# Patient Record
Sex: Female | Born: 1945
Health system: Southern US, Community
[De-identification: ages and names within clinical notes are randomized; demographics above are authoritative.]

## PROBLEM LIST (undated history)

## (undated) DIAGNOSIS — R51 Headache: Secondary | ICD-10-CM

## (undated) DIAGNOSIS — C801 Malignant (primary) neoplasm, unspecified: Secondary | ICD-10-CM

## (undated) DIAGNOSIS — K219 Gastro-esophageal reflux disease without esophagitis: Secondary | ICD-10-CM

## (undated) DIAGNOSIS — I10 Essential (primary) hypertension: Secondary | ICD-10-CM

## (undated) DIAGNOSIS — M1990 Unspecified osteoarthritis, unspecified site: Secondary | ICD-10-CM

## (undated) DIAGNOSIS — K625 Hemorrhage of anus and rectum: Secondary | ICD-10-CM

## (undated) DIAGNOSIS — F419 Anxiety disorder, unspecified: Secondary | ICD-10-CM

## (undated) DIAGNOSIS — Z923 Personal history of irradiation: Secondary | ICD-10-CM

## (undated) HISTORY — DX: Hemorrhage of anus and rectum: K62.5

## (undated) HISTORY — PX: EYE SURGERY: SHX253

## (undated) HISTORY — PX: TUBAL LIGATION: SHX77

---

## 2006-02-24 ENCOUNTER — Encounter: Admission: RE | Admit: 2006-02-24 | Discharge: 2006-02-24 | Payer: Self-pay | Admitting: Internal Medicine

## 2006-12-18 ENCOUNTER — Encounter: Admission: RE | Admit: 2006-12-18 | Discharge: 2006-12-18 | Payer: Self-pay | Admitting: Obstetrics and Gynecology

## 2007-07-23 ENCOUNTER — Encounter: Admission: RE | Admit: 2007-07-23 | Discharge: 2007-07-23 | Payer: Self-pay | Admitting: Orthopaedic Surgery

## 2008-01-13 ENCOUNTER — Encounter: Admission: RE | Admit: 2008-01-13 | Discharge: 2008-01-13 | Payer: Self-pay | Admitting: Internal Medicine

## 2008-07-11 ENCOUNTER — Encounter: Admission: RE | Admit: 2008-07-11 | Discharge: 2008-07-11 | Payer: Self-pay | Admitting: Internal Medicine

## 2009-02-02 ENCOUNTER — Encounter: Admission: RE | Admit: 2009-02-02 | Discharge: 2009-02-02 | Payer: Self-pay | Admitting: Internal Medicine

## 2010-02-27 ENCOUNTER — Encounter: Admission: RE | Admit: 2010-02-27 | Discharge: 2010-02-27 | Payer: Self-pay | Admitting: Internal Medicine

## 2010-09-03 ENCOUNTER — Encounter: Admission: RE | Admit: 2010-09-03 | Discharge: 2010-09-03 | Payer: Self-pay | Admitting: Internal Medicine

## 2011-03-04 ENCOUNTER — Other Ambulatory Visit: Payer: Self-pay | Admitting: Obstetrics and Gynecology

## 2011-03-04 DIAGNOSIS — Z1231 Encounter for screening mammogram for malignant neoplasm of breast: Secondary | ICD-10-CM

## 2011-03-13 ENCOUNTER — Ambulatory Visit
Admission: RE | Admit: 2011-03-13 | Discharge: 2011-03-13 | Disposition: A | Discharge status: Home / Self Care | Payer: BC Managed Care – PPO | Source: Ambulatory Visit | Attending: Obstetrics and Gynecology | Admitting: Obstetrics and Gynecology

## 2011-03-13 DIAGNOSIS — Z1231 Encounter for screening mammogram for malignant neoplasm of breast: Secondary | ICD-10-CM

## 2012-02-17 ENCOUNTER — Other Ambulatory Visit: Payer: Self-pay | Admitting: Internal Medicine

## 2012-02-17 DIAGNOSIS — Z1231 Encounter for screening mammogram for malignant neoplasm of breast: Secondary | ICD-10-CM

## 2012-03-17 ENCOUNTER — Ambulatory Visit
Admission: RE | Admit: 2012-03-17 | Discharge: 2012-03-17 | Disposition: A | Discharge status: Home / Self Care | Payer: Medicare Other | Source: Ambulatory Visit | Attending: Internal Medicine | Admitting: Internal Medicine

## 2012-03-17 DIAGNOSIS — Z1231 Encounter for screening mammogram for malignant neoplasm of breast: Secondary | ICD-10-CM

## 2013-03-02 ENCOUNTER — Other Ambulatory Visit: Payer: Self-pay | Admitting: Obstetrics and Gynecology

## 2013-03-02 ENCOUNTER — Other Ambulatory Visit (HOSPITAL_COMMUNITY)
Admission: RE | Admit: 2013-03-02 | Discharge: 2013-03-02 | Disposition: A | Discharge status: Home / Self Care | Payer: PRIVATE HEALTH INSURANCE | Source: Ambulatory Visit | Attending: Obstetrics and Gynecology | Admitting: Obstetrics and Gynecology

## 2013-03-02 DIAGNOSIS — Z1151 Encounter for screening for human papillomavirus (HPV): Secondary | ICD-10-CM | POA: Insufficient documentation

## 2013-03-02 DIAGNOSIS — Z01419 Encounter for gynecological examination (general) (routine) without abnormal findings: Secondary | ICD-10-CM | POA: Insufficient documentation

## 2013-05-02 ENCOUNTER — Other Ambulatory Visit: Payer: Self-pay

## 2013-05-02 DIAGNOSIS — Z1231 Encounter for screening mammogram for malignant neoplasm of breast: Secondary | ICD-10-CM

## 2013-05-26 ENCOUNTER — Ambulatory Visit
Admission: RE | Admit: 2013-05-26 | Discharge: 2013-05-26 | Disposition: A | Discharge status: Home / Self Care | Payer: PRIVATE HEALTH INSURANCE | Source: Ambulatory Visit

## 2013-05-26 DIAGNOSIS — Z1231 Encounter for screening mammogram for malignant neoplasm of breast: Secondary | ICD-10-CM

## 2014-04-19 ENCOUNTER — Other Ambulatory Visit: Payer: Self-pay

## 2014-04-19 DIAGNOSIS — Z1231 Encounter for screening mammogram for malignant neoplasm of breast: Secondary | ICD-10-CM

## 2014-05-29 ENCOUNTER — Ambulatory Visit
Admission: RE | Admit: 2014-05-29 | Discharge: 2014-05-29 | Disposition: A | Discharge status: Home / Self Care | Payer: PRIVATE HEALTH INSURANCE | Source: Ambulatory Visit

## 2014-05-29 DIAGNOSIS — Z1231 Encounter for screening mammogram for malignant neoplasm of breast: Secondary | ICD-10-CM

## 2014-07-05 ENCOUNTER — Ambulatory Visit (INDEPENDENT_AMBULATORY_CARE_PROVIDER_SITE_OTHER): Payer: Medicare Other | Admitting: Podiatry

## 2014-07-05 ENCOUNTER — Ambulatory Visit (INDEPENDENT_AMBULATORY_CARE_PROVIDER_SITE_OTHER): Payer: Medicare Other

## 2014-07-05 ENCOUNTER — Encounter: Payer: Self-pay | Admitting: Podiatry

## 2014-07-05 VITALS — BP 165/88 | HR 66 | Resp 16

## 2014-07-05 DIAGNOSIS — R52 Pain, unspecified: Secondary | ICD-10-CM

## 2014-07-05 DIAGNOSIS — M722 Plantar fascial fibromatosis: Secondary | ICD-10-CM

## 2014-07-05 DIAGNOSIS — M79609 Pain in unspecified limb: Secondary | ICD-10-CM

## 2014-07-05 MED ORDER — TRIAMCINOLONE ACETONIDE 10 MG/ML IJ SUSP
10.0000 mg | Freq: Once | INTRAMUSCULAR | Status: AC
  Administered 2014-07-05: 10 mg

## 2014-07-05 NOTE — Progress Notes (Signed)
Subjective:     Patient ID: Susan Randolph, female   DOB: [birth year], 68 y.o.   MRN: [id number]  Foot Pain   68 year old female presents with 2 month history of heel pain right that has intensified recently. Patient states that she's going on a trip and is unable to ambulate the way she wants to   Review of Systems  All other systems reviewed and are negative.      Objective:   Physical Exam  Nursing note and vitals reviewed. Constitutional: She is oriented to person, place, and time.  Cardiovascular: Intact distal pulses.   Musculoskeletal: Normal range of motion.  Neurological: She is oriented to person, place, and time.  Skin: Skin is warm.   neurovascular status found to be intact with muscle strength adequate and range of motion subtalar and midtarsal joint within normal limits. Patient's digits are found to be well perfused and arch height was normal upon weightbearing. Patient is noted to have intense discomfort at the insertion of the plantar fascia into the heel bone right     Assessment:     Plantar fasciitis acute nature right heel    Plan:     H&P and x-ray reviewed with patient. Injected the right plantar fascia 3 mg Kenalog 5 mg Xylocaine and applied fascially brace. Instructed on physical therapy and shoe gear usage and reappoint one week

## 2014-07-05 NOTE — Patient Instructions (Signed)

## 2014-07-05 NOTE — Progress Notes (Signed)
   Subjective:    Patient ID: Susan Randolph, female    DOB: 1946/05/02, 68 y.o.   MRN: [id number]  HPI Comments: "I have heel pain"  Patient c/o aching plantar heel right for about 1 month. She does have AM pain. She is getting ready to go on a trip that requires lots of walking. She has been using callus pad to take pressure off of the heel.      Review of Systems  HENT: Positive for sinus pressure.   Musculoskeletal: Positive for arthralgias, back pain, gait problem and myalgias.  Allergic/Immunologic: Positive for environmental allergies.  All other systems reviewed and are negative.      Objective:   Physical Exam        Assessment & Plan:

## 2014-07-13 ENCOUNTER — Ambulatory Visit (INDEPENDENT_AMBULATORY_CARE_PROVIDER_SITE_OTHER): Payer: Medicare Other | Admitting: Podiatry

## 2014-07-13 ENCOUNTER — Encounter: Payer: Self-pay | Admitting: Podiatry

## 2014-07-13 VITALS — BP 141/75 | HR 73 | Resp 16

## 2014-07-13 DIAGNOSIS — M722 Plantar fascial fibromatosis: Secondary | ICD-10-CM

## 2014-07-13 MED ORDER — TRIAMCINOLONE ACETONIDE 10 MG/ML IJ SUSP
10.0000 mg | Freq: Once | INTRAMUSCULAR | Status: AC
  Administered 2014-07-13: 10 mg

## 2014-07-14 NOTE — Progress Notes (Signed)
Subjective:     Patient ID: Susan Randolph, female   DOB: Jul 06, 1946, 68 y.o.   MRN: [id number]  HPI patient states that my right heel is improving but still has a sore spot   Review of Systems     Objective:   Physical Exam Neurovascular status unchanged with pain to palpation medial fascially band right at the insertional point of the tendon into the calcaneus    Assessment:     Plan her fasciitis right with inflammation    Plan:     H&P performed and did a careful injection of the 1 painful area 3 mg Kenalog 5 mg Xylocaine and advised him reduced activity and reappoint her recheck

## 2014-09-05 ENCOUNTER — Other Ambulatory Visit: Payer: Self-pay | Admitting: Internal Medicine

## 2014-09-05 DIAGNOSIS — R319 Hematuria, unspecified: Secondary | ICD-10-CM

## 2014-09-08 ENCOUNTER — Ambulatory Visit
Admission: RE | Admit: 2014-09-08 | Discharge: 2014-09-08 | Disposition: A | Discharge status: Home / Self Care | Payer: Medicare Other | Source: Ambulatory Visit | Attending: Internal Medicine | Admitting: Internal Medicine

## 2014-09-08 DIAGNOSIS — R319 Hematuria, unspecified: Secondary | ICD-10-CM

## 2014-10-16 ENCOUNTER — Encounter: Payer: Self-pay | Admitting: Podiatry

## 2014-10-16 ENCOUNTER — Ambulatory Visit (INDEPENDENT_AMBULATORY_CARE_PROVIDER_SITE_OTHER): Payer: Medicare Other | Admitting: Podiatry

## 2014-10-16 VITALS — BP 139/77 | HR 71 | Resp 16

## 2014-10-16 DIAGNOSIS — M722 Plantar fascial fibromatosis: Secondary | ICD-10-CM

## 2014-10-16 MED ORDER — TRIAMCINOLONE ACETONIDE 10 MG/ML IJ SUSP
10.0000 mg | Freq: Once | INTRAMUSCULAR | Status: AC
  Administered 2014-10-16: 10 mg

## 2014-10-16 NOTE — Progress Notes (Signed)
Subjective:     Patient ID: Susan Randolph, female   DOB: [birth year], 68 y.o.   MRN: [id number]  HPI patient is leaving for [place] in 10 days and states that her right heel has been extremely tender   Review of Systems     Objective:   Physical Exam Neurovascular status intact muscle strength adequate with exquisite discomfort plantar aspect right heel at the insertion of the tendon into the calcaneus    Assessment:     Significant plantar fasciitis right with patient getting ready to take long trip    Plan:     Discussed immobilization and dispensed air fracture walker with all instructions on usage and reinjected the plantar fascia 3 mg Kenalog 5 mg Xylocaine and gave instructions on physical therapy. Reappoint her recheck in about 4 weeks

## 2014-11-13 ENCOUNTER — Ambulatory Visit: Payer: Medicare Other | Admitting: Podiatry

## 2014-11-21 DIAGNOSIS — M79673 Pain in unspecified foot: Secondary | ICD-10-CM

## 2015-03-12 ENCOUNTER — Other Ambulatory Visit: Payer: Self-pay | Admitting: Obstetrics and Gynecology

## 2015-03-12 ENCOUNTER — Other Ambulatory Visit (HOSPITAL_COMMUNITY)
Admission: RE | Admit: 2015-03-12 | Discharge: 2015-03-12 | Disposition: A | Discharge status: Home / Self Care | Payer: Medicare Other | Source: Ambulatory Visit | Attending: Obstetrics and Gynecology | Admitting: Obstetrics and Gynecology

## 2015-03-12 DIAGNOSIS — Z124 Encounter for screening for malignant neoplasm of cervix: Secondary | ICD-10-CM | POA: Diagnosis present

## 2015-03-14 LAB — CYTOLOGY - PAP

## 2015-04-25 ENCOUNTER — Other Ambulatory Visit: Payer: Self-pay

## 2015-04-25 DIAGNOSIS — Z1231 Encounter for screening mammogram for malignant neoplasm of breast: Secondary | ICD-10-CM

## 2015-05-31 ENCOUNTER — Ambulatory Visit
Admission: RE | Admit: 2015-05-31 | Discharge: 2015-05-31 | Disposition: A | Discharge status: Home / Self Care | Payer: Medicare Other | Source: Ambulatory Visit

## 2015-05-31 DIAGNOSIS — Z1231 Encounter for screening mammogram for malignant neoplasm of breast: Secondary | ICD-10-CM

## 2016-01-21 ENCOUNTER — Encounter (HOSPITAL_COMMUNITY): Payer: Self-pay | Admitting: Emergency Medicine

## 2016-01-21 ENCOUNTER — Emergency Department (HOSPITAL_COMMUNITY): Payer: Medicare Other

## 2016-01-21 ENCOUNTER — Emergency Department (HOSPITAL_COMMUNITY)
Admission: EM | Admit: 2016-01-21 | Discharge: 2016-01-21 | Disposition: A | Discharge status: Home / Self Care | Payer: Medicare Other | Attending: Emergency Medicine | Admitting: Emergency Medicine

## 2016-01-21 DIAGNOSIS — Z79899 Other long term (current) drug therapy: Secondary | ICD-10-CM | POA: Insufficient documentation

## 2016-01-21 DIAGNOSIS — R51 Headache: Secondary | ICD-10-CM | POA: Diagnosis not present

## 2016-01-21 DIAGNOSIS — M542 Cervicalgia: Secondary | ICD-10-CM | POA: Diagnosis present

## 2016-01-21 DIAGNOSIS — M47892 Other spondylosis, cervical region: Secondary | ICD-10-CM | POA: Insufficient documentation

## 2016-01-21 DIAGNOSIS — M47812 Spondylosis without myelopathy or radiculopathy, cervical region: Secondary | ICD-10-CM

## 2016-01-21 LAB — CBC WITH DIFFERENTIAL/PLATELET
BASOS PCT: 0 %
Basophils Absolute: 0 10*3/uL (ref 0.0–0.1)
EOS ABS: 0.1 10*3/uL (ref 0.0–0.7)
Eosinophils Relative: 1 %
HEMATOCRIT: 40.6 % (ref 36.0–46.0)
HEMOGLOBIN: 13.9 g/dL (ref 12.0–15.0)
Lymphocytes Relative: 30 %
Lymphs Abs: 2.2 10*3/uL (ref 0.7–4.0)
MCH: 29.5 pg (ref 26.0–34.0)
MCHC: 34.2 g/dL (ref 30.0–36.0)
MCV: 86.2 fL (ref 78.0–100.0)
Monocytes Absolute: 0.4 10*3/uL (ref 0.1–1.0)
Monocytes Relative: 5 %
NEUTROS ABS: 4.7 10*3/uL (ref 1.7–7.7)
NEUTROS PCT: 64 %
Platelets: 245 10*3/uL (ref 150–400)
RBC: 4.71 MIL/uL (ref 3.87–5.11)
RDW: 12.1 % (ref 11.5–15.5)
WBC: 7.3 10*3/uL (ref 4.0–10.5)

## 2016-01-21 LAB — BASIC METABOLIC PANEL
ANION GAP: 10 (ref 5–15)
BUN: 15 mg/dL (ref 6–20)
CHLORIDE: 103 mmol/L (ref 101–111)
CO2: 26 mmol/L (ref 22–32)
CREATININE: 0.79 mg/dL (ref 0.44–1.00)
Calcium: 10.1 mg/dL (ref 8.9–10.3)
GFR calc non Af Amer: >60 mL/min (ref >60)
Glucose, Bld: 98 mg/dL (ref 65–99)
POTASSIUM: 3.8 mmol/L (ref 3.5–5.1)
SODIUM: 139 mmol/L (ref 135–145)

## 2016-01-21 MED ORDER — HYDROMORPHONE HCL 1 MG/ML IJ SOLN: 0.5000 mg | Freq: Once | INTRAMUSCULAR | Status: DC

## 2016-01-21 MED ORDER — CYCLOBENZAPRINE HCL 10 MG PO TABS: 10.0000 mg | ORAL_TABLET | Freq: Two times a day (BID) | ORAL | Status: DC | PRN

## 2016-01-21 MED ORDER — PREDNISONE 10 MG (21) PO TBPK: 10.0000 mg | ORAL_TABLET | Freq: Every day | ORAL | Status: DC

## 2016-01-21 MED ORDER — OXYCODONE-ACETAMINOPHEN 5-325 MG PO TABS
1.0000 | ORAL_TABLET | Freq: Once | ORAL | Status: AC
Start: 2016-01-21 — End: 2016-01-21
  Administered 2016-01-21: 1 via ORAL
  Filled 2016-01-21: qty 1

## 2016-01-21 MED ORDER — IOPAMIDOL (ISOVUE-370) INJECTION 76%
100.0000 mL | Freq: Once | INTRAVENOUS | Status: AC | PRN
  Administered 2016-01-21: 100 mL via INTRAVENOUS

## 2016-01-21 MED ORDER — CYCLOBENZAPRINE HCL 10 MG PO TABS
10.0000 mg | ORAL_TABLET | Freq: Once | ORAL | Status: AC
  Administered 2016-01-21: 10 mg via ORAL
  Filled 2016-01-21: qty 1

## 2016-01-21 MED ORDER — OXYCODONE-ACETAMINOPHEN 5-325 MG PO TABS: 1.0000 | ORAL_TABLET | Freq: Three times a day (TID) | ORAL | Status: DC | PRN

## 2016-01-21 MED ORDER — HYDROMORPHONE HCL 1 MG/ML IJ SOLN
0.5000 mg | INTRAMUSCULAR | Status: AC | PRN
  Administered 2016-01-21 (×2): 0.5 mg via INTRAVENOUS
  Filled 2016-01-21 (×2): qty 1
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
## 2016-01-21 NOTE — Discharge Instructions (Signed)
Your imaging shows severe arthritis involving your upper neck. Given follow-up contact information listed above. Continue taking medications as needed for pain control. Return without worsening symptoms, including worsening pain, new vision or speech changes, difficulty walking, numbness or weakness of the arm or leg, or any other symptoms concerning to you.  Headache and Arthritis If you have arthritis and headaches, it is possible the two problems are related. Some headaches can be caused by arthritis in your neck (cervicogenic headaches).  Pain medicine is another possible link between arthritis and headache. If you take a lot of over-the-counter medicines for arthritis pain, you may develop the type of headache that can happen when you stop taking your over-the-counter pain reliever or lower your dose too quickly (rebound headache).  WHAT TYPES OF ARTHRITIS CAN CAUSE A HEADACHE? There are two types of arthritis, rheumatoid arthritis and osteoarthritis. Both types of arthritis can cause headaches.   Rheumatoid arthritis (RA) is an autoimmune disease that causes inflammation of your joints. When you have RA, your body's defense system (immune system) attacks the joints of your body and causes inflammation. This can lead to deformity over time.  Osteoarthritis (OA) is wear and tear caused by joint use over time. Osteoarthritis is not an inflammatory disease. Both OA and RA can cause neck pain that is felt in the head. When the pain is felt in a different location than it originates, it is called radiating or referred pain. This pain is usually felt in the back of the head.  HOW ARE HEADACHES AND ARTHRITIS RELATED? RA can affect any joint in the body, including the joints between the bones of the neck (cervical vertebrae). The neck joints most commonly affected by RA are the top two joints, between the first and second cervical vertebra. Inflammation in these joints may be felt as neck pain and head  pain. OA of the neck may be caused by gradual wear and tear or by a neck injury. Neck vertebrae may develop calcium deposits in the areas where muscle attach. Wear and tear of the vertebra may cause pressure on the nerves that leave the spinal cord. These changes can cause referred pain that may be felt as a headache. HOW ARE HEADACHES ASSOCIATED WITH ARTHRITIS DIAGNOSED?  Your health care provider may diagnose headache caused by RA if you have inflammation of vertebrae in your neck. You may have:  Blood tests to measure how much inflammation you have.  Imaging studies of your neck (MRI) to check for inflammation of cervical vertebrae.  Your health care provider may diagnose headache caused by OA if an X-ray shows:  Calcium deposits.  Bone spurs.  Narrowing of the space between neck vertebrae.  Your health care provider may diagnose rebound headache if you have a history of using over-the-counter pain relievers frequently. WHEN SHOULD I SEEK CARE FOR MY HEADACHES? Call your health care provider if:  You have more than three headaches per week.  You take an over-the-counter pain reliever almost every day.  Your headaches are getting worse and happening more often.  You have headache with fever.  You have headache with numbness, weakness, or dizziness.  You have headache with nausea or vomiting. WHAT ARE MY TREATMENT OPTIONS?  If you have headache caused by RA, treatment may include:  Over-the-counter or prescription-strength anti-inflammatory medicines.  Disease-modifying antirheumatic drugs (DMARDs). These medicines slow or stop the progression of RA.  If you have headache caused by OA, treatment may include:  Over-the-counter pain medicines.  Heat or massage.  Physical therapy.  If you have rebound headaches:  They will usually go away within several days of stopping the medicine that caused them.  You may be able to gradually reduce the amount of medicines you  take to prevent headache.  Ask your health care provider if you can take another type of medicine instead.   This information is not intended to replace advice given to you by your health care provider. Make sure you discuss any questions you have with your health care provider.   Document Released: 12/27/2003 Document Revised: 10/27/2014 Document Reviewed: 01/09/2014 Elsevier Interactive Patient Education [employer].
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
## 2016-01-21 NOTE — ED Notes (Signed)
Pt being sent by PCP c/o intermittent headache and neck pain x "a couple days."  Pt given 80mg  Depo-Medrol in office.

## 2016-05-08 ENCOUNTER — Other Ambulatory Visit: Payer: Self-pay | Admitting: Internal Medicine

## 2016-05-08 DIAGNOSIS — Z1231 Encounter for screening mammogram for malignant neoplasm of breast: Secondary | ICD-10-CM

## 2016-06-05 ENCOUNTER — Ambulatory Visit
Admission: RE | Admit: 2016-06-05 | Discharge: 2016-06-05 | Disposition: A | Discharge status: Home / Self Care | Payer: Medicare Other | Source: Ambulatory Visit | Attending: Internal Medicine | Admitting: Internal Medicine

## 2016-06-05 DIAGNOSIS — Z1231 Encounter for screening mammogram for malignant neoplasm of breast: Secondary | ICD-10-CM

## 2016-09-19 DIAGNOSIS — K625 Hemorrhage of anus and rectum: Secondary | ICD-10-CM

## 2016-09-19 HISTORY — DX: Hemorrhage of anus and rectum: K62.5

## 2016-11-07 ENCOUNTER — Other Ambulatory Visit: Payer: Self-pay | Admitting: Gastroenterology

## 2016-11-07 DIAGNOSIS — K625 Hemorrhage of anus and rectum: Secondary | ICD-10-CM

## 2016-11-11 ENCOUNTER — Other Ambulatory Visit: Payer: Self-pay | Admitting: Internal Medicine

## 2016-11-11 DIAGNOSIS — K625 Hemorrhage of anus and rectum: Secondary | ICD-10-CM

## 2016-11-13 ENCOUNTER — Ambulatory Visit
Admission: RE | Admit: 2016-11-13 | Discharge: 2016-11-13 | Disposition: A | Discharge status: Home / Self Care | Payer: Medicare Other | Source: Ambulatory Visit | Attending: Gastroenterology | Admitting: Gastroenterology

## 2016-11-13 DIAGNOSIS — K625 Hemorrhage of anus and rectum: Secondary | ICD-10-CM

## 2016-11-13 MED ORDER — IOPAMIDOL (ISOVUE-370) INJECTION 76%
75.0000 mL | Freq: Once | INTRAVENOUS | Status: AC | PRN
  Administered 2016-11-13: 75 mL via INTRAVENOUS

## 2016-12-29 DIAGNOSIS — K219 Gastro-esophageal reflux disease without esophagitis: Secondary | ICD-10-CM | POA: Insufficient documentation

## 2016-12-29 DIAGNOSIS — I10 Essential (primary) hypertension: Secondary | ICD-10-CM | POA: Insufficient documentation

## 2016-12-29 DIAGNOSIS — M19042 Primary osteoarthritis, left hand: Secondary | ICD-10-CM

## 2016-12-29 DIAGNOSIS — Z8639 Personal history of other endocrine, nutritional and metabolic disease: Secondary | ICD-10-CM | POA: Insufficient documentation

## 2016-12-29 DIAGNOSIS — Z85828 Personal history of other malignant neoplasm of skin: Secondary | ICD-10-CM | POA: Insufficient documentation

## 2016-12-29 DIAGNOSIS — M19041 Primary osteoarthritis, right hand: Secondary | ICD-10-CM | POA: Insufficient documentation

## 2016-12-29 DIAGNOSIS — G2581 Restless legs syndrome: Secondary | ICD-10-CM | POA: Insufficient documentation

## 2016-12-29 DIAGNOSIS — Z8659 Personal history of other mental and behavioral disorders: Secondary | ICD-10-CM | POA: Insufficient documentation

## 2016-12-29 DIAGNOSIS — J309 Allergic rhinitis, unspecified: Secondary | ICD-10-CM | POA: Insufficient documentation

## 2016-12-29 DIAGNOSIS — M47816 Spondylosis without myelopathy or radiculopathy, lumbar region: Secondary | ICD-10-CM | POA: Insufficient documentation

## 2016-12-29 DIAGNOSIS — M2550 Pain in unspecified joint: Secondary | ICD-10-CM | POA: Insufficient documentation

## 2016-12-29 DIAGNOSIS — M47812 Spondylosis without myelopathy or radiculopathy, cervical region: Secondary | ICD-10-CM | POA: Insufficient documentation

## 2017-01-01 ENCOUNTER — Ambulatory Visit (INDEPENDENT_AMBULATORY_CARE_PROVIDER_SITE_OTHER): Payer: Medicare Other

## 2017-01-01 ENCOUNTER — Ambulatory Visit (INDEPENDENT_AMBULATORY_CARE_PROVIDER_SITE_OTHER): Payer: Medicare Other | Admitting: Rheumatology

## 2017-01-01 ENCOUNTER — Encounter: Payer: Self-pay | Admitting: Rheumatology

## 2017-01-01 ENCOUNTER — Ambulatory Visit (INDEPENDENT_AMBULATORY_CARE_PROVIDER_SITE_OTHER): Payer: Self-pay

## 2017-01-01 VITALS — BP 156/94 | HR 71 | Resp 12 | Ht 65.0 in | Wt 147.0 lb

## 2017-01-01 DIAGNOSIS — M47812 Spondylosis without myelopathy or radiculopathy, cervical region: Secondary | ICD-10-CM

## 2017-01-01 DIAGNOSIS — Z8639 Personal history of other endocrine, nutritional and metabolic disease: Secondary | ICD-10-CM

## 2017-01-01 DIAGNOSIS — K219 Gastro-esophageal reflux disease without esophagitis: Secondary | ICD-10-CM | POA: Diagnosis not present

## 2017-01-01 DIAGNOSIS — G2581 Restless legs syndrome: Secondary | ICD-10-CM | POA: Diagnosis not present

## 2017-01-01 DIAGNOSIS — J302 Other seasonal allergic rhinitis: Secondary | ICD-10-CM | POA: Diagnosis not present

## 2017-01-01 DIAGNOSIS — G8929 Other chronic pain: Secondary | ICD-10-CM

## 2017-01-01 DIAGNOSIS — Z85828 Personal history of other malignant neoplasm of skin: Secondary | ICD-10-CM

## 2017-01-01 DIAGNOSIS — M25551 Pain in right hip: Secondary | ICD-10-CM

## 2017-01-01 DIAGNOSIS — Z8659 Personal history of other mental and behavioral disorders: Secondary | ICD-10-CM | POA: Diagnosis not present

## 2017-01-01 DIAGNOSIS — M2550 Pain in unspecified joint: Secondary | ICD-10-CM

## 2017-01-01 DIAGNOSIS — M542 Cervicalgia: Secondary | ICD-10-CM | POA: Diagnosis not present

## 2017-01-01 DIAGNOSIS — I10 Essential (primary) hypertension: Secondary | ICD-10-CM

## 2017-01-01 DIAGNOSIS — M19041 Primary osteoarthritis, right hand: Secondary | ICD-10-CM

## 2017-01-01 DIAGNOSIS — M47816 Spondylosis without myelopathy or radiculopathy, lumbar region: Secondary | ICD-10-CM

## 2017-01-01 DIAGNOSIS — M545 Low back pain: Secondary | ICD-10-CM | POA: Diagnosis not present

## 2017-01-01 DIAGNOSIS — M5030 Other cervical disc degeneration, unspecified cervical region: Secondary | ICD-10-CM

## 2017-01-01 DIAGNOSIS — M19042 Primary osteoarthritis, left hand: Secondary | ICD-10-CM
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
## 2017-01-01 NOTE — Patient Instructions (Signed)
Supplements for OA Natural anti-inflammatories  You can purchase these at [employer], [employer] or online.  . Turmeric (capsules)  . Ginger (ginger root or capsules)  . Omega 3 (Fish, flax seeds, chia seeds, walnuts, almonds)  . Tart cherry (dried or extract)   Patient should be under the care of a physician while taking these supplements. This may not be reproduced without the   permission of Dr. Bo Merino.   Trochanteric Bursitis Trochanteric bursitis is a condition that causes hip pain. Trochanteric bursitis happens when fluid-filled sacs (bursae) in the hip get irritated. Normally these sacs absorb shock and help strong bands of tissue (tendons) in your hip glide smoothly over each other and over your hip bones. What are the causes? This condition results from increased friction between the hip bones and the tendons that go over them. This condition can happen if you:  Have weak hips.  Use your hip muscles too much (overuse).  Get hit in the hip. What increases the risk? This condition is more likely to develop in:  Women.  Adults who are middle-aged or older.  People with arthritis or a spinal condition.  People with weak buttocks muscles (gluteal muscles).  People who have one leg that is shorter than the other.  People who participate in certain kinds of athletic activities, such as:  Running sports, especially long-distance running.  Contact sports, like football or martial arts.  Sports in which falls may occur, like skiing. What are the signs or symptoms? The main symptom of this condition is pain and tenderness over the point of your hip. The pain may be:  Sharp and intense.  Dull and achy.  Felt on the outside of your thigh. It may increase when you:  Lie on your side.  Walk or run.  Go up on stairs.  Sit.  Stand up after sitting.  Stand for long periods of time. How is this diagnosed? This condition may be diagnosed based  on:  Your symptoms.  Your medical history.  A physical exam.  Imaging tests, such as:  X-rays to check your bones.  An MRI or ultrasound to check your tendons and muscles. During your physical exam, your health care provider will check the movement and strength of your hip. He or she may press on the point of your hip to check for pain. How is this treated? This condition may be treated by:  Resting.  Reducing your activity.  Avoiding activities that cause pain.  Using crutches, a cane, or a walker to decrease the strain on your hip.  Taking medicine to help with swelling.  Having medicine injected into the bursae to help with swelling.  Using ice, heat, and massage therapy for pain relief.  Physical therapy exercises for strength and flexibility.  Surgery (rare). Follow these instructions at home: Activity   Rest.  Avoid activities that cause pain.  Return to your normal activities as told by your health care provider. Ask your health care provider what activities are safe for you. Managing pain, stiffness, and swelling   Take over-the-counter and prescription medicines only as told by your health care provider.  If directed, apply heat to the injured area as told by your health care provider.  Place a towel between your skin and the heat source.  Leave the heat on for 20-30 minutes.  Remove the heat if your skin turns bright red. This is especially important if you are unable to feel pain, heat, or cold. You  may have a greater risk of getting burned.  If directed, apply ice to the injured area:  Put ice in a plastic bag.  Place a towel between your skin and the bag.  Leave the ice on for 20 minutes, 2-3 times a day. General instructions   If the affected leg is one that you use for driving, ask your health care provider when it is safe to drive.  Use crutches, a cane, or a walker as told by your health care provider.  If one of your legs is shorter  than the other, get fitted for a shoe insert.  Lose weight if you are overweight. How is this prevented?  Wear supportive footwear that is appropriate for your sport.  If you have hip pain, start any new exercise or sport slowly.  Maintain physical fitness, including:  Strength.  Flexibility. Contact a health care provider if:  Your pain does not improve with 2-4 weeks. Get help right away if:  You develop severe pain.  You have a fever.  You develop increased redness over your hip.  You have a change in your bowel function or bladder function.  You cannot control the muscles in your feet. This information is not intended to replace advice given to you by your health care provider. Make sure you discuss any questions you have with your health care provider. Document Released: 11/13/2004 Document Revised: 06/11/2016 Document Reviewed: 09/21/2015 Elsevier Interactive Patient Education  2017 [employer].

## 2017-02-06 ENCOUNTER — Ambulatory Visit: Payer: Self-pay | Admitting: Rheumatology

## 2017-04-06 ENCOUNTER — Other Ambulatory Visit: Payer: Self-pay | Admitting: Obstetrics and Gynecology

## 2017-04-06 ENCOUNTER — Other Ambulatory Visit (HOSPITAL_COMMUNITY)
Admission: RE | Admit: 2017-04-06 | Discharge: 2017-04-06 | Disposition: A | Discharge status: Home / Self Care | Payer: Medicare Other | Source: Ambulatory Visit | Attending: Obstetrics and Gynecology | Admitting: Obstetrics and Gynecology

## 2017-04-06 DIAGNOSIS — Z124 Encounter for screening for malignant neoplasm of cervix: Secondary | ICD-10-CM | POA: Diagnosis present

## 2017-04-09 LAB — CYTOLOGY - PAP
Diagnosis: NEGATIVE
HPV: NOT DETECTED

## 2017-04-27 ENCOUNTER — Other Ambulatory Visit: Payer: Self-pay | Admitting: Internal Medicine

## 2017-04-27 DIAGNOSIS — Z1231 Encounter for screening mammogram for malignant neoplasm of breast: Secondary | ICD-10-CM

## 2017-06-08 ENCOUNTER — Ambulatory Visit
Admission: RE | Admit: 2017-06-08 | Discharge: 2017-06-08 | Disposition: A | Discharge status: Home / Self Care | Payer: Medicare Other | Source: Ambulatory Visit | Attending: Internal Medicine | Admitting: Internal Medicine

## 2017-06-08 DIAGNOSIS — Z1231 Encounter for screening mammogram for malignant neoplasm of breast: Secondary | ICD-10-CM

## 2017-07-22 ENCOUNTER — Other Ambulatory Visit: Payer: Self-pay | Admitting: Obstetrics and Gynecology

## 2017-07-22 DIAGNOSIS — R310 Gross hematuria: Secondary | ICD-10-CM

## 2017-07-27 ENCOUNTER — Ambulatory Visit
Admission: RE | Admit: 2017-07-27 | Discharge: 2017-07-27 | Disposition: A | Discharge status: Home / Self Care | Payer: Medicare Other | Source: Ambulatory Visit | Attending: Obstetrics and Gynecology | Admitting: Obstetrics and Gynecology

## 2017-07-27 DIAGNOSIS — R310 Gross hematuria: Secondary | ICD-10-CM

## 2017-07-27 MED ORDER — IOPAMIDOL (ISOVUE-300) INJECTION 61%
125.0000 mL | Freq: Once | INTRAVENOUS | Status: AC | PRN
  Administered 2017-07-27: 125 mL via INTRAVENOUS

## 2017-11-16 ENCOUNTER — Other Ambulatory Visit: Payer: Self-pay | Admitting: Internal Medicine

## 2017-11-16 DIAGNOSIS — M542 Cervicalgia: Secondary | ICD-10-CM

## 2017-11-18 ENCOUNTER — Ambulatory Visit
Admission: RE | Admit: 2017-11-18 | Discharge: 2017-11-18 | Disposition: A | Discharge status: Home / Self Care | Payer: Medicare Other | Source: Ambulatory Visit | Attending: Internal Medicine | Admitting: Internal Medicine

## 2017-11-18 DIAGNOSIS — M542 Cervicalgia: Secondary | ICD-10-CM

## 2017-11-23 ENCOUNTER — Other Ambulatory Visit: Payer: Self-pay | Admitting: Internal Medicine

## 2017-11-23 DIAGNOSIS — M542 Cervicalgia: Secondary | ICD-10-CM

## 2017-11-26 ENCOUNTER — Other Ambulatory Visit: Payer: Medicare Other

## 2017-12-09 ENCOUNTER — Other Ambulatory Visit: Payer: Self-pay | Admitting: Neurosurgery

## 2018-01-01 NOTE — Pre-Procedure Instructions (Signed)
Jimya M Critcher  01/01/2018      [id number] - [place], [place] - [address] AT [address] [place]. [place] [address] Phone: [phone] Fax: [phone]    Your procedure is scheduled on Monday, March 25  Report to [facility] Admitting at 6:00 A.M.  Call this number if you have problems the morning of surgery:  [phone]   Remember:  Do not eat food or drink liquids after midnight on Sun,March 24   Take these medicines the morning of surgery with A SIP OF WATER : tylenol if needed,alprazolam (xanax) if needed, zrytec if needed, famotidine (pepcid), flonase if needed, gabapentin (neurontin) if needed                 7 days prior to surgery STOP taking any Aspirin(unless otherwise instructed by your surgeon), Aleve, Naproxen, Ibuprofen, Motrin, Advil, Goody's, BC's, all herbal medications, fish oil, and all vitamins   Do not wear jewelry, make-up or nail polish.  Do not wear lotions, powders, or perfumes, or deodorant.  Do not shave 48 hours prior to surgery.  Men may shave face and neck.  Do not bring valuables to the hospital.  [facility] is not responsible for any belongings or valuables.  Contacts, dentures or bridgework may not be worn into surgery.  Leave your suitcase in the car.  After surgery it may be brought to your room.  For patients admitted to the hospital, discharge time will be determined by your treatment team.  Patients discharged the day of surgery will not be allowed to drive home.    Special instructions:  - Preparing For Surgery  Before surgery, you can play an important role. Because skin is not sterile, your skin needs to be as free of germs as possible. You can reduce the number of germs on your skin by washing with CHG (chlorahexidine gluconate) Soap before surgery.  CHG is an antiseptic cleaner which kills germs and bonds with the skin to continue killing germs even  after washing.  Please do not use if you have an allergy to CHG or antibacterial soaps. If your skin becomes reddened/irritated stop using the CHG.  Do not shave (including legs and underarms) for at least 48 hours prior to first CHG shower. It is OK to shave your face.  Please follow these instructions carefully.   1. Shower the NIGHT BEFORE SURGERY and the MORNING OF SURGERY with CHG.   2. If you chose to wash your hair, wash your hair first as usual with your normal shampoo.  3. After you shampoo, rinse your hair and body thoroughly to remove the shampoo.  4. Use CHG as you would any other liquid soap. You can apply CHG directly to the skin and wash gently with a scrungie or a clean washcloth.   5. Apply the CHG Soap to your body ONLY FROM THE NECK DOWN.  Do not use on open wounds or open sores. Avoid contact with your eyes, ears, mouth and genitals (private parts). Wash Face and genitals (private parts)  with your normal soap.  6. Wash thoroughly, paying special attention to the area where your surgery will be performed.  7. Thoroughly rinse your body with warm water from the neck down.  8. DO NOT shower/wash with your normal soap after using and rinsing off the CHG Soap.  9. Pat yourself dry with a CLEAN TOWEL.  10. Wear CLEAN PAJAMAS to bed  the night before surgery, wear comfortable clothes the morning of surgery  11. Place CLEAN SHEETS on your bed the night of your first shower and DO NOT SLEEP WITH PETS.    Day of Surgery: Do not apply any deodorants/lotions. Please wear clean clothes to the hospital/surgery center.      Please read over the following fact sheets that you were given. Coughing and Deep Breathing, MRSA Information and Surgical Site Infection Prevention

## 2018-01-04 ENCOUNTER — Other Ambulatory Visit: Payer: Self-pay

## 2018-01-04 ENCOUNTER — Encounter (HOSPITAL_COMMUNITY)
Admission: RE | Admit: 2018-01-04 | Discharge: 2018-01-04 | Disposition: A | Discharge status: Home / Self Care | Payer: Medicare Other | Source: Ambulatory Visit | Attending: Neurosurgery | Admitting: Neurosurgery

## 2018-01-04 ENCOUNTER — Encounter (HOSPITAL_COMMUNITY): Payer: Self-pay

## 2018-01-04 DIAGNOSIS — Z01818 Encounter for other preprocedural examination: Secondary | ICD-10-CM | POA: Insufficient documentation

## 2018-01-04 DIAGNOSIS — I10 Essential (primary) hypertension: Secondary | ICD-10-CM | POA: Insufficient documentation

## 2018-01-04 HISTORY — DX: Anxiety disorder, unspecified: F41.9

## 2018-01-04 HISTORY — DX: Headache: R51

## 2018-01-04 HISTORY — DX: Malignant (primary) neoplasm, unspecified: C80.1

## 2018-01-04 HISTORY — DX: Gastro-esophageal reflux disease without esophagitis: K21.9

## 2018-01-04 HISTORY — DX: Unspecified osteoarthritis, unspecified site: M19.90

## 2018-01-04 HISTORY — DX: Essential (primary) hypertension: I10

## 2018-01-04 LAB — BASIC METABOLIC PANEL
Anion gap: 10 (ref 5–15)
BUN: 21 mg/dL — AB (ref 6–20)
CALCIUM: 9.5 mg/dL (ref 8.9–10.3)
CO2: 23 mmol/L (ref 22–32)
Chloride: 104 mmol/L (ref 101–111)
Creatinine, Ser: 0.76 mg/dL (ref 0.44–1.00)
GFR calc Af Amer: >60 mL/min (ref >60)
GLUCOSE: 85 mg/dL (ref 65–99)
POTASSIUM: 3.7 mmol/L (ref 3.5–5.1)
SODIUM: 137 mmol/L (ref 135–145)

## 2018-01-04 LAB — CBC WITH DIFFERENTIAL/PLATELET
BASOS ABS: 0 10*3/uL (ref 0.0–0.1)
Basophils Relative: 0 %
EOS ABS: 0.1 10*3/uL (ref 0.0–0.7)
Eosinophils Relative: 2 %
HCT: 36.2 % (ref 36.0–46.0)
Hemoglobin: 11.9 g/dL — ABNORMAL LOW (ref 12.0–15.0)
LYMPHS PCT: 37 %
Lymphs Abs: 2.4 10*3/uL (ref 0.7–4.0)
MCH: 30.1 pg (ref 26.0–34.0)
MCHC: 32.9 g/dL (ref 30.0–36.0)
MCV: 91.6 fL (ref 78.0–100.0)
MONO ABS: 0.3 10*3/uL (ref 0.1–1.0)
Monocytes Relative: 5 %
Neutro Abs: 3.6 10*3/uL (ref 1.7–7.7)
Neutrophils Relative %: 56 %
Platelets: 231 10*3/uL (ref 150–400)
RBC: 3.95 MIL/uL (ref 3.87–5.11)
RDW: 12.5 % (ref 11.5–15.5)
WBC: 6.4 10*3/uL (ref 4.0–10.5)

## 2018-01-04 LAB — ABO/RH: ABO/RH(D): O NEG

## 2018-01-04 LAB — TYPE AND SCREEN
ABO/RH(D): O NEG
Antibody Screen: NEGATIVE

## 2018-01-04 LAB — SURGICAL PCR SCREEN
MRSA, PCR: NEGATIVE
STAPHYLOCOCCUS AUREUS: NEGATIVE

## 2018-01-04 NOTE — Progress Notes (Signed)
PCP - Dr. Josetta Huddle Cardiologist - patient denies  Chest x-ray - n/a EKG - 01/04/2018 Stress Test - patient denies ECHO - patient denies Cardiac Cath - patient denies  Sleep Study - patient denies  Anesthesia review: n/a  Patient denies shortness of breath, fever, cough and chest pain at PAT appointment   Patient verbalized understanding of instructions that were given to them at the PAT appointment. Patient was also instructed that they will need to review over the PAT instructions again at home before surgery.

## 2018-01-06 ENCOUNTER — Ambulatory Visit: Payer: Medicare Other | Admitting: Diagnostic Neuroimaging

## 2018-01-10 ENCOUNTER — Encounter (HOSPITAL_COMMUNITY): Payer: Self-pay | Admitting: Anesthesiology

## 2018-01-10 NOTE — Anesthesia Preprocedure Evaluation (Addendum)
Anesthesia Evaluation  Patient identified by MRN, date of birth, ID band Patient awake    Reviewed: Allergy & Precautions, NPO status , Patient's Chart, lab work & pertinent test results  Airway Mallampati: I       Dental no notable dental hx. (+) Teeth Intact   Pulmonary neg pulmonary ROS,    Pulmonary exam normal breath sounds clear to auscultation       Cardiovascular hypertension, Pt. on medications Normal cardiovascular exam Rhythm:Regular Rate:Normal     Neuro/Psych PSYCHIATRIC DISORDERS Anxiety    GI/Hepatic Neg liver ROS,   Endo/Other  negative endocrine ROS  Renal/GU negative Renal ROS  negative genitourinary   Musculoskeletal   Abdominal Normal abdominal exam  (+)   Peds  Hematology negative hematology ROS (+)   Anesthesia Other Findings   Reproductive/Obstetrics                            Anesthesia Physical Anesthesia Plan  ASA: II  Anesthesia Plan: General   Post-op Pain Management:    Induction: Intravenous  PONV Risk Score and Plan: 4 or greater and Ondansetron, Dexamethasone and Midazolam  Airway Management Planned: Oral ETT  Additional Equipment:   Intra-op Plan:   Post-operative Plan: Extubation in OR  Informed Consent: I have reviewed the patients History and Physical, chart, labs and discussed the procedure including the risks, benefits and alternatives for the proposed anesthesia with the patient or authorized representative who has indicated his/her understanding and acceptance.   Dental advisory given  Plan Discussed with: CRNA and Surgeon  Anesthesia Plan Comments:        Anesthesia Quick Evaluation

## 2018-01-11 ENCOUNTER — Encounter (HOSPITAL_COMMUNITY): Payer: Self-pay | Admitting: General Practice

## 2018-01-11 ENCOUNTER — Inpatient Hospital Stay (HOSPITAL_COMMUNITY)
Admission: RE | Admit: 2018-01-11 | Discharge: 2018-01-14 | DRG: 473 | Disposition: A | Discharge status: Home / Self Care | Payer: Medicare Other | Source: Ambulatory Visit | Attending: Neurosurgery | Admitting: Neurosurgery

## 2018-01-11 ENCOUNTER — Inpatient Hospital Stay (HOSPITAL_COMMUNITY)
Admission: RE | Disposition: A | Discharge status: Home / Self Care | Payer: Self-pay | Source: Ambulatory Visit | Attending: Neurosurgery

## 2018-01-11 ENCOUNTER — Ambulatory Visit (HOSPITAL_COMMUNITY): Payer: Medicare Other | Admitting: Anesthesiology

## 2018-01-11 ENCOUNTER — Ambulatory Visit (HOSPITAL_COMMUNITY): Payer: Medicare Other

## 2018-01-11 ENCOUNTER — Other Ambulatory Visit: Payer: Self-pay

## 2018-01-11 DIAGNOSIS — Z882 Allergy status to sulfonamides status: Secondary | ICD-10-CM

## 2018-01-11 DIAGNOSIS — F419 Anxiety disorder, unspecified: Secondary | ICD-10-CM | POA: Diagnosis present

## 2018-01-11 DIAGNOSIS — M4692 Unspecified inflammatory spondylopathy, cervical region: Secondary | ICD-10-CM | POA: Diagnosis present

## 2018-01-11 DIAGNOSIS — R51 Headache: Secondary | ICD-10-CM | POA: Diagnosis not present

## 2018-01-11 DIAGNOSIS — M5481 Occipital neuralgia: Secondary | ICD-10-CM | POA: Diagnosis present

## 2018-01-11 DIAGNOSIS — Z888 Allergy status to other drugs, medicaments and biological substances status: Secondary | ICD-10-CM

## 2018-01-11 DIAGNOSIS — R112 Nausea with vomiting, unspecified: Secondary | ICD-10-CM | POA: Diagnosis not present

## 2018-01-11 DIAGNOSIS — M47812 Spondylosis without myelopathy or radiculopathy, cervical region: Secondary | ICD-10-CM | POA: Diagnosis present

## 2018-01-11 DIAGNOSIS — M47811 Spondylosis without myelopathy or radiculopathy, occipito-atlanto-axial region: Secondary | ICD-10-CM | POA: Diagnosis present

## 2018-01-11 DIAGNOSIS — I10 Essential (primary) hypertension: Secondary | ICD-10-CM | POA: Diagnosis present

## 2018-01-11 DIAGNOSIS — M479 Spondylosis, unspecified: Secondary | ICD-10-CM | POA: Diagnosis present

## 2018-01-11 DIAGNOSIS — Z79899 Other long term (current) drug therapy: Secondary | ICD-10-CM

## 2018-01-11 DIAGNOSIS — Z419 Encounter for procedure for purposes other than remedying health state, unspecified: Secondary | ICD-10-CM

## 2018-01-11 DIAGNOSIS — K219 Gastro-esophageal reflux disease without esophagitis: Secondary | ICD-10-CM | POA: Diagnosis present

## 2018-01-11 HISTORY — PX: POSTERIOR CERVICAL FUSION/FORAMINOTOMY: SHX5038

## 2018-01-11 SURGERY — POSTERIOR CERVICAL FUSION/FORAMINOTOMY LEVEL 1
Anesthesia: General | Site: Spine Cervical

## 2018-01-11 MED ORDER — HYDROMORPHONE HCL 1 MG/ML IJ SOLN
INTRAMUSCULAR | Status: AC
  Administered 2018-01-11: 0.5 mg via INTRAVENOUS
  Filled 2018-01-11: qty 1

## 2018-01-11 MED ORDER — FENTANYL CITRATE (PF) 250 MCG/5ML IJ SOLN
INTRAMUSCULAR | Status: AC
  Filled 2018-01-11: qty 5

## 2018-01-11 MED ORDER — CHLORHEXIDINE GLUCONATE CLOTH 2 % EX PADS: 6.0000 | MEDICATED_PAD | Freq: Once | CUTANEOUS | Status: DC

## 2018-01-11 MED ORDER — ROCURONIUM BROMIDE 100 MG/10ML IV SOLN
INTRAVENOUS | Status: DC | PRN
  Administered 2018-01-11: 40 mg via INTRAVENOUS
  Administered 2018-01-11: 20 mg via INTRAVENOUS

## 2018-01-11 MED ORDER — ACETAMINOPHEN 325 MG PO TABS
650.0000 mg | ORAL_TABLET | ORAL | Status: DC | PRN
  Administered 2018-01-11 – 2018-01-14 (×3): 650 mg via ORAL
  Filled 2018-01-11 (×3): qty 2

## 2018-01-11 MED ORDER — HEMOSTATIC AGENTS (NO CHARGE) OPTIME
TOPICAL | Status: DC | PRN
  Administered 2018-01-11: 1 via TOPICAL

## 2018-01-11 MED ORDER — PROPOFOL 10 MG/ML IV BOLUS
INTRAVENOUS | Status: AC
  Filled 2018-01-11: qty 20

## 2018-01-11 MED ORDER — MIDAZOLAM HCL 2 MG/2ML IJ SOLN
INTRAMUSCULAR | Status: AC
  Filled 2018-01-11: qty 2

## 2018-01-11 MED ORDER — LORATADINE 10 MG PO TABS
10.0000 mg | ORAL_TABLET | Freq: Every day | ORAL | Status: DC
  Administered 2018-01-13 – 2018-01-14 (×3): 10 mg via ORAL
  Filled 2018-01-11 (×3): qty 1

## 2018-01-11 MED ORDER — VANCOMYCIN HCL 1000 MG IV SOLR
INTRAVENOUS | Status: AC
  Filled 2018-01-11: qty 1000

## 2018-01-11 MED ORDER — ONDANSETRON HCL 4 MG/2ML IJ SOLN
INTRAMUSCULAR | Status: AC
  Filled 2018-01-11: qty 2

## 2018-01-11 MED ORDER — BACITRACIN 50000 UNITS IM SOLR
INTRAMUSCULAR | Status: DC | PRN
  Administered 2018-01-11: 500 mL

## 2018-01-11 MED ORDER — LIDOCAINE HCL (CARDIAC) 20 MG/ML IV SOLN
INTRAVENOUS | Status: AC
  Filled 2018-01-11: qty 5

## 2018-01-11 MED ORDER — 0.9 % SODIUM CHLORIDE (POUR BTL) OPTIME
TOPICAL | Status: DC | PRN
  Administered 2018-01-11: 1000 mL

## 2018-01-11 MED ORDER — HYDROCODONE-ACETAMINOPHEN 10-325 MG PO TABS
2.0000 | ORAL_TABLET | ORAL | Status: DC | PRN
  Administered 2018-01-11 – 2018-01-12 (×4): 2 via ORAL
  Filled 2018-01-11 (×4): qty 2

## 2018-01-11 MED ORDER — SODIUM CHLORIDE 0.9 % IV SOLN: 250.0000 mL | INTRAVENOUS | Status: DC

## 2018-01-11 MED ORDER — MEPERIDINE HCL 50 MG/ML IJ SOLN: 6.2500 mg | INTRAMUSCULAR | Status: DC | PRN

## 2018-01-11 MED ORDER — PHENOL 1.4 % MT LIQD: 1.0000 | OROMUCOSAL | Status: DC | PRN

## 2018-01-11 MED ORDER — PROMETHAZINE HCL 25 MG/ML IJ SOLN: 6.2500 mg | INTRAMUSCULAR | Status: DC | PRN

## 2018-01-11 MED ORDER — THROMBIN (RECOMBINANT) 5000 UNITS EX SOLR
CUTANEOUS | Status: DC | PRN
Start: 2018-01-11 — End: 2018-01-11
  Administered 2018-01-11 (×2): 5000 [IU] via TOPICAL

## 2018-01-11 MED ORDER — LACTATED RINGERS IV SOLN
INTRAVENOUS | Status: DC | PRN
  Administered 2018-01-11: 08:00:00 via INTRAVENOUS

## 2018-01-11 MED ORDER — HYDRALAZINE HCL 20 MG/ML IJ SOLN
5.0000 mg | Freq: Once | INTRAMUSCULAR | Status: AC
  Administered 2018-01-11: 5 mg via INTRAVENOUS

## 2018-01-11 MED ORDER — ALPRAZOLAM 0.25 MG PO TABS
0.2500 mg | ORAL_TABLET | Freq: Three times a day (TID) | ORAL | Status: DC | PRN
  Administered 2018-01-13: 0.25 mg via ORAL
  Administered 2018-01-14: 0.5 mg via ORAL
  Filled 2018-01-11: qty 2
  Filled 2018-01-11: qty 1

## 2018-01-11 MED ORDER — BACITRACIN ZINC 500 UNIT/GM EX OINT
TOPICAL_OINTMENT | CUTANEOUS | Status: AC
  Filled 2018-01-11: qty 28.35

## 2018-01-11 MED ORDER — MENTHOL 3 MG MT LOZG
1.0000 | LOZENGE | OROMUCOSAL | Status: DC | PRN
  Filled 2018-01-11: qty 9

## 2018-01-11 MED ORDER — GUAIFENESIN ER 600 MG PO TB12
1200.0000 mg | ORAL_TABLET | Freq: Two times a day (BID) | ORAL | Status: DC | PRN
  Filled 2018-01-11: qty 2

## 2018-01-11 MED ORDER — THROMBIN 5000 UNITS EX SOLR
CUTANEOUS | Status: AC
  Filled 2018-01-11: qty 15000

## 2018-01-11 MED ORDER — CEFAZOLIN SODIUM-DEXTROSE 2-4 GM/100ML-% IV SOLN
INTRAVENOUS | Status: AC
  Filled 2018-01-11: qty 100

## 2018-01-11 MED ORDER — MIDAZOLAM HCL 5 MG/5ML IJ SOLN
INTRAMUSCULAR | Status: DC | PRN
  Administered 2018-01-11 (×2): 1 mg via INTRAVENOUS

## 2018-01-11 MED ORDER — SUGAMMADEX SODIUM 200 MG/2ML IV SOLN
INTRAVENOUS | Status: DC | PRN
  Administered 2018-01-11: 150 mg via INTRAVENOUS

## 2018-01-11 MED ORDER — KETOROLAC TROMETHAMINE 30 MG/ML IJ SOLN: 30.0000 mg | Freq: Once | INTRAMUSCULAR | Status: DC | PRN

## 2018-01-11 MED ORDER — BUPIVACAINE HCL (PF) 0.25 % IJ SOLN
INTRAMUSCULAR | Status: AC
  Filled 2018-01-11: qty 30

## 2018-01-11 MED ORDER — ACETAMINOPHEN 650 MG RE SUPP: 650.0000 mg | RECTAL | Status: DC | PRN

## 2018-01-11 MED ORDER — CEFAZOLIN SODIUM-DEXTROSE 1-4 GM/50ML-% IV SOLN
1.0000 g | Freq: Three times a day (TID) | INTRAVENOUS | Status: AC
  Administered 2018-01-11 – 2018-01-12 (×2): 1 g via INTRAVENOUS
  Filled 2018-01-11 (×2): qty 50

## 2018-01-11 MED ORDER — HYDROCODONE-ACETAMINOPHEN 5-325 MG PO TABS: 1.0000 | ORAL_TABLET | ORAL | Status: DC | PRN

## 2018-01-11 MED ORDER — CEFAZOLIN SODIUM-DEXTROSE 2-4 GM/100ML-% IV SOLN
2.0000 g | INTRAVENOUS | Status: AC
  Administered 2018-01-11: 2 g via INTRAVENOUS

## 2018-01-11 MED ORDER — LATANOPROST 0.005 % OP SOLN
1.0000 [drp] | Freq: Every day | OPHTHALMIC | Status: DC
  Administered 2018-01-11 – 2018-01-13 (×3): 1 [drp] via OPHTHALMIC
  Filled 2018-01-11: qty 2.5

## 2018-01-11 MED ORDER — ADULT MULTIVITAMIN W/MINERALS CH: 1.0000 | ORAL_TABLET | Freq: Every day | ORAL | Status: DC

## 2018-01-11 MED ORDER — HYDROMORPHONE HCL 1 MG/ML IJ SOLN
1.0000 mg | INTRAMUSCULAR | Status: DC | PRN
  Administered 2018-01-13: 1 mg via INTRAVENOUS
  Filled 2018-01-11: qty 1

## 2018-01-11 MED ORDER — ONDANSETRON HCL 4 MG PO TABS
4.0000 mg | ORAL_TABLET | Freq: Four times a day (QID) | ORAL | Status: DC | PRN
  Administered 2018-01-13: 4 mg via ORAL
  Filled 2018-01-11: qty 1

## 2018-01-11 MED ORDER — VITAMIN D 1000 UNITS PO TABS
2000.0000 [IU] | ORAL_TABLET | Freq: Every day | ORAL | Status: DC
  Filled 2018-01-11 (×2): qty 2

## 2018-01-11 MED ORDER — FAMOTIDINE 20 MG PO TABS
20.0000 mg | ORAL_TABLET | Freq: Every day | ORAL | Status: DC
  Administered 2018-01-11 – 2018-01-14 (×4): 20 mg via ORAL
  Filled 2018-01-11 (×4): qty 1

## 2018-01-11 MED ORDER — DEXAMETHASONE SODIUM PHOSPHATE 10 MG/ML IJ SOLN
INTRAMUSCULAR | Status: AC
  Filled 2018-01-11: qty 1

## 2018-01-11 MED ORDER — PHENYLEPHRINE HCL 10 MG/ML IJ SOLN
INTRAMUSCULAR | Status: DC | PRN
  Administered 2018-01-11: 40 ug via INTRAVENOUS

## 2018-01-11 MED ORDER — LISINOPRIL 10 MG PO TABS
10.0000 mg | ORAL_TABLET | Freq: Every day | ORAL | Status: DC
  Administered 2018-01-11 – 2018-01-13 (×2): 10 mg via ORAL
  Filled 2018-01-11 (×2): qty 1

## 2018-01-11 MED ORDER — FENTANYL CITRATE (PF) 100 MCG/2ML IJ SOLN
INTRAMUSCULAR | Status: DC | PRN
  Administered 2018-01-11 (×2): 150 ug via INTRAVENOUS
  Administered 2018-01-11 (×2): 100 ug via INTRAVENOUS

## 2018-01-11 MED ORDER — PROPOFOL 10 MG/ML IV BOLUS
INTRAVENOUS | Status: DC | PRN
  Administered 2018-01-11: 150 mg via INTRAVENOUS
  Administered 2018-01-11: 50 mg via INTRAVENOUS

## 2018-01-11 MED ORDER — THROMBIN 5000 UNITS EX SOLR
CUTANEOUS | Status: AC
  Filled 2018-01-11: qty 5000

## 2018-01-11 MED ORDER — SODIUM CHLORIDE 0.9% FLUSH
3.0000 mL | INTRAVENOUS | Status: DC | PRN
Start: 2018-01-11 — End: 2018-01-11

## 2018-01-11 MED ORDER — LACTATED RINGERS IV SOLN
INTRAVENOUS | Status: DC
  Administered 2018-01-11 (×2): via INTRAVENOUS

## 2018-01-11 MED ORDER — GABAPENTIN 300 MG PO CAPS: 300.0000 mg | ORAL_CAPSULE | Freq: Every evening | ORAL | Status: DC | PRN

## 2018-01-11 MED ORDER — DEXTROSE 5 % IV SOLN
INTRAVENOUS | Status: DC | PRN
  Administered 2018-01-11: 20 ug/min via INTRAVENOUS

## 2018-01-11 MED ORDER — ROCURONIUM BROMIDE 10 MG/ML (PF) SYRINGE
PREFILLED_SYRINGE | INTRAVENOUS | Status: AC
  Filled 2018-01-11: qty 5

## 2018-01-11 MED ORDER — VANCOMYCIN HCL 1000 MG IV SOLR
INTRAVENOUS | Status: DC | PRN
  Administered 2018-01-11: 1000 mg via TOPICAL

## 2018-01-11 MED ORDER — LISINOPRIL-HYDROCHLOROTHIAZIDE 10-12.5 MG PO TABS: 1.0000 | ORAL_TABLET | Freq: Every day | ORAL | Status: DC

## 2018-01-11 MED ORDER — HYDROCHLOROTHIAZIDE 12.5 MG PO CAPS
12.5000 mg | ORAL_CAPSULE | Freq: Every day | ORAL | Status: DC
  Administered 2018-01-11 – 2018-01-13 (×2): 12.5 mg via ORAL
  Filled 2018-01-11 (×2): qty 1

## 2018-01-11 MED ORDER — FLUTICASONE PROPIONATE 50 MCG/ACT NA SUSP
2.0000 | Freq: Every day | NASAL | Status: DC | PRN
  Filled 2018-01-11: qty 16

## 2018-01-11 MED ORDER — SODIUM CHLORIDE 0.9% FLUSH: 3.0000 mL | Freq: Two times a day (BID) | INTRAVENOUS | Status: DC

## 2018-01-11 MED ORDER — ONDANSETRON HCL 4 MG/2ML IJ SOLN
4.0000 mg | Freq: Four times a day (QID) | INTRAMUSCULAR | Status: DC | PRN
  Administered 2018-01-13: 4 mg via INTRAVENOUS
  Filled 2018-01-11: qty 2

## 2018-01-11 MED ORDER — ALIVE WOMENS 50+ PO TABS: 1.0000 | ORAL_TABLET | Freq: Every day | ORAL | Status: DC

## 2018-01-11 MED ORDER — DEXAMETHASONE SODIUM PHOSPHATE 10 MG/ML IJ SOLN
10.0000 mg | INTRAMUSCULAR | Status: AC
  Administered 2018-01-11: 10 mg via INTRAVENOUS

## 2018-01-11 MED ORDER — HYDRALAZINE HCL 20 MG/ML IJ SOLN
INTRAMUSCULAR | Status: AC
  Filled 2018-01-11: qty 1

## 2018-01-11 MED ORDER — THROMBIN (RECOMBINANT) 5000 UNITS EX SOLR
OROMUCOSAL | Status: DC | PRN
  Administered 2018-01-11 (×2): 5 mL via TOPICAL

## 2018-01-11 MED ORDER — HYDROMORPHONE HCL 1 MG/ML IJ SOLN
0.2500 mg | INTRAMUSCULAR | Status: DC | PRN
  Administered 2018-01-11 (×4): 0.5 mg via INTRAVENOUS

## 2018-01-11 MED ORDER — LIDOCAINE HCL (CARDIAC) 20 MG/ML IV SOLN
INTRAVENOUS | Status: DC | PRN
  Administered 2018-01-11: 100 mg via INTRAVENOUS

## 2018-01-11 MED ORDER — SUCCINYLCHOLINE CHLORIDE 20 MG/ML IJ SOLN
INTRAMUSCULAR | Status: AC
  Filled 2018-01-11: qty 1

## 2018-01-11 MED ORDER — BACITRACIN ZINC 500 UNIT/GM EX OINT
TOPICAL_OINTMENT | CUTANEOUS | Status: DC | PRN
  Administered 2018-01-11: 1 via TOPICAL

## 2018-01-11 MED ORDER — CYCLOBENZAPRINE HCL 10 MG PO TABS
10.0000 mg | ORAL_TABLET | Freq: Three times a day (TID) | ORAL | Status: DC | PRN
  Administered 2018-01-11 – 2018-01-13 (×4): 10 mg via ORAL
  Filled 2018-01-11 (×4): qty 1

## 2018-01-11 MED ORDER — SUGAMMADEX SODIUM 200 MG/2ML IV SOLN
INTRAVENOUS | Status: AC
  Filled 2018-01-11: qty 2

## 2018-01-11 SURGICAL SUPPLY — 76 items
ADH SKN CLS APL DERMABOND .7 (GAUZE/BANDAGES/DRESSINGS) ×2
APL SKNCLS STERI-STRIP NONHPOA (GAUZE/BANDAGES/DRESSINGS) ×2
Atlas Single Cable with Integral Crimp ×1 IMPLANT
BAG DECANTER FOR FLEXI CONT (MISCELLANEOUS) ×3 IMPLANT
BENZOIN TINCTURE PRP APPL 2/3 (GAUZE/BANDAGES/DRESSINGS) ×6 IMPLANT
BIT DRILL 2.4X (BIT) IMPLANT
BIT DRL 2.4X (BIT) ×1
BLADE CLIPPER SURG (BLADE) ×2 IMPLANT
BUR MATCHSTICK NEURO 3.0 LAGG (BURR) ×3 IMPLANT
CABLE SNG STERILE W/CRIMP (MISCELLANEOUS) ×2 IMPLANT
CANISTER SUCT 3000ML PPV (MISCELLANEOUS) ×3 IMPLANT
CARTRIDGE OIL MAESTRO DRILL (MISCELLANEOUS) ×1 IMPLANT
CLOSURE WOUND 1/2 X4 (GAUZE/BANDAGES/DRESSINGS) ×2
CONT SPEC 4OZ CLIKSEAL STRL BL (MISCELLANEOUS) ×2 IMPLANT
DERMABOND ADVANCED (GAUZE/BANDAGES/DRESSINGS) ×4
DERMABOND ADVANCED .7 DNX12 (GAUZE/BANDAGES/DRESSINGS) ×1 IMPLANT
DIFFUSER DRILL AIR PNEUMATIC (MISCELLANEOUS) ×3 IMPLANT
DRAPE C-ARM 42X72 X-RAY (DRAPES) ×6 IMPLANT
DRAPE INCISE IOBAN 66X45 STRL (DRAPES) ×2 IMPLANT
DRAPE LAPAROTOMY 100X72 PEDS (DRAPES) ×3 IMPLANT
DRAPE POUCH INSTRU U-SHP 10X18 (DRAPES) ×2 IMPLANT
DRILL BIT (BIT) ×3
DRSG OPSITE POSTOP 3X4 (GAUZE/BANDAGES/DRESSINGS) ×2 IMPLANT
DRSG OPSITE POSTOP 4X6 (GAUZE/BANDAGES/DRESSINGS) ×2 IMPLANT
DURAPREP 26ML APPLICATOR (WOUND CARE) ×5 IMPLANT
ELECT REM PT RETURN 9FT ADLT (ELECTROSURGICAL) ×3
ELECTRODE REM PT RTRN 9FT ADLT (ELECTROSURGICAL) ×1 IMPLANT
EVACUATOR 1/8 PVC DRAIN (DRAIN) IMPLANT
GAUZE SPONGE 4X4 12PLY STRL (GAUZE/BANDAGES/DRESSINGS) ×3 IMPLANT
GAUZE SPONGE 4X4 16PLY XRAY LF (GAUZE/BANDAGES/DRESSINGS) IMPLANT
GLOVE BIO SURGEON STRL SZ 6.5 (GLOVE) ×5 IMPLANT
GLOVE BIO SURGEONS STRL SZ 6.5 (GLOVE) ×5
GLOVE BIOGEL PI IND STRL 6.5 (GLOVE) IMPLANT
GLOVE BIOGEL PI IND STRL 7.5 (GLOVE) IMPLANT
GLOVE BIOGEL PI INDICATOR 6.5 (GLOVE) ×8
GLOVE BIOGEL PI INDICATOR 7.5 (GLOVE) ×4
GLOVE ECLIPSE 7.0 STRL STRAW (GLOVE) ×2 IMPLANT
GLOVE ECLIPSE 9.0 STRL (GLOVE) ×9 IMPLANT
GLOVE EXAM NITRILE LRG STRL (GLOVE) IMPLANT
GLOVE EXAM NITRILE XL STR (GLOVE) IMPLANT
GLOVE EXAM NITRILE XS STR PU (GLOVE) IMPLANT
GLOVE SURG SS PI 6.5 STRL IVOR (GLOVE) IMPLANT
GOWN STRL REUS W/ TWL LRG LVL3 (GOWN DISPOSABLE) IMPLANT
GOWN STRL REUS W/ TWL XL LVL3 (GOWN DISPOSABLE) ×1 IMPLANT
GOWN STRL REUS W/TWL 2XL LVL3 (GOWN DISPOSABLE) IMPLANT
GOWN STRL REUS W/TWL LRG LVL3 (GOWN DISPOSABLE) ×9
GOWN STRL REUS W/TWL XL LVL3 (GOWN DISPOSABLE) ×6
HEMOSTAT POWDER KIT SURGIFOAM (HEMOSTASIS) ×4 IMPLANT
KIT BASIN OR (CUSTOM PROCEDURE TRAY) ×3 IMPLANT
KIT ROOM TURNOVER OR (KITS) ×3 IMPLANT
NDL SPNL 22GX3.5 QUINCKE BK (NEEDLE) ×1 IMPLANT
NEEDLE HYPO 22GX1.5 SAFETY (NEEDLE) ×3 IMPLANT
NEEDLE SPNL 22GX3.5 QUINCKE BK (NEEDLE) ×3 IMPLANT
NS IRRIG 1000ML POUR BTL (IV SOLUTION) ×3 IMPLANT
OIL CARTRIDGE MAESTRO DRILL (MISCELLANEOUS) ×3
PACK LAMINECTOMY NEURO (CUSTOM PROCEDURE TRAY) ×3 IMPLANT
PAD ARMBOARD 7.5X6 YLW CONV (MISCELLANEOUS) ×9 IMPLANT
PIN MAYFIELD SKULL DISP (PIN) ×3 IMPLANT
ROD VERTEX SPINAL (Rod) ×6 IMPLANT
ROD VERTEX SPN 30X3.5XPCUT NS (Rod) IMPLANT
SCREW 3.5X16MM (Screw) ×6 IMPLANT
SCREW BN 16X3.5XMA NS SPNE (Screw) IMPLANT
SCREW SET M6 (Screw) ×8 IMPLANT
SCREW VERTEX PT 3.5X30 (Screw) ×4 IMPLANT
SPONGE LAP 4X18 X RAY DECT (DISPOSABLE) IMPLANT
SPONGE SURGIFOAM ABS GEL SZ50 (HEMOSTASIS) ×3 IMPLANT
STAPLER SKIN PROX WIDE 3.9 (STAPLE) ×2 IMPLANT
STRIP CLOSURE SKIN 1/2X4 (GAUZE/BANDAGES/DRESSINGS) ×3 IMPLANT
SUT VIC AB 0 CT1 18XCR BRD8 (SUTURE) ×1 IMPLANT
SUT VIC AB 0 CT1 8-18 (SUTURE) ×3
SUT VIC AB 2-0 CT1 18 (SUTURE) ×3 IMPLANT
SUT VIC AB 3-0 SH 8-18 (SUTURE) ×5 IMPLANT
TAPE CLOTH 4X10 WHT NS (GAUZE/BANDAGES/DRESSINGS) ×1 IMPLANT
TOWEL GREEN STERILE (TOWEL DISPOSABLE) ×3 IMPLANT
TOWEL GREEN STERILE FF (TOWEL DISPOSABLE) ×3 IMPLANT
WATER STERILE IRR 1000ML POUR (IV SOLUTION) ×3 IMPLANT

## 2018-01-11 NOTE — H&P (Signed)
Susan Randolph is an 72 y.o. female.   Chief Complaint: Neck pain HPI: 73 year old female with chronic and progressive neck pain with associated suboccipital neuralgia failing conservative management.  Workup demonstrates evidence of marked facet arthropathy at C1-C2.  Patient is failed injections therapy and medical management.  She presents now for C1-C2 posterior cervical fusion with instrumentation and iliac crest autografting.  Past Medical History:  Diagnosis Date  . Anxiety   . Arthritis   . Cancer ([place])    basal cell carcinoma  . GERD (gastroesophageal reflux disease)   . Headache   . Hypertension   . Rectal bleed 09/2016    Past Surgical History:  Procedure Laterality Date  . EYE SURGERY     Corrected drooping eye lids bilateral  . TUBAL LIGATION      Family History  Problem Relation Age of Onset  . Cancer Mother        breast cancer/endometrial cancer  . Hypertension Mother   . Breast cancer Mother   . Hypertension Father   . Cancer Father        Glioblastoma  . Arthritis Father   . Rheum arthritis Maternal Uncle    Social History:  reports that she has never smoked. She has never used smokeless tobacco. She reports that she drinks alcohol. She reports that she does not use drugs.  Allergies:  Allergies  Allergen Reactions  . Sulfa Antibiotics Other (See Comments)    Doesn't remember, happened in childhood  . Amitriptyline Other (See Comments)    tremors    Medications Prior to Admission  Medication Sig Dispense Refill  . acetaminophen (TYLENOL) 500 MG tablet Take 500 mg by mouth 2 (two) times daily as needed for mild pain.     Marland Kitchen ALPRAZolam (XANAX) 0.5 MG tablet Take 0.25-0.5 mg by mouth 3 (three) times daily as needed for anxiety.    . bimatoprost (LUMIGAN) 0.01 % SOLN Place 1 drop into both eyes at bedtime.     . cetirizine (ZYRTEC) 10 MG tablet Take 10 mg by mouth daily as needed for allergies.    . famotidine (PEPCID) 20 MG tablet Take 20 mg by  mouth daily.  1  . fluticasone (FLONASE) 50 MCG/ACT nasal spray Place 2 sprays into both nostrils daily as needed for allergies or rhinitis.    Marland Kitchen gabapentin (NEURONTIN) 300 MG capsule Take 1 capsule by mouth at bedtime as needed for pain.  3  . Guaifenesin (MUCINEX MAXIMUM STRENGTH) 1200 MG TB12 Take 2 tablets by mouth 2 (two) times daily as needed (cold symptoms).    Marland Kitchen lisinopril-hydrochlorothiazide (PRINZIDE,ZESTORETIC) 10-12.5 MG tablet Take 1 tablet by mouth daily.  9  . Multiple Vitamins-Minerals (ALIVE WOMENS 50+) TABS Take 1 tablet by mouth daily.    . Cholecalciferol (VITAMIN D3) 2000 units capsule Take 2,000 Units by mouth daily.       No results found for this or any previous visit (from the past 48 hour(s)). No results found.  Pertinent items noted in HPI and remainder of comprehensive ROS otherwise negative.  Blood pressure (!) 155/83, pulse 68, temperature 98.4 F (36.9 C), temperature source Oral, resp. rate 20, height 5\' 5"  (1.651 m), weight 70.3 kg (155 lb), SpO2 96 %.  Patient is awake and alert.  She is oriented and appropriate.  Cranial nerve function is intact.  Speech is fluent.  Judgment insight are intact.  Motor examination extremities normal.  Sensory examination nonfocal.  Deep tender mixes normal active.  No  visible long track signs.  Gait and posture normal.  Examination head ears eyes and throat is unremarkable her chest and abdomen are benign.  Extremities are free from injury deformity.  Upper cervical spine with some mild tenderness and decreased range of motion. Assessment/Plan C1-C2 arthropathy with intractable neck pain and occipital neuralgia.  Plan C1-C2 posterior cervical fusion with interspinous wiring and iliac crest autografting with lateral mass screw instrumentation.  Risks and benefits of been explained.  Patient wishes to proceed.  Mallie Mussel A Kadence Mikkelson 01/11/2018, 7:51 AM

## 2018-01-11 NOTE — Progress Notes (Signed)
Orthopedic Tech Progress Note Patient Details:  Susan Randolph 04/01/1946 [id number]  Ortho Devices Type of Ortho Device: Soft collar Ortho Device/Splint Location: neck Ortho Device/Splint Interventions: Application   Post Interventions Patient Tolerated: Well Instructions Provided: Care of device   [name] 01/11/2018, 1:18 PM

## 2018-01-11 NOTE — Brief Op Note (Signed)
01/11/2018  10:37 AM  PATIENT:  Susan Randolph  72 y.o. female  PRE-OPERATIVE DIAGNOSIS:  stenosis  POST-OPERATIVE DIAGNOSIS:  stenosis  PROCEDURE:  Procedure(s): Posterior Cervical Fusion with lateral mass fixation - Cervical one-Cervical two with Iliac Crest bone graft (N/A)  SURGEON:  Surgeon(s) and Role:    * Earnie Larsson, MD - Primary    * Consuella Lose, MD - Assisting  PHYSICIAN ASSISTANT:   ASSISTANTS:    ANESTHESIA:   general  EBL:  500 mL   BLOOD ADMINISTERED:none  DRAINS: none   LOCAL MEDICATIONS USED:  NONE  SPECIMEN:  No Specimen  DISPOSITION OF SPECIMEN:  N/A  COUNTS:  YES  TOURNIQUET:  * No tourniquets in log *  DICTATION: .Dragon Dictation  PLAN OF CARE: Admit to inpatient   PATIENT DISPOSITION:  PACU - hemodynamically stable.   Delay start of Pharmacological VTE agent (>24hrs) due to surgical blood loss or risk of bleeding: yes

## 2018-01-11 NOTE — Op Note (Signed)
Date of procedure: 01/11/2018  Date of dictation: Same  Service: Neurosurgery  Preoperative diagnosis: Left C1-C2 arthropathy with intractable neck pain and occipital neuralgia  Postoperative diagnosis: Same  Procedure Name: C1-C2 posterior cervical fusion with iliac crest autograft utilizing interspinous wiring and lateral mass instrumentation  Right iliac crest bone harvest  Surgeon:Rhodes Calvert A.Imagene Boss, M.D.  Asst. Surgeon: Kathyrn Sheriff  Anesthesia: General  Indication: 72 year old female with intractable neck pain and associated occipital neuralgia.  Workup demonstrates evidence of severe arthropathy of her C1-C2 articulation on the left side with associated spondylosis.  Patient presents now for C1-C2 fusion in hopes of improving her symptoms.  Operative note: After induction of anesthesia, patient position prone onto bolsters with her head fixed in the [place] pin headrest in her chin slightly tucked.  Patient's posterior cervical region and right iliac region were prepped and draped sterilely.  Incision was made overlying the iliac crest on the right side.  This carried down sharply through the fascia.  Iliac crest just lateral to the super posterior superior iliac spine was dissected free.  This was then cut using osteotomes and a tricortical piece of bone was removed.  Bleeding edges of bone were waxed.  Wound was then closed in layers of Vicryl sutures.  Attention then placed to the cervical region.  Incision made overlying her suboccipital region down to C3.  Dissection performed bilaterally.  Retractor placed.  Lamina of C1 dissected laterally and the articular mass of C2 was completely dissected free.  Interspinous membrane was then elevated exposing the underlying dura.  A single Atlas cable was then passed in a looped fashion underneath the lamina of C1.  Lateral mass entry site of C1 was then dissected free.  The C2 nerve roots were skeletonized and reflected inferiorly.  Pilot holes were  drilled using high-speed drill.  A hole was then drilled using a 30 mm drill bit under fluoroscopic guidance extending to the anterior cortical margin of C1.  This was probed and found to be solidly within the bone.  30 mm partially-threaded screws were placed bilaterally at C1.  Lateral mass/pars interarticularis screws of C2 were placed bilaterally.  Pilot holes were drilled in the mid position of the articular mass of C2.  This is then drilled to a depth of 16 mm under fluoroscopic guidance.  Once again this was probed and found to be solidly within bone.  16 mm vertex screws were placed bilaterally with good purchase.  The looped Atlas cable was then used to surround the trachea cortical iliac autograft after to been fashioned to fit between the inferior aspect of the lamina of C1 and the spinous process of C2.  The inferior aspect of C1 and the spinous process of C2 and the superior lamina of C2 were decorticated.  The wire was then placed around the inferior aspect of the spinous process of C2 and then gradually tightened in a technique first described by Dr. Javier Docker.  The cable was completely secured and then crimped in place.  Excess cable was removed.  Short segment titanium rods and placed over the screw heads of C1 and C2.  Locking caps placed of the screw through the locking caps and engaged in given a final tightening.  Final images reveal good position of hardware in the lateral plane.'s AP plane imaging was limited secondary to artifact from the head holder.  Wound was irrigated one final time.  Vancomycin powder was placed in deep wound space.  Wounds and closed in layers with  Vicryl sutures.  Steri-Strips and sterile dressing were applied.  No apparent complications.  Patient tolerated the procedure well and she returns to the recovery room postop.

## 2018-01-11 NOTE — Transfer of Care (Signed)
Immediate Anesthesia Transfer of Care Note  Patient: Susan Randolph  Procedure(s) Performed: Posterior Cervical Fusion with lateral mass fixation - Cervical one-Cervical two with Iliac Crest bone graft (N/A Spine Cervical)  Patient Location: PACU  Anesthesia Type:General  Level of Consciousness: awake, sedated and patient cooperative  Airway & Oxygen Therapy: Patient Spontanous Breathing and Patient connected to nasal cannula oxygen  Post-op Assessment: Report given to RN, Post -op Vital signs reviewed and stable and Patient moving all extremities  Post vital signs: Reviewed and stable  Last Vitals:  Vitals Value Taken Time  BP    Temp    Pulse    Resp    SpO2      Last Pain:  Vitals:   01/11/18 0700  TempSrc:   PainSc: 4       Patients Stated Pain Goal: 3 (32/35/57 3220)  Complications: No apparent anesthesia complications

## 2018-01-11 NOTE — Anesthesia Postprocedure Evaluation (Signed)
Anesthesia Post Note  Patient: Susan Randolph  Procedure(s) Performed: Posterior Cervical Fusion with lateral mass fixation - Cervical one-Cervical two with Iliac Crest bone graft (N/A Spine Cervical)     Patient location during evaluation: PACU Anesthesia Type: General Level of consciousness: awake Pain management: pain level controlled Vital Signs Assessment: post-procedure vital signs reviewed and stable Respiratory status: spontaneous breathing Cardiovascular status: stable Postop Assessment: no apparent nausea or vomiting Anesthetic complications: no    Last Vitals:  Vitals:   01/11/18 1201 01/11/18 1210  BP: (!) 184/85   Pulse: 91   Resp: 14   Temp:  36.5 C  SpO2: 100%     Last Pain:  Vitals:   01/11/18 1210  TempSrc:   PainSc: 4    Pain Goal: Patients Stated Pain Goal: 1 (01/11/18 1059)               [place]

## 2018-01-11 NOTE — Anesthesia Procedure Notes (Signed)
Procedure Name: Intubation Date/Time: 01/11/2018 8:08 AM Performed by: Izora Gala, CRNA Pre-anesthesia Checklist: Patient identified, Emergency Drugs available, Suction available and Patient being monitored Patient Re-evaluated:Patient Re-evaluated prior to induction Oxygen Delivery Method: Circle system utilized Preoxygenation: Pre-oxygenation with 100% oxygen Induction Type: IV induction Ventilation: Mask ventilation without difficulty Laryngoscope Size: Miller and 3 Grade View: Grade II Tube type: Oral Tube size: 7.0 mm Number of attempts: 1 Airway Equipment and Method: Stylet Placement Confirmation: ETT inserted through vocal cords under direct vision,  positive ETCO2 and breath sounds checked- equal and bilateral Secured at: 22 cm Tube secured with: Tape Dental Injury: Teeth and Oropharynx as per pre-operative assessment

## 2018-01-12 ENCOUNTER — Encounter (HOSPITAL_COMMUNITY): Payer: Self-pay | Admitting: Neurosurgery

## 2018-01-12 MED ORDER — IBUPROFEN 200 MG PO TABS
400.0000 mg | ORAL_TABLET | ORAL | Status: DC | PRN
  Administered 2018-01-12 – 2018-01-14 (×3): 400 mg via ORAL
  Filled 2018-01-12 (×3): qty 2

## 2018-01-12 MED ORDER — OXYCODONE-ACETAMINOPHEN 5-325 MG PO TABS
1.0000 | ORAL_TABLET | ORAL | Status: DC | PRN
  Administered 2018-01-12 – 2018-01-13 (×5): 2 via ORAL
  Filled 2018-01-12 (×5): qty 2

## 2018-01-12 MED FILL — Thrombin For Soln 5000 Unit: CUTANEOUS | Qty: 5000 | Status: AC

## 2018-01-12 MED FILL — Thrombin For Soln 5000 Unit: CUTANEOUS | Qty: 2 | Status: AC

## 2018-01-12 NOTE — Progress Notes (Signed)
Postop day 1.  Patient with appropriate incisional pain and some associated headaches.  No new neurologic symptoms.  Able to ambulate with therapy today.  Vital signs stable.  Patient did have some moderate orthostatic hypotension with ambulation earlier today.  Motor and sensory function extremities normal.  Wound clean and dry.  Chest and abdomen benign.  Progressing well following upper cervical fusion.  Continue efforts at mobilization today.  Probable discharge tomorrow.

## 2018-01-12 NOTE — Evaluation (Signed)
Physical Therapy Evaluation Patient Details Name: Susan Randolph MRN: [id number] DOB: 08/26/1946 Today's Date: 01/12/2018   History of Present Illness  Pt is a 72 y/o female who presents s/p C1-C2 posterior cervical fusion on 01/11/18. PMH significant for HTN, CA, anxiety.  Clinical Impression  Pt admitted with above diagnosis. Pt currently with functional limitations due to the deficits listed below (see PT Problem List). At the time of PT eval pt was limited by orthostatic hypotension. Pt reports immediate dizziness and lightheadedness upon sitting EOB. After ~5 minutes, BP 118/63. In standing, BP dropped to 84/75 and pt with difficulty keeping eyes open. Pt was returned to supine and SCD's applied to improve BP. Pt and daughter were encouraged to start elevating HOB slowly throughout morning to accommodate to a more upright position. Anticipate pt will progress well with mobility once BP is controlled. Acutely, pt will benefit from skilled PT to increase their independence and safety with mobility to allow discharge to the venue listed below.       Follow Up Recommendations No PT follow up;Supervision for mobility/OOB    Equipment Recommendations  Rolling walker with 5" wheels    Recommendations for Other Services       Precautions / Restrictions Precautions Precautions: Fall;Cervical Precaution Booklet Issued: Yes (comment) Precaution Comments: Reviewed precaution sheet with pt and daughter.  Required Braces or Orthoses: Cervical Brace Cervical Brace: Soft collar Restrictions Weight Bearing Restrictions: No      Mobility  Bed Mobility Overal bed mobility: Needs Assistance Bed Mobility: Rolling;Sidelying to Sit;Sit to Sidelying Rolling: Supervision Sidelying to sit: Supervision     Sit to sidelying: Supervision General bed mobility comments: VC's for proper log roll technique. Pt reports immediate dizziness upon sitting EOB and required assist to maintain sitting balance.    Transfers Overall transfer level: Needs assistance Equipment used: Rolling walker (2 wheeled) Transfers: Sit to/from Stand Sit to Stand: Min assist         General transfer comment: Assist to power-up to full standing position. VC's for hand placement on seated surface for safety. Increased time required.   Ambulation/Gait             General Gait Details: Unable due to orthostatic hypotension  Stairs            Wheelchair Mobility    Modified Rankin (Stroke Patients Only)       Balance Overall balance assessment: Needs assistance Sitting-balance support: Feet supported;No upper extremity supported Sitting balance-Leahy Scale: Fair     Standing balance support: Bilateral upper extremity supported Standing balance-Leahy Scale: Poor Standing balance comment: Hands-on assist required to maintain standing balance                             Pertinent Vitals/Pain Pain Assessment: Faces Faces Pain Scale: Hurts even more Pain Location: Incision site Pain Descriptors / Indicators: Operative site guarding;Aching Pain Intervention(s): Limited activity within patient's tolerance;Monitored during session;Repositioned    Home Living Family/patient expects to be discharged to:: Private residence Living Arrangements: Spouse/significant other Available Help at Discharge: Family;Available 24 hours/day Type of Home: House Home Access: Stairs to enter   [employer] of Steps: 3-5 Home Layout: One level Home Equipment: None      Prior Function Level of Independence: Independent               Hand Dominance        Extremity/Trunk Assessment   Upper Extremity Assessment  Upper Extremity Assessment: Defer to OT evaluation    Lower Extremity Assessment Lower Extremity Assessment: Overall WFL for tasks assessed    Cervical / Trunk Assessment Cervical / Trunk Assessment: Normal  Communication   Communication: No difficulties   Cognition Arousal/Alertness: Awake/alert Behavior During Therapy: WFL for tasks assessed/performed Overall Cognitive Status: Within Functional Limits for tasks assessed                                        General Comments      Exercises     Assessment/Plan    PT Assessment Patient needs continued PT services  PT Problem List Decreased strength;Decreased range of motion;Decreased activity tolerance;Decreased balance;Decreased mobility;Decreased knowledge of use of DME;Decreased safety awareness;Decreased knowledge of precautions;Pain       PT Treatment Interventions DME instruction;Gait training;Stair training;Functional mobility training;Therapeutic activities;Therapeutic exercise;Neuromuscular re-education;Patient/family education    PT Goals (Current goals can be found in the Care Plan section)  Acute Rehab PT Goals Patient Stated Goal: Feel better PT Goal Formulation: With patient/family Time For Goal Achievement: 01/19/18 Potential to Achieve Goals: Good    Frequency Min 5X/week   Barriers to discharge        Co-evaluation               AM-PAC PT "6 Clicks" Daily Activity  Outcome Measure Difficulty turning over in bed (including adjusting bedclothes, sheets and blankets)?: A Little Difficulty moving from lying on back to sitting on the side of the bed? : A Little Difficulty sitting down on and standing up from a chair with arms (e.g., wheelchair, bedside commode, etc,.)?: A Little Help needed moving to and from a bed to chair (including a wheelchair)?: A Little Help needed walking in hospital room?: A Little Help needed climbing 3-5 steps with a railing? : A Lot 6 Click Score: 17    End of Session Equipment Utilized During Treatment: Gait belt;Cervical collar Activity Tolerance: Treatment limited secondary to medical complications (Comment)(orthostatic hypotension) Patient left: in bed;with call bell/phone within reach;with SCD's  reapplied Nurse Communication: Mobility status PT Visit Diagnosis: Unsteadiness on feet (R26.81);Pain;Other abnormalities of gait and mobility (R26.89) Pain - part of body: (Neck)    Time: 6979-4801 PT Time Calculation (min) (ACUTE ONLY): 20 min   Charges:   PT Evaluation $PT Eval Moderate Complexity: 1 Mod     PT G Codes:        Rolinda Roan, PT, DPT Acute Rehabilitation Services Pager: [phone]   Thelma Comp 01/12/2018, 9:57 AM

## 2018-01-12 NOTE — Progress Notes (Signed)
OT Cancellation Note  Patient Details Name: Susan Randolph MRN: [id number] DOB: 05-07-1946   Cancelled Treatment:    Reason Eval/Treat Not Completed: Patient not medically ready Pt finishing with PT and not ready for OT session at this time. OT to continue to follow and check back later today  Vonita Moss   OTR/L Pager: [phone] Office: [phone] .  01/12/2018, 8:50 AM

## 2018-01-13 MED ORDER — KETOROLAC TROMETHAMINE 30 MG/ML IJ SOLN
30.0000 mg | Freq: Four times a day (QID) | INTRAMUSCULAR | Status: DC | PRN
  Administered 2018-01-13 – 2018-01-14 (×4): 30 mg via INTRAVENOUS
  Filled 2018-01-13 (×4): qty 1

## 2018-01-13 NOTE — Progress Notes (Signed)
Physical Therapy Treatment Patient Details Name: Susan Randolph MRN: [id number] DOB: 05/14/1946 Today's Date: 01/13/2018    History of Present Illness Pt is a 72 y/o female who presents s/p C1-C2 posterior cervical fusion on 01/11/18. PMH significant for HTN, CA, anxiety.    PT Comments    Pt very flat and withdrawn during session today. At times, therapist asking the same question multiple times before pt would verbally answer. Did seem to perk up a little bit when daughters stepped out of the room, even though they seem very supportive. Pt limited by pain this session, and states most pain is in her R hip. We were not able to make it to the stairs to initiate stair training. If pt is discharging today, will make an effort to return to attempt stairs before she leaves. Will continue to follow and progress as able per POC.   Follow Up Recommendations  No PT follow up;Supervision for mobility/OOB     Equipment Recommendations  Rolling walker with 5" wheels    Recommendations for Other Services       Precautions / Restrictions Precautions Precautions: Fall;Cervical Precaution Booklet Issued: Yes (comment) Precaution Comments: Reviewed precaution sheet with pt and daughter.  Required Braces or Orthoses: Cervical Brace Cervical Brace: Soft collar Restrictions Weight Bearing Restrictions: No    Mobility  Bed Mobility               General bed mobility comments: Pt was received sitting up in recliner  Transfers Overall transfer level: Needs assistance Equipment used: Rolling walker (2 wheeled) Transfers: Sit to/from Stand Sit to Stand: Min guard         General transfer comment: Close guard for safety. VC's for hand placement on seated surface for safety.   Ambulation/Gait Ambulation/Gait assistance: Min guard Ambulation Distance (Feet): 100 Feet Assistive device: Rolling walker (2 wheeled) Gait Pattern/deviations: Step-through pattern;Decreased stride length;Narrow  base of support Gait velocity: Decreased Gait velocity interpretation: Below normal speed for age/gender General Gait Details: Slow and very guarded. Pt limited by pain and states she is hurting mainly in her R hip and chest (reflux). Overall balance is fairly good. Was able to stop and take hands off the walker to point to areas of pain.    Stairs            Wheelchair Mobility    Modified Rankin (Stroke Patients Only)       Balance Overall balance assessment: Needs assistance Sitting-balance support: Feet supported;No upper extremity supported Sitting balance-Leahy Scale: Fair     Standing balance support: Single extremity supported;During functional activity Standing balance-Leahy Scale: Fair Standing balance comment: statically                            Cognition Arousal/Alertness: Awake/alert Behavior During Therapy: Flat affect Overall Cognitive Status: Within Functional Limits for tasks assessed                                        Exercises      General Comments        Pertinent Vitals/Pain Pain Assessment: 0-10 Pain Score: 8  Pain Location: "Reflux" and R hip Pain Descriptors / Indicators: Discomfort Pain Intervention(s): Limited activity within patient's tolerance;Monitored during session;Repositioned    Home Living  Prior Function            PT Goals (current goals can now be found in the care plan section) Acute Rehab PT Goals PT Goal Formulation: With patient Time For Goal Achievement: 01/19/18 Potential to Achieve Goals: Good Progress towards PT goals: Progressing toward goals    Frequency    Min 5X/week      PT Plan Current plan remains appropriate    Co-evaluation              AM-PAC PT "6 Clicks" Daily Activity  Outcome Measure  Difficulty turning over in bed (including adjusting bedclothes, sheets and blankets)?: A Little Difficulty moving from lying on  back to sitting on the side of the bed? : A Little Difficulty sitting down on and standing up from a chair with arms (e.g., wheelchair, bedside commode, etc,.)?: A Little Help needed moving to and from a bed to chair (including a wheelchair)?: A Little Help needed walking in hospital room?: A Little Help needed climbing 3-5 steps with a railing? : A Lot 6 Click Score: 17    End of Session Equipment Utilized During Treatment: Gait belt;Cervical collar Activity Tolerance: Patient limited by fatigue Patient left: in chair;with call bell/phone within reach Nurse Communication: Mobility status PT Visit Diagnosis: Unsteadiness on feet (R26.81);Pain Pain - part of body: (neck)     Time: 6060-0459 PT Time Calculation (min) (ACUTE ONLY): 14 min  Charges:  $Gait Training: 8-22 mins                    G Codes:       Rolinda Roan, PT, DPT Acute Rehabilitation Services Pager: [phone]    Thelma Comp 01/13/2018, 12:27 PM

## 2018-01-13 NOTE — Progress Notes (Signed)
Physical Therapy Treatment Patient Details Name: Susan Randolph MRN: [id number] DOB: 07-Dec-1945 Today's Date: 01/13/2018    History of Present Illness Pt is a 72 y/o female who presents s/p C1-C2 posterior cervical fusion on 01/11/18. PMH significant for HTN, CA, anxiety.    PT Comments    Pt progressing towards physical therapy goals. Was able to tolerate increased ambulation this session with RW for support. Pt reports decreased pain (slightly) in hip and significant improvement in headache as compared to morning session. Pt continues to appear flat and withdrawn at times. Husband present during session and reports only 1 stair to enter home, so stairs deferred in favor of a longer walk. Will continue to follow.     Follow Up Recommendations  No PT follow up;Supervision for mobility/OOB     Equipment Recommendations  Rolling walker with 5" wheels    Recommendations for Other Services       Precautions / Restrictions Precautions Precautions: Fall;Cervical Precaution Booklet Issued: Yes (comment) Precaution Comments: Verbally reviewed precautions with pt during functional mobility.  Required Braces or Orthoses: Cervical Brace Cervical Brace: Soft collar Restrictions Weight Bearing Restrictions: No    Mobility  Bed Mobility Overal bed mobility: Needs Assistance Bed Mobility: Rolling;Sit to Sidelying Rolling: Modified independent (Device/Increase time)       Sit to sidelying: Supervision General bed mobility comments: VC's for log roll technique. Pt was able to complete without assistance. HOB slightly elevated.   Transfers Overall transfer level: Needs assistance Equipment used: Rolling walker (2 wheeled) Transfers: Sit to/from Stand Sit to Stand: Min guard         General transfer comment: Close guard for safety. VC's for hand placement on seated surface for safety.   Ambulation/Gait Ambulation/Gait assistance: Supervision Ambulation Distance (Feet): 175  Feet Assistive device: Rolling walker (2 wheeled) Gait Pattern/deviations: Step-through pattern;Decreased stride length;Narrow base of support Gait velocity: Decreased Gait velocity interpretation: Below normal speed for age/gender General Gait Details: Slow and very guarded. Pt limited by fatigue.    Stairs            Wheelchair Mobility    Modified Rankin (Stroke Patients Only)       Balance Overall balance assessment: Needs assistance Sitting-balance support: Feet supported;No upper extremity supported Sitting balance-Leahy Scale: Fair     Standing balance support: During functional activity;No upper extremity supported Standing balance-Leahy Scale: Fair Standing balance comment: static standing EOB                            Cognition Arousal/Alertness: Awake/alert Behavior During Therapy: Flat affect Overall Cognitive Status: Within Functional Limits for tasks assessed                                        Exercises      General Comments        Pertinent Vitals/Pain Pain Assessment: 0-10 Pain Score: 7  Pain Location: R hip Pain Descriptors / Indicators: Discomfort Pain Intervention(s): Limited activity within patient's tolerance;Monitored during session;Repositioned    Home Living                      Prior Function            PT Goals (current goals can now be found in the care plan section) Acute Rehab PT Goals Patient Stated Goal: Feel  better - home tomorrow PT Goal Formulation: With patient/family Time For Goal Achievement: 01/19/18 Potential to Achieve Goals: Good Progress towards PT goals: Progressing toward goals    Frequency    Min 5X/week      PT Plan Current plan remains appropriate    Co-evaluation              AM-PAC PT "6 Clicks" Daily Activity  Outcome Measure  Difficulty turning over in bed (including adjusting bedclothes, sheets and blankets)?: A Little Difficulty moving  from lying on back to sitting on the side of the bed? : A Little Difficulty sitting down on and standing up from a chair with arms (e.g., wheelchair, bedside commode, etc,.)?: A Little Help needed moving to and from a bed to chair (including a wheelchair)?: A Little Help needed walking in hospital room?: A Little Help needed climbing 3-5 steps with a railing? : A Lot 6 Click Score: 17    End of Session Equipment Utilized During Treatment: Gait belt;Cervical collar Activity Tolerance: Patient limited by fatigue Patient left: in chair;with call bell/phone within reach Nurse Communication: Mobility status PT Visit Diagnosis: Unsteadiness on feet (R26.81);Pain Pain - part of body: (neck; R hip)     Time: 7262-0355 PT Time Calculation (min) (ACUTE ONLY): 26 min  Charges:  $Gait Training: 23-37 mins                    G Codes:       Susan Randolph, PT, DPT Acute Rehabilitation Services Pager: [place] 01/13/2018, 1:44 PM

## 2018-01-13 NOTE — Progress Notes (Signed)
Patient continues to have difficulty with incisional pain and headaches.  No neurologic symptoms.  Has had some intermittent nausea vomiting.  She is afebrile.  Vital signs are stable.  She is awake and alert.  She is oriented and appropriate.  Motor and sensory function extremities normal.  Wound healing well.  Pain continues to limit mobility and chances for discharge home.  Continue efforts at pain control.  We will start Toradol to see if this helps.

## 2018-01-13 NOTE — Evaluation (Addendum)
Occupational Therapy Evaluation Patient Details Name: Susan Randolph MRN: [id number] DOB: 1946/07/08 Today's Date: 01/13/2018    History of Present Illness Pt is a 72 y/o female who presents s/p C1-C2 posterior cervical fusion on 01/11/18. PMH significant for HTN, CA, anxiety.   Clinical Impression   Patient is s/p C1-2 posterior fusion surgery resulting in functional limitations due to the deficits listed below (see OT problem list). Pt limited by pain and reports headache pain (light and sound sensitive). Pt was able to complete toilet sink and Eob dressing.  Patient will benefit from skilled OT acutely to increase independence and safety with ADLS to allow discharge home.  RN and daughter report patient sat in shower on unit for shower this AM after vomiting on herself.      Follow Up Recommendations  No OT follow up    Equipment Recommendations  None recommended by OT    Recommendations for Other Services       Precautions / Restrictions Precautions Precautions: Fall;Cervical Precaution Booklet Issued: Yes (comment) Precaution Comments: handout in room and reviewed for adls Required Braces or Orthoses: Cervical Brace Cervical Brace: Soft collar Restrictions Weight Bearing Restrictions: No      Mobility Bed Mobility Overal bed mobility: Needs Assistance Bed Mobility: Rolling;Sit to Sidelying Rolling: Modified independent (Device/Increase time) Sidelying to sit: Supervision     Sit to sidelying: Supervision General bed mobility comments: cues for sequence min (A) with bed rail used  Transfers Overall transfer level: Needs assistance Equipment used: Rolling walker (2 wheeled) Transfers: Sit to/from Stand Sit to Stand: Min guard         General transfer comment: Close guard for safety. VC's for hand placement on seated surface for safety.     Balance Overall balance assessment: Needs assistance Sitting-balance support: Feet supported;No upper extremity  supported Sitting balance-Leahy Scale: Fair     Standing balance support: During functional activity;No upper extremity supported Standing balance-Leahy Scale: Fair Standing balance comment: static standing EOB                           ADL either performed or assessed with clinical judgement   ADL Overall ADL's : Needs assistance/impaired Eating/Feeding: Set up;Sitting Eating/Feeding Details (indicate cue type and reason): pt with fair PO intake and declined further eating. daughter encouraging mother to continue to eat Grooming: Wash/dry hands;Min guard;Standing Grooming Details (indicate cue type and reason): sink level in bathroom           Upper Body Dressing Details (indicate cue type and reason): don doff cervical brace mod i and educated on cleaning it Lower Body Dressing: Supervision/safety;Sit to/from stand Lower Body Dressing Details (indicate cue type and reason): able to cross bil le at eob and educated on dressing this way Toilet Transfer: Min guard;Ambulation;RW;BSC   Toileting- Water quality scientist and Hygiene: Supervision/safety;Sit to/from stand       Functional mobility during ADLs: Min guard;Rolling walker General ADL Comments: pt very painful with sitting up and c/o headache over R eye   Pt advised to avoid washing directly on cut and use of clean fresh linen each shower  Vision         Perception     Praxis      Pertinent Vitals/Pain Pain Assessment: 0-10 Pain Score: 7  Pain Location: headache Pain Descriptors / Indicators: Discomfort;Headache;Constant Pain Intervention(s): Limited activity within patient's tolerance;Monitored during session;Premedicated before session;Repositioned     Hand Dominance Right  Extremity/Trunk Assessment Upper Extremity Assessment Upper Extremity Assessment: Overall WFL for tasks assessed   Lower Extremity Assessment Lower Extremity Assessment: Defer to PT evaluation   Cervical / Trunk  Assessment Cervical / Trunk Assessment: Normal   Communication Communication Communication: No difficulties   Cognition Arousal/Alertness: Awake/alert Behavior During Therapy: Flat affect Overall Cognitive Status: Within Functional Limits for tasks assessed                                     General Comments       Exercises     Shoulder Instructions      Home Living Family/patient expects to be discharged to:: Private residence Living Arrangements: Spouse/significant other Available Help at Discharge: Family;Available 24 hours/day Type of Home: House Home Access: Stairs to enter [employer] of Steps: 3-5   Home Layout: One level     Bathroom Shower/Tub: Teacher, early years/pre: Standard     Home Equipment: None   Additional Comments: will have daughters (A) upon d/c home      Prior Functioning/Environment Level of Independence: Independent                 OT Problem List: Decreased strength;Decreased activity tolerance;Impaired balance (sitting and/or standing);Decreased safety awareness;Decreased knowledge of use of DME or AE;Decreased knowledge of precautions;Pain      OT Treatment/Interventions: Self-care/ADL training;Therapeutic exercise;Energy conservation;DME and/or AE instruction;Therapeutic activities;Patient/family education;Balance training    OT Goals(Current goals can be found in the care plan section) Acute Rehab OT Goals Patient Stated Goal: to make this headache go away OT Goal Formulation: With patient Time For Goal Achievement: 01/27/18 Potential to Achieve Goals: Good  OT Frequency: Min 2X/week   Barriers to D/C:            Co-evaluation              AM-PAC PT "6 Clicks" Daily Activity     Outcome Measure Help from another person eating meals?: A Little Help from another person taking care of personal grooming?: A Little Help from another person toileting, which includes using toliet,  bedpan, or urinal?: A Little Help from another person bathing (including washing, rinsing, drying)?: A Little Help from another person to put on and taking off regular upper body clothing?: A Little Help from another person to put on and taking off regular lower body clothing?: A Little 6 Click Score: 18   End of Session Equipment Utilized During Treatment: Gait belt;Rolling walker Nurse Communication: Mobility status;Precautions  Activity Tolerance: Patient tolerated treatment well Patient left: in bed;with call bell/phone within reach;with family/visitor present  OT Visit Diagnosis: Unsteadiness on feet (R26.81)                Time: 0177-9390 OT Time Calculation (min): 25 min Charges:  OT General Charges $OT Visit: 1 Visit OT Evaluation $OT Eval Moderate Complexity: 1 Mod G-Codes:      Jeri Modena   OTR/L Pager: [phone] Office: [phone] .   Parke Poisson B 01/13/2018, 3:17 PM

## 2018-01-14 MED ORDER — OXYCODONE-ACETAMINOPHEN 5-325 MG PO TABS: 1.0000 | ORAL_TABLET | ORAL | 0 refills | Status: DC | PRN

## 2018-01-14 MED ORDER — CYCLOBENZAPRINE HCL 10 MG PO TABS: 10.0000 mg | ORAL_TABLET | Freq: Three times a day (TID) | ORAL | 0 refills | Status: DC | PRN

## 2018-01-14 NOTE — Discharge Summary (Signed)
Physician Discharge Summary  Patient ID: Susan Randolph MRN: [id number] DOB/AGE: [birth year] 72 y.o.  Admit date: 01/11/2018 Discharge date: 01/14/2018  Admission Diagnoses:  Discharge Diagnoses:  Active Problems:   Osteoarthritis of occipito-atlanto-axial spinal region   Discharged Condition: good  Hospital Course: Patient admitted to the hospital where she underwent uncomplicated N8-M7 posterior fusion with instrumentation.  Postoperative patient has done fairly well.  She has had quite a bit of difficulty with incisional pain but this is become progressively better controlled.  She is ambulatory.  She is ready for discharge home.  Consults:   Significant Diagnostic Studies:   Treatments:   Discharge Exam: Blood pressure 134/71, pulse 92, temperature 99 F (37.2 C), resp. rate 16, height 5\' 5"  (1.651 m), weight 70.3 kg (155 lb), SpO2 97 %. Awake and alert.  Oriented and appropriate.  Cranial nerve function intact.  Motor and sensory function extremities normal.  Wounds clean and dry.  Chest and abdomen benign.  Disposition: Discharge disposition: 01-Home or Self Care        Allergies as of 01/14/2018      Reactions   Sulfa Antibiotics Other (See Comments)   Doesn't remember, happened in childhood   Amitriptyline Other (See Comments)   tremors      Medication List    TAKE these medications   acetaminophen 500 MG tablet Commonly known as:  TYLENOL Take 500 mg by mouth 2 (two) times daily as needed for mild pain.   ALIVE WOMENS 50+ Tabs Take 1 tablet by mouth daily.   ALPRAZolam 0.5 MG tablet Commonly known as:  XANAX Take 0.25-0.5 mg by mouth 3 (three) times daily as needed for anxiety.   bimatoprost 0.01 % Soln Commonly known as:  LUMIGAN Place 1 drop into both eyes at bedtime.   cetirizine 10 MG tablet Commonly known as:  ZYRTEC Take 10 mg by mouth daily as needed for allergies.   cyclobenzaprine 10 MG tablet Commonly known as:  FLEXERIL Take 1  tablet (10 mg total) by mouth 3 (three) times daily as needed for muscle spasms.   famotidine 20 MG tablet Commonly known as:  PEPCID Take 20 mg by mouth daily.   fluticasone 50 MCG/ACT nasal spray Commonly known as:  FLONASE Place 2 sprays into both nostrils daily as needed for allergies or rhinitis.   gabapentin 300 MG capsule Commonly known as:  NEURONTIN Take 1 capsule by mouth at bedtime as needed for pain.   lisinopril-hydrochlorothiazide 10-12.5 MG tablet Commonly known as:  PRINZIDE,ZESTORETIC Take 1 tablet by mouth daily.   MUCINEX MAXIMUM STRENGTH 1200 MG Tb12 Generic drug:  Guaifenesin Take 2 tablets by mouth 2 (two) times daily as needed (cold symptoms).   oxyCODONE-acetaminophen 5-325 MG tablet Commonly known as:  PERCOCET/ROXICET Take 1-2 tablets by mouth every 4 (four) hours as needed for severe pain.   Vitamin D3 2000 units capsule Take 2,000 Units by mouth daily.            Durable Medical Equipment  (From admission, onward)        Start     Ordered   01/13/18 1146  For home use only DME Walker  Once    Question:  Patient needs a walker to treat with the following condition  Answer:  Unsteady gait   01/13/18 1145       Signed: Cooper Render Hermenia Fritcher 01/14/2018, 8:21 AM

## 2018-01-14 NOTE — Progress Notes (Signed)
Physical Therapy Treatment Patient Details Name: ANALEAH BRAME MRN: [id number] DOB: 10-23-1945 Today's Date: 01/14/2018    History of Present Illness Pt is a 72 y/o female who presents s/p C1-C2 posterior cervical fusion on 01/11/18. PMH significant for HTN, CA, anxiety.    PT Comments    Pt progressing towards physical therapy goals. Continues to be flat and disengaged in therapy sessions. Tolerance for functional activity is low due to pain and fatigue. Pt anticipates d/c home today. She was educated on precautions, car transfer, activity progression, and general safety with stair negotiation. Will continue to follow and progress as able per POC.   Follow Up Recommendations  No PT follow up;Supervision for mobility/OOB     Equipment Recommendations  Rolling walker with 5" wheels    Recommendations for Other Services       Precautions / Restrictions Precautions Precautions: Fall;Cervical Precaution Booklet Issued: Yes (comment) Precaution Comments: handout in room and reviewed during functional mobility Required Braces or Orthoses: Cervical Brace Cervical Brace: Soft collar Restrictions Weight Bearing Restrictions: No    Mobility  Bed Mobility Overal bed mobility: Needs Assistance Bed Mobility: Rolling;Sit to Sidelying Rolling: Modified independent (Device/Increase time)       Sit to sidelying: Supervision General bed mobility comments: VC's for proper log roll technique. Pt attempting to rush through mobility.   Transfers Overall transfer level: Needs assistance Equipment used: Rolling walker (2 wheeled) Transfers: Sit to/from Stand Sit to Stand: Supervision         General transfer comment: Close supervision for safety. Pt demonstrated proper hand placement on seated surface for safety.   Ambulation/Gait Ambulation/Gait assistance: Supervision Ambulation Distance (Feet): 175 Feet Assistive device: Rolling walker (2 wheeled) Gait Pattern/deviations:  Step-through pattern;Decreased stride length;Trunk flexed Gait velocity: Decreased Gait velocity interpretation: Below normal speed for age/gender General Gait Details: Slow and guarded. Pt reports she cannot progress distance due to pain.    Stairs Stairs: Yes   Stair Management: Two rails;Step to pattern;Forwards Number of Stairs: 3 General stair comments: VC's for sequencing and general safety.   Wheelchair Mobility    Modified Rankin (Stroke Patients Only)       Balance Overall balance assessment: Needs assistance Sitting-balance support: Feet supported;No upper extremity supported Sitting balance-Leahy Scale: Fair     Standing balance support: During functional activity;No upper extremity supported Standing balance-Leahy Scale: Fair                              Cognition Arousal/Alertness: Awake/alert Behavior During Therapy: Flat affect Overall Cognitive Status: Within Functional Limits for tasks assessed                                        Exercises      General Comments        Pertinent Vitals/Pain Pain Assessment: Faces Faces Pain Scale: Hurts even more Pain Location: Hips/buttocks Pain Descriptors / Indicators: Operative site guarding;Grimacing;Guarding Pain Intervention(s): Limited activity within patient's tolerance;Monitored during session;Repositioned    Home Living                      Prior Function            PT Goals (current goals can now be found in the care plan section) Acute Rehab PT Goals Patient Stated Goal: Home today PT Goal Formulation:  With patient/family Time For Goal Achievement: 01/19/18 Potential to Achieve Goals: Good Progress towards PT goals: Progressing toward goals    Frequency    Min 5X/week      PT Plan Current plan remains appropriate    Co-evaluation              AM-PAC PT "6 Clicks" Daily Activity  Outcome Measure  Difficulty turning over in bed  (including adjusting bedclothes, sheets and blankets)?: None Difficulty moving from lying on back to sitting on the side of the bed? : A Little Difficulty sitting down on and standing up from a chair with arms (e.g., wheelchair, bedside commode, etc,.)?: A Little Help needed moving to and from a bed to chair (including a wheelchair)?: A Little Help needed walking in hospital room?: A Little Help needed climbing 3-5 steps with a railing? : A Lot 6 Click Score: 18    End of Session Equipment Utilized During Treatment: Gait belt;Cervical collar Activity Tolerance: Patient limited by fatigue Patient left: in bed;with call bell/phone within reach Nurse Communication: Mobility status PT Visit Diagnosis: Unsteadiness on feet (R26.81);Pain Pain - part of body: (hips)     Time: 1030-1040 PT Time Calculation (min) (ACUTE ONLY): 10 min  Charges:  $Gait Training: 8-22 mins                    G Codes:       Rolinda Roan, PT, DPT Acute Rehabilitation Services Pager: [phone]    Thelma Comp 01/14/2018, 10:56 AM

## 2018-01-14 NOTE — Progress Notes (Signed)
Patient alert and oriented, mae's well, voiding adequate amount of urine, swallowing without difficulty,  c/o mild pain at time of discharge. And medication already given. Patient discharged home with family. Script and discharged instructions given to patient. Patient and family stated understanding of instructions given. Patient has an appointment with Dr. Annette Stable

## 2018-01-14 NOTE — Progress Notes (Signed)
Occupational Therapy Treatment Patient Details Name: Susan Randolph MRN: [id number] DOB: 1946-10-13 Today's Date: 01/14/2018    History of present illness Pt is a 72 y/o female who presents s/p C1-C2 posterior cervical fusion on 01/11/18. PMH significant for HTN, CA, anxiety.   OT comments  Completed all education for ADL and functional mobility for ADL. Pt safe to [place] when medically stable. No further OT needs. Needs 3in1 for safe DC home - nsg notified.   Follow Up Recommendations  No OT follow up    Equipment Recommendations  3 in 1 bedside commode    Recommendations for Other Services      Precautions / Restrictions Precautions Precautions: Fall;Cervical Precaution Booklet Issued: Yes (comment) Precaution Comments: handout in room and reviewed during functional mobility Required Braces or Orthoses: Cervical Brace Cervical Brace: Soft collar Restrictions Weight Bearing Restrictions: No       Mobility Bed Mobility   Sit to sidelying: Supervision General bed mobility comments: OOB in chair  Transfers Overall transfer level: Needs assistance Equipment used: Rolling walker (2 wheeled) Transfers: Sit to/from Stand Sit to Stand: Supervision            Balance Overall balance assessment: Needs assistance Sitting-balance support: Feet supported;No upper extremity supported Sitting balance-Leahy Scale: Good     Standing balance support: During functional activity;No upper extremity supported Standing balance-Leahy Scale: Fair                             ADL either performed or assessed with clinical judgement   ADL Overall ADL's : Needs assistance/impaired                                     Functional mobility during ADLs: Supervision/safety;Rolling walker General ADL Comments: Completed education regarding compensatory technqieus for ADL. Pt able to cross legs over knees to complete LB ADL; encouraged pt purchase a reacher for  home use; Pt concerned about getting up from her low toilet - pt would benefit from useof 3in1 over toielt. Pt also educated on use of 3in1 as shower seat. Pt verbalized understanding.      Vision       Perception     Praxis      Cognition Arousal/Alertness: Awake/alert Behavior During Therapy: Flat affect Overall Cognitive Status: Within Functional Limits for tasks assessed                                          Exercises     Shoulder Instructions       General Comments      Pertinent Vitals/ Pain       Pain Assessment: 0-10 Pain Score: 5  Faces Pain Scale: Hurts even more Pain Location: Hips/buttocks Pain Descriptors / Indicators: Operative site guarding;Grimacing;Guarding Pain Intervention(s): Limited activity within patient's tolerance  Home Living                                          Prior Functioning/Environment              Frequency           Progress Toward Goals  OT  Goals(current goals can now be found in the care plan section)  Progress towards OT goals: Goals met/education completed, patient discharged from OT  Acute Rehab OT Goals Patient Stated Goal: Home today OT Goal Formulation: With patient Time For Goal Achievement: 01/27/18 Potential to Achieve Goals: Good ADL Goals Pt Will Perform Tub/Shower Transfer: with min guard assist;ambulating;shower seat;rolling walker  Plan Discharge plan remains appropriate    Co-evaluation                 AM-PAC PT "6 Clicks" Daily Activity     Outcome Measure   Help from another person eating meals?: None Help from another person taking care of personal grooming?: None Help from another person toileting, which includes using toliet, bedpan, or urinal?: A Little Help from another person bathing (including washing, rinsing, drying)?: A Little Help from another person to put on and taking off regular upper body clothing?: A Little Help from another  person to put on and taking off regular lower body clothing?: A Little 6 Click Score: 20    End of Session Equipment Utilized During Treatment: Rolling walker;Cervical collar  OT Visit Diagnosis: Unsteadiness on feet (R26.81);Pain Pain - part of body: (hips/neck)   Activity Tolerance Patient tolerated treatment well   Patient Left in chair;with call bell/phone within reach;with family/visitor present   Nurse Communication Mobility status;Other (comment)(DC needs)        Time: 0907-0920 OT Time Calculation (min): 13 min  Charges: OT General Charges $OT Visit: 1 Visit OT Treatments $Self Care/Home Management : 8-22 mins  [facility], OT/L  [phone] 01/14/2018   Susan Randolph,HILLARY 01/14/2018, 11:18 AM

## 2018-01-14 NOTE — Discharge Instructions (Signed)

## 2018-04-30 ENCOUNTER — Other Ambulatory Visit: Payer: Self-pay | Admitting: Internal Medicine

## 2018-04-30 DIAGNOSIS — Z1231 Encounter for screening mammogram for malignant neoplasm of breast: Secondary | ICD-10-CM

## 2018-06-09 ENCOUNTER — Ambulatory Visit
Admission: RE | Admit: 2018-06-09 | Discharge: 2018-06-09 | Disposition: A | Discharge status: Home / Self Care | Payer: Medicare Other | Source: Ambulatory Visit | Attending: Internal Medicine | Admitting: Internal Medicine

## 2018-06-09 DIAGNOSIS — Z1231 Encounter for screening mammogram for malignant neoplasm of breast: Secondary | ICD-10-CM

## 2018-10-20 HISTORY — PX: BREAST LUMPECTOMY: SHX2

## 2019-03-29 HISTORY — PX: MENISCUS REPAIR: SHX5179

## 2019-06-15 ENCOUNTER — Other Ambulatory Visit: Payer: Self-pay | Admitting: Internal Medicine

## 2019-06-15 DIAGNOSIS — Z1231 Encounter for screening mammogram for malignant neoplasm of breast: Secondary | ICD-10-CM

## 2019-06-21 DIAGNOSIS — C50919 Malignant neoplasm of unspecified site of unspecified female breast: Secondary | ICD-10-CM

## 2019-06-21 HISTORY — DX: Malignant neoplasm of unspecified site of unspecified female breast: C50.919

## 2019-06-23 ENCOUNTER — Other Ambulatory Visit: Payer: Self-pay | Admitting: Obstetrics and Gynecology

## 2019-06-23 DIAGNOSIS — M8580 Other specified disorders of bone density and structure, unspecified site: Secondary | ICD-10-CM

## 2019-08-02 ENCOUNTER — Other Ambulatory Visit: Payer: Self-pay

## 2019-08-02 ENCOUNTER — Ambulatory Visit
Admission: RE | Admit: 2019-08-02 | Discharge: 2019-08-02 | Disposition: A | Discharge status: Home / Self Care | Payer: Medicare Other | Source: Ambulatory Visit | Attending: Internal Medicine | Admitting: Internal Medicine

## 2019-08-02 DIAGNOSIS — Z1231 Encounter for screening mammogram for malignant neoplasm of breast: Secondary | ICD-10-CM

## 2019-08-04 ENCOUNTER — Other Ambulatory Visit: Payer: Self-pay | Admitting: Internal Medicine

## 2019-08-04 DIAGNOSIS — R928 Other abnormal and inconclusive findings on diagnostic imaging of breast: Secondary | ICD-10-CM

## 2019-08-08 ENCOUNTER — Ambulatory Visit
Admission: RE | Admit: 2019-08-08 | Discharge: 2019-08-08 | Disposition: A | Discharge status: Home / Self Care | Payer: Medicare Other | Source: Ambulatory Visit | Attending: Internal Medicine | Admitting: Internal Medicine

## 2019-08-08 ENCOUNTER — Other Ambulatory Visit: Payer: Self-pay | Admitting: Internal Medicine

## 2019-08-08 ENCOUNTER — Other Ambulatory Visit: Payer: Self-pay

## 2019-08-08 DIAGNOSIS — N6320 Unspecified lump in the left breast, unspecified quadrant: Secondary | ICD-10-CM

## 2019-08-08 DIAGNOSIS — R928 Other abnormal and inconclusive findings on diagnostic imaging of breast: Secondary | ICD-10-CM

## 2019-08-09 ENCOUNTER — Ambulatory Visit
Admission: RE | Admit: 2019-08-09 | Discharge: 2019-08-09 | Disposition: A | Discharge status: Home / Self Care | Payer: Medicare Other | Source: Ambulatory Visit | Attending: Internal Medicine | Admitting: Internal Medicine

## 2019-08-09 ENCOUNTER — Other Ambulatory Visit: Payer: Self-pay

## 2019-08-09 ENCOUNTER — Encounter (INDEPENDENT_AMBULATORY_CARE_PROVIDER_SITE_OTHER): Payer: Self-pay

## 2019-08-09 DIAGNOSIS — N6320 Unspecified lump in the left breast, unspecified quadrant: Secondary | ICD-10-CM

## 2019-08-10 ENCOUNTER — Encounter: Payer: Self-pay | Admitting: *Deleted

## 2019-08-12 ENCOUNTER — Other Ambulatory Visit: Payer: Self-pay | Admitting: *Deleted

## 2019-08-12 DIAGNOSIS — C50412 Malignant neoplasm of upper-outer quadrant of left female breast: Secondary | ICD-10-CM

## 2019-08-16 NOTE — Progress Notes (Signed)
Susan [facility]  [phone] [phone]     ID: Audry Kauzlarich Cominsky DOB: [birth year]  MR#: [id number]  EZM#:[id number]  Patient Care Team: Josetta Huddle, MD as PCP - General (Internal Medicine) Mauro Kaufmann, RN as Oncology Nurse Navigator Rockwell Germany, RN as Oncology Nurse Navigator Alphonsa Overall, MD as Consulting Physician (General Surgery) Orphia Mctigue, Virgie Dad, MD as Consulting Physician (Oncology) Eppie Gibson, MD as Attending Physician (Radiation Oncology) Gaynelle Arabian, MD as Consulting Physician (Orthopedic Surgery) Earnie Larsson, MD as Consulting Physician (Neurosurgery) Christophe Louis, MD as Consulting Physician (Obstetrics and Gynecology) Clarene Essex, MD as Consulting Physician (Gastroenterology) Chauncey Cruel, MD OTHER MD:  CHIEF COMPLAINT: estrogen receptor positive breast cancer  CURRENT TREATMENT: Awaiting definitive surgery   HISTORY OF CURRENT ILLNESS: Eevee Borbon Tafoya had routine screening mammography on 08/02/2019 showing a possible abnormality in the left breast. She underwent left diagnostic mammography with tomography and left breast ultrasonography at [facility] on 08/08/2019 showing: breast density category B; 6 mm mass in the left breast at 12 o'clock; no enlarged or abnormal left axillary lymph nodes.  Accordingly on 08/09/2019 she proceeded to biopsy of the left breast area in question. The pathology from this procedure ([id number]) showed: invasive mammary carcinoma, grade 2, e-cadherin positive. Prognostic indicators significant for: estrogen receptor, 100% positive and progesterone receptor, 95% positive, both with strong staining intensity. Proliferation marker Ki67 at 2%. HER2 equivocal by immunohistochemistry (2+), but negative by fluorescent in situ hybridization with a signals ratio 1.19 and number per cell 1.85.  The patient's subsequent history is as detailed below.   INTERVAL HISTORY: Marizol was evaluated in  the multidisciplinary breast cancer clinic on 08/17/2019 . Her case was also presented at the multidisciplinary breast cancer conference on the same day. At that time a preliminary plan was proposed: Breast conserving surgery with sentinel lymph node sampling, no Oncotype, antiestrogens, consideration of adjuvant radiation.  She is scheduled for bone density testing on 08/30/2019.   REVIEW OF SYSTEMS: Jeannemarie reports weight change, loss of sleep, knee and back pain, wearing glasses, trace hematuria, history of skin cancer (3 times), and joint pain and arthritis that causes difficulty walking. She had questions about nutrition.  Otherwise there were no specific symptoms leading to the original mammogram, which was routinely scheduled. The patient denies unusual headaches, visual changes, nausea, vomiting, stiff neck, or dizziness. There has been no cough, phlegm production, or pleurisy, no chest pain or pressure, and no change in bowel habits. The patient denies fever, rash, or unexplained fatigue. A detailed review of systems was otherwise entirely negative.   PAST MEDICAL HISTORY: Past Medical History:  Diagnosis Date   Anxiety    Arthritis    Cancer ([place])    basal cell carcinoma   GERD (gastroesophageal reflux disease)    Headache    Hypertension    Rectal bleed 09/2016    PAST SURGICAL HISTORY: Past Surgical History:  Procedure Laterality Date   EYE SURGERY     Corrected drooping eye lids bilateral   MENISCUS REPAIR Right 03/29/2019   POSTERIOR CERVICAL FUSION/FORAMINOTOMY N/A 01/11/2018   Procedure: Posterior Cervical Fusion with lateral mass fixation - Cervical one-Cervical two with Iliac Crest bone graft;  Surgeon: Earnie Larsson, MD;  Location: [facility];  Service: Neurosurgery;  Laterality: N/A;   TUBAL LIGATION      FAMILY HISTORY: Family History  Problem Relation Age of Onset   Cancer Mother        breast cancer/endometrial  cancer   Hypertension Mother    Breast  cancer Mother    Hypertension Father    Cancer Father        Glioblastoma   Arthritis Father    Rheum arthritis Maternal Uncle    Colon cancer Maternal Uncle    Patient's father was 27 years old when he died from [place]. Patient's mother is 27 years old as of 07/2019.  She was diagnosed with breast cancer at age 28 and with endometrial cancer at age 78. The patient denies a family hx of ovarian cancer. She has no siblings. She also notes a maternal uncle with colon cancer in his 72s.   GYNECOLOGIC HISTORY:  No LMP recorded. Patient is postmenopausal. Menarche: 73 years old Age at first live birth: 73 years old Ezel P 3 LMP age 55 Contraceptive yes, unsure how long, no issues HRT yes, used for 5 years  Hysterectomy? no BSO? no   SOCIAL HISTORY: (updated 07/2019)  Shreena is currently retired from working as a [employer].  Husband Nicolette Bang") is a retired Clinical biochemist. She lives at home with [name]. Daughter [name], age 72, is a homemaker in  [place]. Daughter [name], age 66, is a Marine scientist at the [employer] hospital in [facility]. The patient lost her son [name] to a car accident in 2015.  The patient has 3 grandchildren.  She attends [employer].    ADVANCED DIRECTIVES: In the absence of any documentation to the contrary, the patient's spouse is her 2.   HEALTH MAINTENANCE: Social History   Tobacco Use   Smoking status: Never Smoker   Smokeless tobacco: Never Used  Substance Use Topics   Alcohol use: Yes    Comment: rarely   Drug use: No     Colonoscopy: 2014  PAP: 03/2017, negative  Bone density: scheduled 08/2019   Allergies  Allergen Reactions   Sulfa Antibiotics Other (See Comments)    Doesn't remember, happened in childhood   Amitriptyline Other (See Comments)    tremors    Current Outpatient Medications  Medication Sig Dispense Refill   lisinopril-hydrochlorothiazide (PRINZIDE,ZESTORETIC) 10-12.5 MG tablet  Take 1 tablet by mouth daily.  9   Multiple Vitamins-Minerals (ALIVE WOMENS 50+) TABS Take 1 tablet by mouth daily.     traZODone (DESYREL) 50 MG tablet Take 50 mg by mouth at bedtime.     acetaminophen (TYLENOL) 500 MG tablet Take 500 mg by mouth 2 (two) times daily as needed for mild pain.      ALPRAZolam (XANAX) 0.5 MG tablet Take 0.25-0.5 mg by mouth 3 (three) times daily as needed for anxiety.     bimatoprost (LUMIGAN) 0.01 % SOLN Place 1 drop into both eyes at bedtime.      cetirizine (ZYRTEC) 10 MG tablet Take 10 mg by mouth daily as needed for allergies.     Cholecalciferol (VITAMIN D3) 2000 units capsule Take 2,000 Units by mouth daily.      cyclobenzaprine (FLEXERIL) 10 MG tablet Take 1 tablet (10 mg total) by mouth 3 (three) times daily as needed for muscle spasms. (Patient not taking: Reported on 08/17/2019) 30 tablet 0   fluticasone (FLONASE) 50 MCG/ACT nasal spray Place 2 sprays into both nostrils daily as needed for allergies or rhinitis.     gabapentin (NEURONTIN) 300 MG capsule Take 1 capsule by mouth at bedtime as needed for pain.  3   Guaifenesin (MUCINEX MAXIMUM STRENGTH) 1200 MG TB12 Take 2 tablets by mouth  2 (two) times daily as needed (cold symptoms).     oxyCODONE-acetaminophen (PERCOCET/ROXICET) 5-325 MG tablet Take 1-2 tablets by mouth every 4 (four) hours as needed for severe pain. 60 tablet 0   No current facility-administered medications for this visit.     OBJECTIVE: Middle-aged white woman in no acute distress  Vitals:   08/17/19 0910  BP: (!) 149/79  Pulse: 65  Resp: 18  Temp: 97.8 F (36.6 C)  SpO2: 100%     Body mass index is 25.19 kg/m.   Wt Readings from Last 3 Encounters:  08/17/19 142 lb 3.2 oz (64.5 kg)  01/11/18 155 lb (70.3 kg)  01/04/18 155 lb 8 oz (70.5 kg)      ECOG FS:0 - Asymptomatic  Ocular: Sclerae unicteric, pupils round and equal Ear-nose-throat: Wearing a mask Lymphatic: No cervical or supraclavicular  adenopathy Lungs no rales or rhonchi Heart regular rate and rhythm Abd soft, nontender, positive bowel sounds MSK no focal spinal tenderness, no joint edema Neuro: non-focal, well-oriented, appropriate affect Breasts: Right breast is unremarkable.  The left breast is status post recent biopsy.  There is a minimal ecchymosis.  I do not palpate a mass.  There are no skin or nipple changes of concern.  Both axillae are benign.   LAB RESULTS:  CMP     Component Value Date/Time   NA 140 08/17/2019 0846   K 3.8 08/17/2019 0846   CL 103 08/17/2019 0846   CO2 26 08/17/2019 0846   GLUCOSE 99 08/17/2019 0846   BUN 22 08/17/2019 0846   CREATININE 0.83 08/17/2019 0846   CALCIUM 10.5 (H) 08/17/2019 0846   PROT 7.2 08/17/2019 0846   ALBUMIN 4.5 08/17/2019 0846   AST 16 08/17/2019 0846   ALT 17 08/17/2019 0846   ALKPHOS 64 08/17/2019 0846   BILITOT 0.4 08/17/2019 0846   GFRNONAA >60 08/17/2019 0846   GFRAA >60 08/17/2019 0846    No results found for: TOTALPROTELP, ALBUMINELP, A1GS, A2GS, BETS, BETA2SER, GAMS, MSPIKE, SPEI  No results found for: KPAFRELGTCHN, LAMBDASER, [facility]  Lab Results  Component Value Date   WBC 4.6 08/17/2019   NEUTROABS 2.8 08/17/2019   HGB 12.9 08/17/2019   HCT 39.1 08/17/2019   MCV 90.1 08/17/2019   PLT 204 08/17/2019    _0 @  No results found for: LABCA2  No components found for: HYWVPX106  No results for input(s): INR in the last 168 hours.  No results found for: LABCA2  No results found for: YIR485  No results found for: IOE703  No results found for: JKK938  No results found for: CA2729  No components found for: HGQUANT  No results found for: CEA1 / No results found for: CEA1   No results found for: AFPTUMOR  No results found for: CHROMOGRNA  No results found for: PSA1  Appointment on 08/17/2019  Component Date Value Ref Range Status   Sodium 08/17/2019 140  135 - 145 mmol/L Final   Potassium 08/17/2019 3.8   3.5 - 5.1 mmol/L Final   Chloride 08/17/2019 103  98 - 111 mmol/L Final   CO2 08/17/2019 26  22 - 32 mmol/L Final   Glucose, Bld 08/17/2019 99  70 - 99 mg/dL Final   BUN 08/17/2019 22  8 - 23 mg/dL Final   Creatinine 08/17/2019 0.83  0.44 - 1.00 mg/dL Final   Calcium 08/17/2019 10.5* 8.9 - 10.3 mg/dL Final   Total Protein 08/17/2019 7.2  6.5 - 8.1 g/dL Final   Albumin 08/17/2019  4.5  3.5 - 5.0 g/dL Final   AST 08/17/2019 16  15 - 41 U/L Final   ALT 08/17/2019 17  0 - 44 U/L Final   Alkaline Phosphatase 08/17/2019 64  38 - 126 U/L Final   Total Bilirubin 08/17/2019 0.4  0.3 - 1.2 mg/dL Final   GFR, Est Non Af Am 08/17/2019 >60  >60 mL/min Final   GFR, Est AFR Am 08/17/2019 >60  >60 mL/min Final   Anion gap 08/17/2019 11  5 - 15 Final   Performed at [facility], [place] [address] [place], [place] [address]   WBC Count 08/17/2019 4.6  4.0 - 10.5 K/uL Final   RBC 08/17/2019 4.34  3.87 - 5.11 MIL/uL Final   Hemoglobin 08/17/2019 12.9  12.0 - 15.0 g/dL Final   HCT 08/17/2019 39.1  36.0 - 46.0 % Final   MCV 08/17/2019 90.1  80.0 - 100.0 fL Final   MCH 08/17/2019 29.7  26.0 - 34.0 pg Final   MCHC 08/17/2019 33.0  30.0 - 36.0 g/dL Final   RDW 08/17/2019 11.9  11.5 - 15.5 % Final   Platelet Count 08/17/2019 204  150 - 400 K/uL Final   nRBC 08/17/2019 0.0  0.0 - 0.2 % Final   Neutrophils Relative % 08/17/2019 60  % Final   Neutro Abs 08/17/2019 2.8  1.7 - 7.7 K/uL Final   Lymphocytes Relative 08/17/2019 32  % Final   Lymphs Abs 08/17/2019 1.5  0.7 - 4.0 K/uL Final   Monocytes Relative 08/17/2019 6  % Final   Monocytes Absolute 08/17/2019 0.3  0.1 - 1.0 K/uL Final   Eosinophils Relative 08/17/2019 2  % Final   Eosinophils Absolute 08/17/2019 0.1  0.0 - 0.5 K/uL Final   Basophils Relative 08/17/2019 0  % Final   Basophils Absolute 08/17/2019 0.0  0.0 - 0.1 K/uL Final   Immature Granulocytes 08/17/2019 0  % Final   Abs Immature  Granulocytes 08/17/2019 0.01  0.00 - 0.07 K/uL Final   Performed at [facility], [place] [address] [place], [place] [address]    (this displays the last labs from the last 3 days)  No results found for: TOTALPROTELP, ALBUMINELP, A1GS, A2GS, BETS, BETA2SER, GAMS, MSPIKE, SPEI (this displays SPEP labs)  No results found for: KPAFRELGTCHN, LAMBDASER, KAPLAMBRATIO (kappa/lambda light chains)  No results found for: HGBA, HGBA2QUANT, HGBFQUANT, HGBSQUAN (Hemoglobinopathy evaluation)   No results found for: LDH  No results found for: IRON, TIBC, IRONPCTSAT (Iron and TIBC)  No results found for: FERRITIN  Urinalysis No results found for: COLORURINE, APPEARANCEUR, LABSPEC, PHURINE, GLUCOSEU, HGBUR, BILIRUBINUR, KETONESUR, PROTEINUR, UROBILINOGEN, NITRITE, LEUKOCYTESUR   STUDIES: US Breast Ltd Uni Left Inc Axilla  Result Date: 08/08/2019 CLINICAL DATA:  Screening recall for possible left breast asymmetry. Patient's mother was diagnosed with breast carcinoma at approximately the patient's current age. EXAM: DIGITAL DIAGNOSTIC LEFT MAMMOGRAM WITH CAD AND TOMO ULTRASOUND LEFT BREAST COMPARISON:  Previous exam(s). ACR Breast Density Category b: There are scattered areas of fibroglandular density. FINDINGS: On the diagnostic spot-compression images the possible asymmetry persists. It appears as an ill-defined mass on both the CC and MLO spot compression images, in the upper, posterior aspect of the left breast. Mammographic images were processed with CAD. On physical exam, no mass is palpated in the upper left breast. Targeted ultrasound is performed, showing a small hypoechoic mass with partly ill-defined margins as well as some angular and irregular margins in the posterior left breast  at 12 o'clock, 3 cm the nipple, measuring 6 x 4 x 5 mm, consistent in size, shape and location to the mammographic finding. Sonographic evaluation of the left axilla shows no enlarged or  abnormal lymph nodes. IMPRESSION: 1. 6 mm mass in the 12 o'clock position of the left breast, 3 cm the nipple, posterior depth, suspicious for a breast malignancy. RECOMMENDATION: 1. Ultrasound-guided core needle biopsy of the left breast mass. This procedure has been scheduled for 08/10/2019. I have discussed the findings and recommendations with the patient. If applicable, a reminder letter will be sent to the patient regarding the next appointment. BI-RADS CATEGORY  4: Suspicious. Electronically Signed   By: Lajean Manes M.D.   On: 08/08/2019 15:02   Mm Diag Breast Tomo Uni Left  Result Date: 08/08/2019 CLINICAL DATA:  Screening recall for possible left breast asymmetry. Patient's mother was diagnosed with breast carcinoma at approximately the patient's current age. EXAM: DIGITAL DIAGNOSTIC LEFT MAMMOGRAM WITH CAD AND TOMO ULTRASOUND LEFT BREAST COMPARISON:  Previous exam(s). ACR Breast Density Category b: There are scattered areas of fibroglandular density. FINDINGS: On the diagnostic spot-compression images the possible asymmetry persists. It appears as an ill-defined mass on both the CC and MLO spot compression images, in the upper, posterior aspect of the left breast. Mammographic images were processed with CAD. On physical exam, no mass is palpated in the upper left breast. Targeted ultrasound is performed, showing a small hypoechoic mass with partly ill-defined margins as well as some angular and irregular margins in the posterior left breast at 12 o'clock, 3 cm the nipple, measuring 6 x 4 x 5 mm, consistent in size, shape and location to the mammographic finding. Sonographic evaluation of the left axilla shows no enlarged or abnormal lymph nodes. IMPRESSION: 1. 6 mm mass in the 12 o'clock position of the left breast, 3 cm the nipple, posterior depth, suspicious for a breast malignancy. RECOMMENDATION: 1. Ultrasound-guided core needle biopsy of the left breast mass. This procedure has been scheduled  for 08/10/2019. I have discussed the findings and recommendations with the patient. If applicable, a reminder letter will be sent to the patient regarding the next appointment. BI-RADS CATEGORY  4: Suspicious. Electronically Signed   By: Lajean Manes M.D.   On: 08/08/2019 15:02   Mm 3d Screen Breast Bilateral  Result Date: 08/03/2019 CLINICAL DATA:  Screening. EXAM: DIGITAL SCREENING BILATERAL MAMMOGRAM WITH TOMO AND CAD COMPARISON:  Previous exam(s). ACR Breast Density Category b: There are scattered areas of fibroglandular density. FINDINGS: In the left breast, a possible asymmetry warrants further evaluation. In the right breast, no findings suspicious for malignancy. Images were processed with CAD. IMPRESSION: Further evaluation is suggested for possible asymmetry in the left breast. RECOMMENDATION: Diagnostic mammogram and possibly ultrasound of the left breast. (Code:[id number]) The patient will be contacted regarding the findings, and additional imaging will be scheduled. BI-RADS CATEGORY  0: Incomplete. Need additional imaging evaluation and/or prior mammograms for comparison. Electronically Signed   By: Kristopher Oppenheim M.D.   On: 08/03/2019 12:49   Mm Clip Placement Left  Addendum Date: 08/11/2019   ADDENDUM REPORT: 08/10/2019 14:46 ADDENDUM: Pathology revealed GRADE II INVASIVE MAMMARY CARCINOMA of the LEFT breast, upper, 12 o'clock position. This was found to be concordant by Dr. Hassan Rowan. Pathology results were discussed with the patient by telephone. The patient reported doing well after the biopsy with tenderness at the site. Post biopsy instructions and care were reviewed and questions were answered. The patient was encouraged  to call [facility] for any additional concerns. The patient was referred to [facility] at [facility] on August 17, 2019. Pathology results reported by Stacie Acres, RN on  08/10/2019. Electronically Signed   By: Margarette Canada M.D.   On: 08/10/2019 14:46   Result Date: 08/11/2019 CLINICAL DATA:  73 year old female for tissue sampling of 0.6 cm UPPER LEFT breast mass. EXAM: ULTRASOUND GUIDED LEFT BREAST CORE NEEDLE BIOPSY 3D LEFT MAMMOGRAM POST ULTRASOUND BIOPSY COMPARISON:  Previous exam(s). FINDINGS: I met with the patient and we discussed the procedure of ultrasound-guided biopsy, including benefits and alternatives. We discussed the high likelihood of a successful procedure. We discussed the risks of the procedure, including infection, bleeding, tissue injury, clip migration, and inadequate sampling. Informed written consent was given. The usual time-out protocol was performed immediately prior to the procedure. Using sterile technique and 1% Lidocaine as local anesthetic, under direct ultrasound visualization, a 12 gauge spring-loaded device was used to perform biopsy of the 0.6 cm mass at the 12 o'clock position of the LEFT breast 3 cm from the nipple using a MEDIAL approach. At the conclusion of the procedure a RIBBON tissue marker clip was deployed into the biopsy cavity. 3D Full field views of the LEFT breast demonstrate satisfactory position of the RIBBON clip within the UPPER LEFT breast at the site of the biopsied mass. The mass biopsied sonographically corresponds to the mass identified mammographically. IMPRESSION: Ultrasound guided biopsy of 0.6 cm UPPER LEFT breast mass. No apparent complications. Satisfactory position of RIBBON clip following ultrasound-guided LEFT breast biopsy. Electronically Signed: By: Margarette Canada M.D. On: 08/09/2019 12:01   [place] Lt Breast Bx W Loc Dev 1st Lesion Img Bx Spec US Guide  Addendum Date: 08/11/2019   ADDENDUM REPORT: 08/10/2019 14:46 ADDENDUM: Pathology revealed GRADE II INVASIVE MAMMARY CARCINOMA of the LEFT breast, upper, 12 o'clock position. This was found to be concordant by Dr. Hassan Rowan. Pathology results were discussed with the  patient by telephone. The patient reported doing well after the biopsy with tenderness at the site. Post biopsy instructions and care were reviewed and questions were answered. The patient was encouraged to call [facility] for any additional concerns. The patient was referred to [facility] on August 17, 2019. Pathology results reported by Stacie Acres, RN on 08/10/2019. Electronically Signed   By: Margarette Canada M.D.   On: 08/10/2019 14:46   Result Date: 08/11/2019 CLINICAL DATA:  73 year old female for tissue sampling of 0.6 cm UPPER LEFT breast mass. EXAM: ULTRASOUND GUIDED LEFT BREAST CORE NEEDLE BIOPSY 3D LEFT MAMMOGRAM POST ULTRASOUND BIOPSY COMPARISON:  Previous exam(s). FINDINGS: I met with the patient and we discussed the procedure of ultrasound-guided biopsy, including benefits and alternatives. We discussed the high likelihood of a successful procedure. We discussed the risks of the procedure, including infection, bleeding, tissue injury, clip migration, and inadequate sampling. Informed written consent was given. The usual time-out protocol was performed immediately prior to the procedure. Using sterile technique and 1% Lidocaine as local anesthetic, under direct ultrasound visualization, a 12 gauge spring-loaded device was used to perform biopsy of the 0.6 cm mass at the 12 o'clock position of the LEFT breast 3 cm from the nipple using a MEDIAL approach. At the conclusion of the procedure a RIBBON tissue marker clip was deployed into the biopsy cavity. 3D Full field views  of the LEFT breast demonstrate satisfactory position of the RIBBON clip within the UPPER LEFT breast at the site of the biopsied mass. The mass biopsied sonographically corresponds to the mass identified mammographically. IMPRESSION: Ultrasound guided biopsy of 0.6 cm UPPER LEFT breast mass. No apparent complications. Satisfactory  position of RIBBON clip following ultrasound-guided LEFT breast biopsy. Electronically Signed: By: Margarette Canada M.D. On: 08/09/2019 12:01    ELIGIBLE FOR AVAILABLE RESEARCH PROTOCOL: no  ASSESSMENT: 73 y.o.  woman status post left breast upper outer quadrant biopsy 08/09/2019 for a clinical T1b N0, stage IA invasive ductal carcinoma, grade 2, estrogen and progesterone receptor positive, HER-2 not amplified, with an MIB-1 of 2%.  PLAN: I spent approximately 60 minutes face to face with Brena with more than 50% of that time spent in counseling and coordination of care. Specifically we reviewed the biology of the patient's diagnosis and the specifics of her situation.  We first reviewed the fact that cancer is not one disease but more than 100 different diseases and that it is important to keep them separate-- otherwise when friends and relatives discuss their own cancer experiences with Rosalea confusion can result. Similarly we explained that if breast cancer spreads to the bone or liver, the patient would not have bone cancer or liver cancer, but breast cancer in the bone and breast cancer in the liver: one cancer in three places-- not 3 different cancers which otherwise would have to be treated in 3 different ways.  We discussed the difference between local and systemic therapy. In terms of loco-regional treatment, lumpectomy plus radiation is equivalent to mastectomy as far as survival is concerned. For this reason, and because the cosmetic results are generally superior, we recommend breast conserving surgery.   We then discussed the rationale for systemic therapy. There is some risk that this cancer may have already spread to other parts of her body. Patients frequently ask at this point about bone scans, CAT scans and PET scans to find out if they have occult breast cancer somewhere else. The problem is that in early stage disease we are much more likely to find false positives then true  cancers and this would expose the patient to unnecessary procedures as well as unnecessary radiation. Scans cannot answer the question the patient really would like to know, which is whether she has microscopic disease elsewhere in her body. For those reasons we do not recommend them.  Of course we would proceed to aggressive evaluation of any symptoms that might suggest metastatic disease, but that is not the case here.  Next we went over the options for systemic therapy which are anti-estrogens, anti-HER-2 immunotherapy, and chemotherapy. Melicia does not meet criteria for anti-HER-2 immunotherapy. She is a good candidate for anti-estrogens.  The question of chemotherapy is more complicated. Chemotherapy is most effective in rapidly growing, aggressive tumors. It is much less effective in low-grade, slow growing cancers, like Tsion 's. For that reason and given the patient's age and tumor size we are going to forego Oncotype testing and depend on antiestrogens alone for systemic treatment.  The plan then is to start with surgery, then adjuvant radiation, then antiestrogens. Ilianna has a good understanding of the overall plan. She agrees with it. She knows the goal of treatment in her case is cure. She will call with any problems that may develop before her next visit here.   Chauncey Cruel, MD   08/21/2019 3:30 PM Medical Oncology and Hematology [facility]  [place], [place] [address] Tel. [phone]    Fax. [phone]   This document serves as a record of services personally performed by Lurline Del, MD. It was created on his behalf by Wilburn Mylar, a trained medical scribe. The creation of this record is based on the scribe's personal observations and the provider's statements to them.   I, Lurline Del MD, have reviewed the above documentation for accuracy and completeness, and I agree with the above.

## 2019-08-17 ENCOUNTER — Encounter: Payer: Self-pay | Admitting: Physical Therapy

## 2019-08-17 ENCOUNTER — Inpatient Hospital Stay: Payer: Medicare Other

## 2019-08-17 ENCOUNTER — Ambulatory Visit: Payer: Medicare Other | Attending: Surgery | Admitting: Physical Therapy

## 2019-08-17 ENCOUNTER — Encounter: Payer: Self-pay | Admitting: Oncology

## 2019-08-17 ENCOUNTER — Ambulatory Visit
Admission: RE | Admit: 2019-08-17 | Discharge: 2019-08-17 | Disposition: A | Discharge status: Home / Self Care | Payer: Medicare Other | Source: Ambulatory Visit | Attending: Radiation Oncology | Admitting: Radiation Oncology

## 2019-08-17 ENCOUNTER — Inpatient Hospital Stay: Payer: Medicare Other | Attending: Oncology | Admitting: Oncology

## 2019-08-17 ENCOUNTER — Other Ambulatory Visit (HOSPITAL_COMMUNITY): Payer: Self-pay | Admitting: Surgery

## 2019-08-17 ENCOUNTER — Other Ambulatory Visit: Payer: Self-pay

## 2019-08-17 VITALS — BP 149/79 | HR 65 | Temp 97.8°F | Resp 18 | Ht 63.0 in | Wt 142.2 lb

## 2019-08-17 DIAGNOSIS — M1990 Unspecified osteoarthritis, unspecified site: Secondary | ICD-10-CM | POA: Insufficient documentation

## 2019-08-17 DIAGNOSIS — Z170 Estrogen receptor positive status [ER+]: Secondary | ICD-10-CM

## 2019-08-17 DIAGNOSIS — K219 Gastro-esophageal reflux disease without esophagitis: Secondary | ICD-10-CM | POA: Diagnosis not present

## 2019-08-17 DIAGNOSIS — I10 Essential (primary) hypertension: Secondary | ICD-10-CM | POA: Insufficient documentation

## 2019-08-17 DIAGNOSIS — C50912 Malignant neoplasm of unspecified site of left female breast: Secondary | ICD-10-CM

## 2019-08-17 DIAGNOSIS — Z79899 Other long term (current) drug therapy: Secondary | ICD-10-CM | POA: Diagnosis not present

## 2019-08-17 DIAGNOSIS — F419 Anxiety disorder, unspecified: Secondary | ICD-10-CM | POA: Diagnosis not present

## 2019-08-17 DIAGNOSIS — C50412 Malignant neoplasm of upper-outer quadrant of left female breast: Secondary | ICD-10-CM | POA: Insufficient documentation

## 2019-08-17 DIAGNOSIS — R293 Abnormal posture: Secondary | ICD-10-CM | POA: Diagnosis present

## 2019-08-17 LAB — CMP (CANCER CENTER ONLY)
ALT: 17 U/L (ref 0–44)
AST: 16 U/L (ref 15–41)
Albumin: 4.5 g/dL (ref 3.5–5.0)
Alkaline Phosphatase: 64 U/L (ref 38–126)
Anion gap: 11 (ref 5–15)
BUN: 22 mg/dL (ref 8–23)
CO2: 26 mmol/L (ref 22–32)
Calcium: 10.5 mg/dL — ABNORMAL HIGH (ref 8.9–10.3)
Chloride: 103 mmol/L (ref 98–111)
Creatinine: 0.83 mg/dL (ref 0.44–1.00)
GFR, Est AFR Am: >60 mL/min (ref >60)
GFR, Estimated: >60 mL/min (ref >60)
Glucose, Bld: 99 mg/dL (ref 70–99)
Potassium: 3.8 mmol/L (ref 3.5–5.1)
Sodium: 140 mmol/L (ref 135–145)
Total Bilirubin: 0.4 mg/dL (ref 0.3–1.2)
Total Protein: 7.2 g/dL (ref 6.5–8.1)

## 2019-08-17 LAB — CBC WITH DIFFERENTIAL (CANCER CENTER ONLY)
Abs Immature Granulocytes: 0.01 10*3/uL (ref 0.00–0.07)
Basophils Absolute: 0 10*3/uL (ref 0.0–0.1)
Basophils Relative: 0 %
Eosinophils Absolute: 0.1 10*3/uL (ref 0.0–0.5)
Eosinophils Relative: 2 %
HCT: 39.1 % (ref 36.0–46.0)
Hemoglobin: 12.9 g/dL (ref 12.0–15.0)
Immature Granulocytes: 0 %
Lymphocytes Relative: 32 %
Lymphs Abs: 1.5 10*3/uL (ref 0.7–4.0)
MCH: 29.7 pg (ref 26.0–34.0)
MCHC: 33 g/dL (ref 30.0–36.0)
MCV: 90.1 fL (ref 80.0–100.0)
Monocytes Absolute: 0.3 10*3/uL (ref 0.1–1.0)
Monocytes Relative: 6 %
Neutro Abs: 2.8 10*3/uL (ref 1.7–7.7)
Neutrophils Relative %: 60 %
Platelet Count: 204 10*3/uL (ref 150–400)
RBC: 4.34 MIL/uL (ref 3.87–5.11)
RDW: 11.9 % (ref 11.5–15.5)
WBC Count: 4.6 10*3/uL (ref 4.0–10.5)
nRBC: 0 % (ref 0.0–0.2)

## 2019-08-17 NOTE — Patient Instructions (Signed)

## 2019-08-17 NOTE — Therapy (Signed)
[place], [place], [address] Phone: [phone]   Fax:  [phone]  Physical Therapy Evaluation  Patient Details  Name: Susan Randolph MRN: [id number] Date of Birth: 1946-06-05 Referring Provider (PT): Dr. Alphonsa Overall   Encounter Date: 08/17/2019  PT End of Session - 08/17/19 1145    Visit Number  1    Number of Visits  2    Date for PT Re-Evaluation  10/12/19    PT Start Time  0918    PT Stop Time  0943    PT Time Calculation (min)  25 min    Activity Tolerance  Patient tolerated treatment well    Behavior During Therapy  [facility] for tasks assessed/performed       Past Medical History:  Diagnosis Date  . Anxiety   . Arthritis   . Cancer ([place])    basal cell carcinoma  . GERD (gastroesophageal reflux disease)   . Headache   . Hypertension   . Rectal bleed 09/2016    Past Surgical History:  Procedure Laterality Date  . EYE SURGERY     Corrected drooping eye lids bilateral  . MENISCUS REPAIR Right 03/29/2019  . POSTERIOR CERVICAL FUSION/FORAMINOTOMY N/A 01/11/2018   Procedure: Posterior Cervical Fusion with lateral mass fixation - Cervical one-Cervical two with Iliac Crest bone graft;  Surgeon: Earnie Larsson, MD;  Location: [facility];  Service: Neurosurgery;  Laterality: N/A;  . TUBAL LIGATION      There were no vitals filed for this visit.   Subjective Assessment - 08/17/19 1135    Subjective  Patient reports she is here today to be seen by hre medical team for her newly diagnosed left breast cancer.    Pertinent History  Patient was diagnosed on 08/02/2019 with left grade II invasive ductal carcinoma breast cancer. It measures 6 mm and is located in the upper outer quadrant. It is ER/PR positive and HER negative with a Ki67 of 2%. She had a previous right knee meniscal repair on 03/29/2019 and has been undergoing PT for that with little success. She had a cervical fusion at C1-C2 in March 2019.     Patient Stated Goals  Reduce lymphedema risk and learn post op shoulder ROM HEP    Currently in Pain?  Yes    Pain Score  4     Pain Location  Knee    Pain Orientation  Right    Pain Descriptors / Indicators  Aching    Pain Type  Surgical pain    Pain Onset  More than a month ago    Pain Frequency  Intermittent    Aggravating Factors   Walking    Pain Relieving Factors  rest    Multiple Pain Sites  No         [facility] PT Assessment - 08/17/19 0001      Assessment   Medical Diagnosis  Left breast cancer    Referring Provider (PT)  Dr. Alphonsa Overall    Onset Date/Surgical Date  08/02/19    Hand Dominance  Right    Prior Therapy  none      Precautions   Precautions  Other (comment)    Precaution Comments  active cancer      Restrictions   Weight Bearing Restrictions  No      Balance Screen   Has the patient fallen in the past 6 months  No    Has the patient had a decrease  in activity level because of a fear of falling?   No    Is the patient reluctant to leave their home because of a fear of falling?   No      Home Film/video editor residence    Living Arrangements  Spouse/significant other    Available Help at Discharge  Family      Prior Function   Level of [place]  Retired    Biomedical scientist  Retired Education officer, museum    Leisure  She does not exercise      Cognition   Overall Cognitive Status  Within Functional Limits for tasks assessed      Posture/Postural Control   Posture/Postural Control  Postural limitations    Postural Limitations  Rounded Shoulders;Forward head      ROM / Strength   AROM / PROM / Strength  AROM;Strength      AROM   Overall AROM Comments  All cervical AROM limited 75% except flexion is limited 25% - due to previous cervical fusion    AROM Assessment Site  Shoulder    Right/Left Shoulder  Right;Left    Right Shoulder Extension  54 Degrees    Right Shoulder Flexion  153 Degrees     Right Shoulder ABduction  153 Degrees    Right Shoulder Internal Rotation  80 Degrees    Right Shoulder External Rotation  77 Degrees    Left Shoulder Extension  50 Degrees    Left Shoulder Flexion  147 Degrees    Left Shoulder ABduction  161 Degrees    Left Shoulder Internal Rotation  43 Degrees   Painful   Left Shoulder External Rotation  60 Degrees      Strength   Overall Strength  Within functional limits for tasks performed        LYMPHEDEMA/ONCOLOGY QUESTIONNAIRE - 08/17/19 1142      Type   Cancer Type  Left breast cancer      Lymphedema Assessments   Lymphedema Assessments  Upper extremities      Right Upper Extremity Lymphedema   10 cm Proximal to Olecranon Process  26.5 cm    Olecranon Process  24.2 cm    10 cm Proximal to Ulnar Styloid Process  21.8 cm    Just Proximal to Ulnar Styloid Process  15.3 cm    Across Hand at [employer]  17.7 cm    At [place] of 2nd Digit  6.3 cm      Left Upper Extremity Lymphedema   10 cm Proximal to Olecranon Process  27.5 cm    Olecranon Process  23.9 cm    10 cm Proximal to Ulnar Styloid Process  21.1 cm    Just Proximal to Ulnar Styloid Process  15 cm    Across Hand at [employer]  17.6 cm    At [place] of 2nd Digit  6 cm             Objective measurements completed on examination: See above findings.      Patient was instructed today in a home exercise program today for post op shoulder range of motion. These included active assist shoulder flexion in sitting, scapular retraction, wall walking with shoulder abduction, and hands behind head external rotation.  She was encouraged to do these twice a day, holding 3 seconds and repeating 5 times when permitted by her physician.  PT Education - 08/17/19 1143    Education Details  Lymphedema risk reduction and post op shoulder ROM HEP    Person(s) Educated  Patient    Methods  Explanation;Demonstration;Handout    Comprehension  Verbalized  understanding;Returned demonstration          PT Long Term Goals - 08/17/19 1151      PT LONG TERM GOAL #1   Title  Patient will demonstrate she has regained shoulder ROM and function post operatively compared to baselines.    Time  8    Period  Weeks    Status  New    Target Date  10/12/19      Breast Clinic Goals - 08/17/19 1151      Patient will be able to verbalize understanding of pertinent lymphedema risk reduction practices relevant to her diagnosis specifically related to skin care.   Time  1    Period  Days    Status  Achieved      Patient will be able to return demonstrate and/or verbalize understanding of the post-op home exercise program related to regaining shoulder range of motion.   Time  1    Period  Days    Status  Achieved      Patient will be able to verbalize understanding of the importance of attending the postoperative After Breast Cancer Class for further lymphedema risk reduction education and therapeutic exercise.   Time  1    Period  Days    Status  Achieved            Plan - 08/17/19 1145    Clinical Impression Statement  Patient was diagnosed on 08/02/2019 with left grade II invasive ductal carcinoma breast cancer. It measures 6 mm and is located in the upper outer quadrant. It is ER/PR positive and HER negative with a Ki67 of 2%. She had a previous right knee meniscal repair on 03/29/2019 and has been undergoing PT for that with little success. She had a cervical fusion at C1-C2 in March 2019. Her multidisciplinary medical team met prior to her assessments to determine a recommended treatment plan. She is planning to have a left lumpectomy and sentinel node biopsy followed by possible radiation and anti-estrogen therapy. She will benefit from post op PT reassessment to determine needs.    Stability/Clinical Decision Making  Stable/Uncomplicated    Clinical Decision Making  Low    Rehab Potential  Excellent    PT Frequency  --   Eval and 1 f/u  visit   PT Next Visit Plan  Will reassess 3-4 weeks post op to determine needs    PT Home Exercise Plan  Post op shoulder ROM HEP    Consulted and Agree with Plan of Care  Patient       Patient will benefit from skilled therapeutic intervention in order to improve the following deficits and impairments:  Postural dysfunction, Decreased range of motion, Pain, Impaired UE functional use, Decreased knowledge of precautions  Visit Diagnosis: Malignant neoplasm of upper-outer quadrant of left breast in female, estrogen receptor positive ([place]) - Plan: PT plan of care cert/re-cert  Abnormal posture - Plan: PT plan of care cert/re-cert   Patient will follow up at outpatient cancer rehab 3-4 weeks following surgery.  If the patient requires physical therapy at that time, a specific plan will be dictated and sent to the referring physician for approval. The patient was educated today on appropriate basic range of motion exercises to begin post  operatively and the importance of attending the After Breast Cancer class following surgery.  Patient was educated today on lymphedema risk reduction practices as it pertains to recommendations that will benefit the patient immediately following surgery.  She verbalized good understanding.      Problem List Patient Active Problem List   Diagnosis Date Noted  . Malignant neoplasm of upper-outer quadrant of left breast in female, estrogen receptor positive ([place]) 08/12/2019  . Osteoarthritis of occipito-atlanto-axial spinal region 01/11/2018  . Polyarthralgia 12/29/2016  . DJD (degenerative joint disease), cervical 12/29/2016  . Spondylosis of lumbar region without myelopathy or radiculopathy 12/29/2016  . Primary osteoarthritis of both hands 12/29/2016  . Essential hypertension 12/29/2016  . Restless leg syndrome 12/29/2016  . Allergic rhinitis due to allergen 12/29/2016  . History of vitamin D deficiency 12/29/2016  . History of basal cell carcinoma  12/29/2016  . Gastroesophageal reflux disease without esophagitis 12/29/2016  . History of anxiety 12/29/2016   Annia Friendly, PT 08/17/19 11:54 AM  [place], [place], [address] Phone: [phone]   Fax:  [phone]  Name: NIMCO BIVENS MRN: [id number] Date of Birth: 14-Jul-1946

## 2019-08-17 NOTE — Progress Notes (Signed)
Radiation Oncology         [phone] ________________________________  Initial outpatient Consultation  Name: Susan Randolph MRN: [id number]  Date: 08/17/2019  DOB: 17-Oct-1946  DS:KAJGO, Herbie [place], MD  Alphonsa Overall, MD   REFERRING PHYSICIAN: Alphonsa Overall, MD  DIAGNOSIS:    ICD-10-CM   1. Malignant neoplasm of upper-outer quadrant of left breast in female, estrogen receptor positive ([place])  C50.412    Z17.0    Cancer Staging Malignant neoplasm of upper-outer quadrant of left breast in female, estrogen receptor positive ([place]) Staging form: Breast, AJCC 8th Edition - Clinical stage from 08/17/2019: Stage IA (cT1b, cN0, cM0, G2, ER+, PR+, HER2-) - Unsigned   CHIEF COMPLAINT: Here to discuss management of left breast cancer  HISTORY OF PRESENT ILLNESS::Susan Randolph is a 73 y.o. female who presented with breast abnormality on the following imaging: routine screening mammogram on the date of 08/02/2019.  Symptoms, if any, at that time, were: none.   Ultrasound of breast on 08/08/2019 revealed 6 mm mass in the left breast at 12 o'clock.   Biopsy on date of 08/09/2019 showed invasive mammary carcinoma, e-cadherin positive.  ER status: positive; PR status positive, Her2 status negative by FISH; Grade 2.  She is in her USOH, here with her husband today.  PREVIOUS RADIATION THERAPY: No  PAST MEDICAL HISTORY:  has a past medical history of Anxiety, Arthritis, Cancer ([place]), GERD (gastroesophageal reflux disease), Headache, Hypertension, and Rectal bleed (09/2016).    PAST SURGICAL HISTORY: Past Surgical History:  Procedure Laterality Date   EYE SURGERY     Corrected drooping eye lids bilateral   MENISCUS REPAIR Right 03/29/2019   POSTERIOR CERVICAL FUSION/FORAMINOTOMY N/A 01/11/2018   Procedure: Posterior Cervical Fusion with lateral mass fixation - Cervical one-Cervical two with Iliac Crest bone graft;  Surgeon: Earnie Larsson, MD;  Location: [facility];  Service: Neurosurgery;  Laterality:  N/A;   TUBAL LIGATION      FAMILY HISTORY: family history includes Arthritis in her father; Breast cancer in her mother; Cancer in her father and mother; Colon cancer in her maternal uncle; Hypertension in her father and mother; Rheum arthritis in her maternal uncle.  SOCIAL HISTORY:  reports that she has never smoked. She has never used smokeless tobacco. She reports current alcohol use. She reports that she does not use drugs.  ALLERGIES: Sulfa antibiotics and Amitriptyline  MEDICATIONS:  Current Outpatient Medications  Medication Sig Dispense Refill   acetaminophen (TYLENOL) 500 MG tablet Take 500 mg by mouth 2 (two) times daily as needed for mild pain.      ALPRAZolam (XANAX) 0.5 MG tablet Take 0.25-0.5 mg by mouth 3 (three) times daily as needed for anxiety.     bimatoprost (LUMIGAN) 0.01 % SOLN Place 1 drop into both eyes at bedtime.      cetirizine (ZYRTEC) 10 MG tablet Take 10 mg by mouth daily as needed for allergies.     Cholecalciferol (VITAMIN D3) 2000 units capsule Take 2,000 Units by mouth daily.      cyclobenzaprine (FLEXERIL) 10 MG tablet Take 1 tablet (10 mg total) by mouth 3 (three) times daily as needed for muscle spasms. (Patient not taking: Reported on 08/17/2019) 30 tablet 0   fluticasone (FLONASE) 50 MCG/ACT nasal spray Place 2 sprays into both nostrils daily as needed for allergies or rhinitis.     gabapentin (NEURONTIN) 300 MG capsule Take 1 capsule by mouth at bedtime as needed for pain.  3   Guaifenesin (Gray)  1200 MG TB12 Take 2 tablets by mouth 2 (two) times daily as needed (cold symptoms).     lisinopril-hydrochlorothiazide (PRINZIDE,ZESTORETIC) 10-12.5 MG tablet Take 1 tablet by mouth daily.  9   Multiple Vitamins-Minerals (ALIVE WOMENS 50+) TABS Take 1 tablet by mouth daily.     oxyCODONE-acetaminophen (PERCOCET/ROXICET) 5-325 MG tablet Take 1-2 tablets by mouth every 4 (four) hours as needed for severe pain. 60 tablet 0    traZODone (DESYREL) 50 MG tablet Take 50 mg by mouth at bedtime.     No current facility-administered medications for this encounter.     REVIEW OF SYSTEMS: As above   PHYSICAL EXAM: BP 149/79, pulse 65, Resp 18, Temp 97.86F, 142lb  General: Alert and oriented, in no acute distress HEENT: Head is normocephalic Extremities: No cyanosis or edema. Lymphatics: Axillare clear Skin: No concerning lesions. Musculoskeletal: symmetric strength and muscle tone throughout. Neurologic: Cranial nerves II through XII are grossly intact. No obvious focalities. Speech is fluent. Coordination is intact. Psychiatric: Judgment and insight are intact. Affect is appropriate. Breasts: thickening at biopsy site, UOQ, left breast . No other palpable masses appreciated in the breasts or axillae bilaterally .   ECOG = 0  0 - Asymptomatic (Fully active, able to carry on all predisease activities without restriction)  1 - Symptomatic but completely ambulatory (Restricted in physically strenuous activity but ambulatory and able to carry out work of a light or sedentary nature. For example, light housework, office work)  2 - Symptomatic, <50% in bed during the day (Ambulatory and capable of all self care but unable to carry out any work activities. Up and about more than 50% of waking hours)  3 - Symptomatic, >50% in bed, but not bedbound (Capable of only limited self-care, confined to bed or chair 50% or more of waking hours)  4 - Bedbound (Completely disabled. Cannot carry on any self-care. Totally confined to bed or chair)  5 - Death   Eustace Pen MM, Creech RH, Tormey DC, et al. [id number]). "Toxicity and response criteria of the [facility]". [place] Oncol. 5 (6): 649-55   LABORATORY DATA:  Lab Results  Component Value Date   WBC 4.6 08/17/2019   HGB 12.9 08/17/2019   HCT 39.1 08/17/2019   MCV 90.1 08/17/2019   PLT 204 08/17/2019   CMP     Component Value Date/Time   NA 140  08/17/2019 0846   K 3.8 08/17/2019 0846   CL 103 08/17/2019 0846   CO2 26 08/17/2019 0846   GLUCOSE 99 08/17/2019 0846   BUN 22 08/17/2019 0846   CREATININE 0.83 08/17/2019 0846   CALCIUM 10.5 (H) 08/17/2019 0846   PROT 7.2 08/17/2019 0846   ALBUMIN 4.5 08/17/2019 0846   AST 16 08/17/2019 0846   ALT 17 08/17/2019 0846   ALKPHOS 64 08/17/2019 0846   BILITOT 0.4 08/17/2019 0846   GFRNONAA >60 08/17/2019 0846   GFRAA >60 08/17/2019 0846         RADIOGRAPHY: US Breast Ltd Uni Left Inc Axilla  Result Date: 08/08/2019 CLINICAL DATA:  Screening recall for possible left breast asymmetry. Patient's mother was diagnosed with breast carcinoma at approximately the patient's current age. EXAM: DIGITAL DIAGNOSTIC LEFT MAMMOGRAM WITH CAD AND TOMO ULTRASOUND LEFT BREAST COMPARISON:  Previous exam(s). ACR Breast Density Category b: There are scattered areas of fibroglandular density. FINDINGS: On the diagnostic spot-compression images the possible asymmetry persists. It appears as an ill-defined mass on both the CC and MLO spot  compression images, in the upper, posterior aspect of the left breast. Mammographic images were processed with CAD. On physical exam, no mass is palpated in the upper left breast. Targeted ultrasound is performed, showing a small hypoechoic mass with partly ill-defined margins as well as some angular and irregular margins in the posterior left breast at 12 o'clock, 3 cm the nipple, measuring 6 x 4 x 5 mm, consistent in size, shape and location to the mammographic finding. Sonographic evaluation of the left axilla shows no enlarged or abnormal lymph nodes. IMPRESSION: 1. 6 mm mass in the 12 o'clock position of the left breast, 3 cm the nipple, posterior depth, suspicious for a breast malignancy. RECOMMENDATION: 1. Ultrasound-guided core needle biopsy of the left breast mass. This procedure has been scheduled for 08/10/2019. I have discussed the findings and recommendations with the  patient. If applicable, a reminder letter will be sent to the patient regarding the next appointment. BI-RADS CATEGORY  4: Suspicious. Electronically Signed   By: Lajean Manes M.D.   On: 08/08/2019 15:02   Mm Diag Breast Tomo Uni Left  Result Date: 08/08/2019 CLINICAL DATA:  Screening recall for possible left breast asymmetry. Patient's mother was diagnosed with breast carcinoma at approximately the patient's current age. EXAM: DIGITAL DIAGNOSTIC LEFT MAMMOGRAM WITH CAD AND TOMO ULTRASOUND LEFT BREAST COMPARISON:  Previous exam(s). ACR Breast Density Category b: There are scattered areas of fibroglandular density. FINDINGS: On the diagnostic spot-compression images the possible asymmetry persists. It appears as an ill-defined mass on both the CC and MLO spot compression images, in the upper, posterior aspect of the left breast. Mammographic images were processed with CAD. On physical exam, no mass is palpated in the upper left breast. Targeted ultrasound is performed, showing a small hypoechoic mass with partly ill-defined margins as well as some angular and irregular margins in the posterior left breast at 12 o'clock, 3 cm the nipple, measuring 6 x 4 x 5 mm, consistent in size, shape and location to the mammographic finding. Sonographic evaluation of the left axilla shows no enlarged or abnormal lymph nodes. IMPRESSION: 1. 6 mm mass in the 12 o'clock position of the left breast, 3 cm the nipple, posterior depth, suspicious for a breast malignancy. RECOMMENDATION: 1. Ultrasound-guided core needle biopsy of the left breast mass. This procedure has been scheduled for 08/10/2019. I have discussed the findings and recommendations with the patient. If applicable, a reminder letter will be sent to the patient regarding the next appointment. BI-RADS CATEGORY  4: Suspicious. Electronically Signed   By: Lajean Manes M.D.   On: 08/08/2019 15:02   Mm 3d Screen Breast Bilateral  Result Date: 08/03/2019 CLINICAL  DATA:  Screening. EXAM: DIGITAL SCREENING BILATERAL MAMMOGRAM WITH TOMO AND CAD COMPARISON:  Previous exam(s). ACR Breast Density Category b: There are scattered areas of fibroglandular density. FINDINGS: In the left breast, a possible asymmetry warrants further evaluation. In the right breast, no findings suspicious for malignancy. Images were processed with CAD. IMPRESSION: Further evaluation is suggested for possible asymmetry in the left breast. RECOMMENDATION: Diagnostic mammogram and possibly ultrasound of the left breast. (Code:[id number]) The patient will be contacted regarding the findings, and additional imaging will be scheduled. BI-RADS CATEGORY  0: Incomplete. Need additional imaging evaluation and/or prior mammograms for comparison. Electronically Signed   By: Kristopher Oppenheim M.D.   On: 08/03/2019 12:49   Mm Clip Placement Left  Addendum Date: 08/11/2019   ADDENDUM REPORT: 08/10/2019 14:46 ADDENDUM: Pathology revealed GRADE II INVASIVE MAMMARY CARCINOMA  of the LEFT breast, upper, 12 o'clock position. This was found to be concordant by Dr. Hassan Rowan. Pathology results were discussed with the patient by telephone. The patient reported doing well after the biopsy with tenderness at the site. Post biopsy instructions and care were reviewed and questions were answered. The patient was encouraged to call [facility] for any additional concerns. The patient was referred to [facility] on August 17, 2019. Pathology results reported by Stacie Acres, RN on 08/10/2019. Electronically Signed   By: Margarette Canada M.D.   On: 08/10/2019 14:46   Result Date: 08/11/2019 CLINICAL DATA:  73 year old female for tissue sampling of 0.6 cm UPPER LEFT breast mass. EXAM: ULTRASOUND GUIDED LEFT BREAST CORE NEEDLE BIOPSY 3D LEFT MAMMOGRAM POST ULTRASOUND BIOPSY COMPARISON:  Previous exam(s). FINDINGS: I met with the patient  and we discussed the procedure of ultrasound-guided biopsy, including benefits and alternatives. We discussed the high likelihood of a successful procedure. We discussed the risks of the procedure, including infection, bleeding, tissue injury, clip migration, and inadequate sampling. Informed written consent was given. The usual time-out protocol was performed immediately prior to the procedure. Using sterile technique and 1% Lidocaine as local anesthetic, under direct ultrasound visualization, a 12 gauge spring-loaded device was used to perform biopsy of the 0.6 cm mass at the 12 o'clock position of the LEFT breast 3 cm from the nipple using a MEDIAL approach. At the conclusion of the procedure a RIBBON tissue marker clip was deployed into the biopsy cavity. 3D Full field views of the LEFT breast demonstrate satisfactory position of the RIBBON clip within the UPPER LEFT breast at the site of the biopsied mass. The mass biopsied sonographically corresponds to the mass identified mammographically. IMPRESSION: Ultrasound guided biopsy of 0.6 cm UPPER LEFT breast mass. No apparent complications. Satisfactory position of RIBBON clip following ultrasound-guided LEFT breast biopsy. Electronically Signed: By: Margarette Canada M.D. On: 08/09/2019 12:01   [place] Lt Breast Bx W Loc Dev 1st Lesion Img Bx Spec US Guide  Addendum Date: 08/11/2019   ADDENDUM REPORT: 08/10/2019 14:46 ADDENDUM: Pathology revealed GRADE II INVASIVE MAMMARY CARCINOMA of the LEFT breast, upper, 12 o'clock position. This was found to be concordant by Dr. Hassan Rowan. Pathology results were discussed with the patient by telephone. The patient reported doing well after the biopsy with tenderness at the site. Post biopsy instructions and care were reviewed and questions were answered. The patient was encouraged to call [facility] for any additional concerns. The patient was referred to [facility]  at [facility] on August 17, 2019. Pathology results reported by Stacie Acres, RN on 08/10/2019. Electronically Signed   By: Margarette Canada M.D.   On: 08/10/2019 14:46   Result Date: 08/11/2019 CLINICAL DATA:  72 year old female for tissue sampling of 0.6 cm UPPER LEFT breast mass. EXAM: ULTRASOUND GUIDED LEFT BREAST CORE NEEDLE BIOPSY 3D LEFT MAMMOGRAM POST ULTRASOUND BIOPSY COMPARISON:  Previous exam(s). FINDINGS: I met with the patient and we discussed the procedure of ultrasound-guided biopsy, including benefits and alternatives. We discussed the high likelihood of a successful procedure. We discussed the risks of the procedure, including infection, bleeding, tissue injury, clip migration, and inadequate sampling. Informed written consent was given. The usual time-out protocol was performed immediately prior to the procedure. Using sterile technique and 1% Lidocaine as local anesthetic, under direct ultrasound  visualization, a 12 gauge spring-loaded device was used to perform biopsy of the 0.6 cm mass at the 12 o'clock position of the LEFT breast 3 cm from the nipple using a MEDIAL approach. At the conclusion of the procedure a RIBBON tissue marker clip was deployed into the biopsy cavity. 3D Full field views of the LEFT breast demonstrate satisfactory position of the RIBBON clip within the UPPER LEFT breast at the site of the biopsied mass. The mass biopsied sonographically corresponds to the mass identified mammographically. IMPRESSION: Ultrasound guided biopsy of 0.6 cm UPPER LEFT breast mass. No apparent complications. Satisfactory position of RIBBON clip following ultrasound-guided LEFT breast biopsy. Electronically Signed: By: Margarette Canada M.D. On: 08/09/2019 12:01      IMPRESSION/PLAN: Left Breast Cancer   It was a pleasure meeting the patient today. We discussed the risks, benefits, and side effects of radiotherapy. Although RT is optional for Stage I ER+ breast cancer in  patients 8yo and older, and although it would not improve her life expectancy, she is interested in taking aggressive measures for local control. Therefore, I recommend radiotherapy to the left breast to reduce her risk of locoregional recurrence by 2/3.  We discussed that radiation would take approximately 4 weeks to complete and that I would give the patient a few weeks to heal following surgery before starting treatment planning.  If chemotherapy were to be given, this would precede radiotherapy. We spoke about acute effects including skin irritation and fatigue as well as much less common late effects including internal organ injury or irritation. We spoke about the latest technology that is used to minimize the risk of late effects for patients undergoing radiotherapy to the breast or chest wall. No guarantees of treatment were given. The patient is enthusiastic about proceeding with treatment. I look forward to participating in the patient's care.  I will await her referral back to me for postoperative follow-up and eventual CT simulation/treatment planning.     Eppie Gibson, MD   This document serves as a record of services personally performed by Eppie Gibson, MD. It was created on her behalf by Wilburn Mylar, a trained medical scribe. The creation of this record is based on the scribe's personal observations and the provider's statements to them. This document has been checked and approved by the attending provider.

## 2019-08-18 ENCOUNTER — Encounter: Payer: Self-pay | Admitting: Radiation Oncology

## 2019-08-21 HISTORY — PX: BREAST EXCISIONAL BIOPSY: SUR124

## 2019-08-21 HISTORY — PX: BREAST BIOPSY: SHX20

## 2019-08-23 ENCOUNTER — Other Ambulatory Visit (HOSPITAL_COMMUNITY): Payer: Self-pay | Admitting: Surgery

## 2019-08-23 DIAGNOSIS — Z170 Estrogen receptor positive status [ER+]: Secondary | ICD-10-CM

## 2019-08-23 DIAGNOSIS — C50912 Malignant neoplasm of unspecified site of left female breast: Secondary | ICD-10-CM

## 2019-08-24 ENCOUNTER — Encounter (HOSPITAL_BASED_OUTPATIENT_CLINIC_OR_DEPARTMENT_OTHER): Payer: Self-pay | Admitting: *Deleted

## 2019-08-24 ENCOUNTER — Other Ambulatory Visit: Payer: Self-pay

## 2019-08-25 ENCOUNTER — Encounter (HOSPITAL_BASED_OUTPATIENT_CLINIC_OR_DEPARTMENT_OTHER)
Admission: RE | Admit: 2019-08-25 | Discharge: 2019-08-25 | Disposition: A | Discharge status: Home / Self Care | Payer: Medicare Other | Source: Ambulatory Visit | Attending: Surgery | Admitting: Surgery

## 2019-08-25 ENCOUNTER — Telehealth: Payer: Self-pay | Admitting: *Deleted

## 2019-08-25 ENCOUNTER — Ambulatory Visit
Admission: RE | Admit: 2019-08-25 | Discharge: 2019-08-25 | Disposition: A | Discharge status: Home / Self Care | Payer: Medicare Other | Source: Ambulatory Visit | Attending: Obstetrics and Gynecology | Admitting: Obstetrics and Gynecology

## 2019-08-25 DIAGNOSIS — Z170 Estrogen receptor positive status [ER+]: Secondary | ICD-10-CM

## 2019-08-25 DIAGNOSIS — M8580 Other specified disorders of bone density and structure, unspecified site: Secondary | ICD-10-CM

## 2019-08-25 DIAGNOSIS — Z01810 Encounter for preprocedural cardiovascular examination: Secondary | ICD-10-CM | POA: Insufficient documentation

## 2019-08-25 DIAGNOSIS — C50412 Malignant neoplasm of upper-outer quadrant of left female breast: Secondary | ICD-10-CM

## 2019-08-25 NOTE — Telephone Encounter (Signed)
Spoke with patient to follow up from [facility] to assess navigation needs.  No needs at this time.  Will continue to follow.

## 2019-08-25 NOTE — Progress Notes (Signed)

## 2019-08-27 ENCOUNTER — Other Ambulatory Visit (HOSPITAL_COMMUNITY)
Admission: RE | Admit: 2019-08-27 | Discharge: 2019-08-27 | Disposition: A | Discharge status: Home / Self Care | Payer: Medicare Other | Source: Ambulatory Visit | Attending: Surgery | Admitting: Surgery

## 2019-08-27 DIAGNOSIS — Z20828 Contact with and (suspected) exposure to other viral communicable diseases: Secondary | ICD-10-CM | POA: Diagnosis not present

## 2019-08-27 DIAGNOSIS — Z01812 Encounter for preprocedural laboratory examination: Secondary | ICD-10-CM | POA: Diagnosis present

## 2019-08-28 LAB — NOVEL CORONAVIRUS, NAA (HOSP ORDER, SEND-OUT TO REF LAB; TAT 18-24 HRS): SARS-CoV-2, NAA: NOT DETECTED

## 2019-08-30 ENCOUNTER — Other Ambulatory Visit: Payer: Self-pay

## 2019-08-30 ENCOUNTER — Ambulatory Visit
Admission: RE | Admit: 2019-08-30 | Discharge: 2019-08-30 | Disposition: A | Discharge status: Home / Self Care | Payer: Medicare Other | Source: Ambulatory Visit | Attending: Surgery | Admitting: Surgery

## 2019-08-30 ENCOUNTER — Other Ambulatory Visit: Payer: Medicare Other

## 2019-08-30 DIAGNOSIS — C50912 Malignant neoplasm of unspecified site of left female breast: Secondary | ICD-10-CM

## 2019-08-30 NOTE — H&P (Signed)
[place]  Location: [facility] Patient #: [id number] DOB: August 06, 1946 Undefined / Language: Cleophus Molt / Race: White Female  History of Present Illness   The patient is a 73 year old female who presents with a complaint of breast cancer.  The PCP is Dr. Brayton Layman  The patient was referred by Dr. Micheline Chapman  The patient is at the Breast [facility] - Oncology is Drs. Magrinat and Isidore Moos Her husband came at the end of the interview.  [The Covid-19 virus has disrupted normal medical care in [place] and across the nation. We have sometimes had to alter normal surgical/medical care to limit this epidemic and we have explained these changes to the patient.]  She has no prior history of breast trouble. She may have been on hormone replacement therapy, but if so, she took it only shortly.  Mammograms: [facility] - 08/08/2019 - 6 mm mass at the 12 o'clock position of the left breast Biopsy: 08/09/2019 - left breast biopsy at 12 o'clock - [id number] - ER - 100%, PR - 95%, Ki67 - 2%, Her2Neu - negative Family history of breast or ovarian cancer: Mother with breast and endometrial cancer On hormone therapy: Maybe, for a while, but not in at least 5 years  I discussed the options for breast cancer treatment with the patient. The patient is at the [facility], which includes medical oncology and radiation oncology. I discussed the surgical options of lumpectomy vs. mastectomy. If mastectomy, there is the possibility of reconstruction. I discussed the options of lymph node biopsy. The treatment plan depends on the pathologic staging of the tumor and the patient's personal wishes. The risks of surgery include, but are not limited to, bleeding, infection, the need for further surgery, and nerve injury. The patient has been given literature on the treatment of breast cancer.  Plan: 1) left breast lumpectomy (seed  localization) and left axillary SLNBx, 2) probably not radiation therapy, 3) Antihormone therapy  Past Medical History: 1. HTN x 10 years 2. Colonoscopy - 2017 - [facility] (? physician) 3. Right knee arthroscopy - June 2020 - Alusio her right knee still bothers her - she said that she had more arthritis than she thought 4. 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 Knee Surgery  Right. Oral Surgery  Sentinel Lymph Node Biopsy  Spinal Surgery - Neck   Diagnostic Studies History Tawni Pummel, RN; 08/17/2019 7:19 AM) Colonoscopy  1-5 years ago Mammogram  within last year Pap Smear  1-5 years ago  Medication History Tawni Pummel, RN; 08/17/2019 7:19 AM) Medications Reconciled  Social History Tawni Pummel, RN; 08/17/2019 7:19 AM) Alcohol use  Occasional alcohol use. Caffeine use  Coffee. No drug use  Tobacco use  Never smoker.  Family History Tawni Pummel, RN; 08/17/2019 7:19 AM) Arthritis  Family Members In General, Father, Mother. Breast Cancer  Mother. Cancer  Father, Mother. Cerebrovascular Accident  Mother. Colon Cancer  Family Members In General. Colon Polyps  Mother. Hypertension  Father, Mother. Malignant Neoplasm Of Pancreas  Family Members In General. Seizure disorder  Father. Thyroid problems  Daughter.  Pregnancy / Birth History Tawni Pummel, RN; 08/17/2019 7:19 AM) Age at menarche  69 years. Age of menopause  1-50 Contraceptive History  Oral contraceptives. Gravida  3 Length (months) of breastfeeding   3-6 Maternal age  71-20 Para  3 Regular periods   Other Problems Tawni Pummel, RN; 08/17/2019 7:19 AM) Anxiety Disorder  Arthritis  Back Pain  Depression  Gastroesophageal Reflux Disease  High blood pressure  Melanoma     Review of Systems Sunday Spillers Ledford RN; 08/17/2019 7:19 AM) General Present- Weight Loss. Not Present- Appetite Loss, Chills, Fatigue, Fever, Night Sweats and Weight Gain. Skin Present- Dryness. Not Present- Change in Wart/Mole, Hives, Jaundice, New Lesions, Non-Healing Wounds, Rash and Ulcer. HEENT Present- Seasonal Allergies and Wears glasses/contact lenses. Not Present- Earache, Hearing Loss, Hoarseness, Nose Bleed, Oral Ulcers, Ringing in the Ears, Sinus Pain, Sore Throat, Visual Disturbances and Yellow Eyes. Respiratory Not Present- Bloody sputum, Chronic Cough, Difficulty Breathing, Snoring and Wheezing. Breast Present- Breast Mass. Not Present- Breast Pain, Nipple Discharge and Skin Changes. Cardiovascular Present- Leg Cramps. Not Present- Chest Pain, Difficulty Breathing Lying Down, Palpitations, Rapid Heart Rate, Shortness of Breath and Swelling of Extremities. Gastrointestinal Present- Constipation, Gets full quickly at meals and Indigestion. Not Present- Abdominal Pain, Bloating, Bloody Stool, Change in Bowel Habits, Chronic diarrhea, Difficulty Swallowing, Excessive gas, Hemorrhoids, Nausea, Rectal Pain and Vomiting. Female Genitourinary Present- Frequency, Nocturia and Urgency. Not Present- Painful Urination and Pelvic Pain. Musculoskeletal Present- Back Pain, Joint Pain, Joint Stiffness, Muscle Pain and Muscle Weakness. Not Present- Swelling of Extremities. Neurological Present- Trouble walking and Weakness. Not Present- Decreased Memory, Fainting, Headaches, Numbness, Seizures, Tingling and Tremor. Psychiatric Present- Anxiety and Change in Sleep Pattern. Not Present- Bipolar, Depression, Fearful and Frequent crying. Endocrine Not Present- Cold  Intolerance, Excessive Hunger, Hair Changes, Heat Intolerance, Hot flashes and New Diabetes. Hematology Not Present- Blood Thinners, Easy Bruising, Excessive bleeding, Gland problems, HIV and Persistent Infections.   Physical Exam  General: WN older WF who is alert and generally healthy appearing. She is wearing a mask. HEENT: Normal. Pupils equal.  Neck: Supple. No mass. No thyroid mass. Lymph Nodes: No supraclavicular or cervical nodes. No axillary nodes.  Lungs: Clear to auscultation and symmetric breath sounds. Heart: RRR. No murmur or rub.  Breasts: Right - small to medium sized  Left - small to medium sized, bruise at 12 o'clock, I could not feel a mass  Abdomen: Soft. No mass. No tenderness. No hernia. Normal bowel sounds. No abdominal scars. Rectal: Not done.  Extremities: Good strength and ROM in upper and lower extremities.  Neurologic: Grossly intact to motor and sensory function. Psychiatric: Has normal mood and affect. Behavior is normal.  Assessment & Plan  1.  MALIGNANT NEOPLASM OF LEFT BREAST, STAGE 1, ESTROGEN RECEPTOR POSITIVE (P80.998)  Story: 08/09/2019 - left breast biopsy at 12 o'clock - [id number] - ER - 100%, PR - 95%, Ki67 - 2%, Her2Neu - negative  Oncology - Magrinat and [facility]  Plan:  1) left breast lumpectomy (seed localization) and left axillary SLNBx   2) probably not radiation therapy  3) Antihormone therapy  2. HTN x 10 years 3. Right knee arthroscopy - June 2020 - Alusio her right knee still bothers her - she said that she had more arthritis than she thought 4. Fusion of C1-C2 - March 2019 - London Sheer, MD, [facility] Office phone:  [phone]

## 2019-08-31 ENCOUNTER — Ambulatory Visit (HOSPITAL_COMMUNITY)
Admission: RE | Admit: 2019-08-31 | Discharge: 2019-08-31 | Disposition: A | Discharge status: Home / Self Care | Payer: Medicare Other | Source: Ambulatory Visit | Attending: Surgery | Admitting: Surgery

## 2019-08-31 ENCOUNTER — Ambulatory Visit (HOSPITAL_BASED_OUTPATIENT_CLINIC_OR_DEPARTMENT_OTHER): Payer: Medicare Other | Admitting: Anesthesiology

## 2019-08-31 ENCOUNTER — Encounter (HOSPITAL_BASED_OUTPATIENT_CLINIC_OR_DEPARTMENT_OTHER)
Admission: RE | Disposition: A | Discharge status: Home / Self Care | Payer: Self-pay | Source: Home / Self Care | Attending: Surgery

## 2019-08-31 ENCOUNTER — Ambulatory Visit (HOSPITAL_BASED_OUTPATIENT_CLINIC_OR_DEPARTMENT_OTHER)
Admission: RE | Admit: 2019-08-31 | Discharge: 2019-08-31 | Disposition: A | Discharge status: Home / Self Care | Payer: Medicare Other | Attending: Surgery | Admitting: Surgery

## 2019-08-31 ENCOUNTER — Encounter (HOSPITAL_BASED_OUTPATIENT_CLINIC_OR_DEPARTMENT_OTHER): Payer: Self-pay | Admitting: Anesthesiology

## 2019-08-31 ENCOUNTER — Ambulatory Visit
Admission: RE | Admit: 2019-08-31 | Discharge: 2019-08-31 | Disposition: A | Discharge status: Home / Self Care | Payer: Medicare Other | Source: Ambulatory Visit | Attending: Surgery | Admitting: Surgery

## 2019-08-31 ENCOUNTER — Other Ambulatory Visit: Payer: Self-pay

## 2019-08-31 DIAGNOSIS — I10 Essential (primary) hypertension: Secondary | ICD-10-CM | POA: Insufficient documentation

## 2019-08-31 DIAGNOSIS — Z8249 Family history of ischemic heart disease and other diseases of the circulatory system: Secondary | ICD-10-CM | POA: Diagnosis not present

## 2019-08-31 DIAGNOSIS — C50912 Malignant neoplasm of unspecified site of left female breast: Secondary | ICD-10-CM

## 2019-08-31 DIAGNOSIS — Z803 Family history of malignant neoplasm of breast: Secondary | ICD-10-CM | POA: Diagnosis not present

## 2019-08-31 DIAGNOSIS — M1711 Unilateral primary osteoarthritis, right knee: Secondary | ICD-10-CM | POA: Diagnosis not present

## 2019-08-31 DIAGNOSIS — Z170 Estrogen receptor positive status [ER+]: Secondary | ICD-10-CM | POA: Insufficient documentation

## 2019-08-31 DIAGNOSIS — C50412 Malignant neoplasm of upper-outer quadrant of left female breast: Secondary | ICD-10-CM | POA: Insufficient documentation

## 2019-08-31 HISTORY — PX: BREAST LUMPECTOMY WITH RADIOACTIVE SEED AND SENTINEL LYMPH NODE BIOPSY: SHX6550

## 2019-08-31 SURGERY — BREAST LUMPECTOMY WITH RADIOACTIVE SEED AND SENTINEL LYMPH NODE BIOPSY
Anesthesia: General | Site: Breast | Laterality: Left

## 2019-08-31 MED ORDER — CHLORHEXIDINE GLUCONATE CLOTH 2 % EX PADS: 6.0000 | MEDICATED_PAD | Freq: Once | CUTANEOUS | Status: DC

## 2019-08-31 MED ORDER — TECHNETIUM TC 99M SULFUR COLLOID FILTERED
1.0000 | Freq: Once | INTRAVENOUS | Status: AC | PRN
  Administered 2019-08-31: 1 via INTRADERMAL

## 2019-08-31 MED ORDER — MIDAZOLAM HCL 2 MG/2ML IJ SOLN
INTRAMUSCULAR | Status: AC
  Filled 2019-08-31: qty 2

## 2019-08-31 MED ORDER — KETOROLAC TROMETHAMINE 15 MG/ML IJ SOLN: 15.0000 mg | Freq: Once | INTRAMUSCULAR | Status: DC

## 2019-08-31 MED ORDER — METHYLENE BLUE 0.5 % INJ SOLN
INTRAVENOUS | Status: AC
  Filled 2019-08-31: qty 10

## 2019-08-31 MED ORDER — BUPIVACAINE HCL (PF) 0.25 % IJ SOLN
INTRAMUSCULAR | Status: AC
  Filled 2019-08-31: qty 30

## 2019-08-31 MED ORDER — FENTANYL CITRATE (PF) 100 MCG/2ML IJ SOLN
50.0000 ug | INTRAMUSCULAR | Status: AC | PRN
  Administered 2019-08-31: 25 ug via INTRAVENOUS
  Administered 2019-08-31: 100 ug via INTRAVENOUS
  Administered 2019-08-31: 25 ug via INTRAVENOUS

## 2019-08-31 MED ORDER — PROPOFOL 10 MG/ML IV BOLUS
INTRAVENOUS | Status: DC | PRN
  Administered 2019-08-31: 130 mg via INTRAVENOUS

## 2019-08-31 MED ORDER — FENTANYL CITRATE (PF) 100 MCG/2ML IJ SOLN
INTRAMUSCULAR | Status: AC
  Filled 2019-08-31: qty 2

## 2019-08-31 MED ORDER — CLONIDINE HCL (ANALGESIA) 100 MCG/ML EP SOLN
EPIDURAL | Status: DC | PRN
  Administered 2019-08-31: 100 ug

## 2019-08-31 MED ORDER — MEPERIDINE HCL 25 MG/ML IJ SOLN: 6.2500 mg | INTRAMUSCULAR | Status: DC | PRN

## 2019-08-31 MED ORDER — OXYCODONE HCL 5 MG/5ML PO SOLN: 5.0000 mg | Freq: Once | ORAL | Status: AC | PRN

## 2019-08-31 MED ORDER — BUPIVACAINE HCL (PF) 0.25 % IJ SOLN
INTRAMUSCULAR | Status: DC | PRN
  Administered 2019-08-31: 20 mL

## 2019-08-31 MED ORDER — CEFAZOLIN SODIUM-DEXTROSE 2-4 GM/100ML-% IV SOLN
INTRAVENOUS | Status: AC
  Filled 2019-08-31: qty 100

## 2019-08-31 MED ORDER — DEXAMETHASONE SODIUM PHOSPHATE 4 MG/ML IJ SOLN
INTRAMUSCULAR | Status: DC | PRN
  Administered 2019-08-31: 5 mg via INTRAVENOUS

## 2019-08-31 MED ORDER — BUPIVACAINE-EPINEPHRINE (PF) 0.5% -1:200000 IJ SOLN
INTRAMUSCULAR | Status: DC | PRN
  Administered 2019-08-31 (×6): 5 mL via PERINEURAL

## 2019-08-31 MED ORDER — SODIUM CHLORIDE (PF) 0.9 % IJ SOLN
INTRAMUSCULAR | Status: AC
  Filled 2019-08-31: qty 10

## 2019-08-31 MED ORDER — PROPOFOL 10 MG/ML IV BOLUS
INTRAVENOUS | Status: AC
  Filled 2019-08-31: qty 20

## 2019-08-31 MED ORDER — EPHEDRINE SULFATE 50 MG/ML IJ SOLN
INTRAMUSCULAR | Status: DC | PRN
  Administered 2019-08-31 (×2): 10 mg via INTRAVENOUS

## 2019-08-31 MED ORDER — LIDOCAINE 2% (20 MG/ML) 5 ML SYRINGE
INTRAMUSCULAR | Status: AC
  Filled 2019-08-31: qty 5

## 2019-08-31 MED ORDER — ACETAMINOPHEN 160 MG/5ML PO SOLN: 325.0000 mg | ORAL | Status: DC | PRN

## 2019-08-31 MED ORDER — ACETAMINOPHEN 500 MG PO TABS
ORAL_TABLET | ORAL | Status: AC
  Filled 2019-08-31: qty 2

## 2019-08-31 MED ORDER — ACETAMINOPHEN 500 MG PO TABS
1000.0000 mg | ORAL_TABLET | ORAL | Status: AC
  Administered 2019-08-31: 1000 mg via ORAL

## 2019-08-31 MED ORDER — CEFAZOLIN SODIUM-DEXTROSE 2-4 GM/100ML-% IV SOLN
2.0000 g | INTRAVENOUS | Status: AC
  Administered 2019-08-31: 2 g via INTRAVENOUS

## 2019-08-31 MED ORDER — LACTATED RINGERS IV SOLN
INTRAVENOUS | Status: DC
  Administered 2019-08-31 (×2): via INTRAVENOUS

## 2019-08-31 MED ORDER — BUPIVACAINE HCL (PF) 0.5 % IJ SOLN
INTRAMUSCULAR | Status: AC
  Filled 2019-08-31: qty 30

## 2019-08-31 MED ORDER — OXYCODONE HCL 5 MG PO TABS
ORAL_TABLET | ORAL | Status: AC
  Filled 2019-08-31: qty 1

## 2019-08-31 MED ORDER — FENTANYL CITRATE (PF) 100 MCG/2ML IJ SOLN: 25.0000 ug | INTRAMUSCULAR | Status: DC | PRN

## 2019-08-31 MED ORDER — HYDROCODONE-ACETAMINOPHEN 5-325 MG PO TABS: 1.0000 | ORAL_TABLET | Freq: Four times a day (QID) | ORAL | 0 refills | Status: DC | PRN

## 2019-08-31 MED ORDER — MIDAZOLAM HCL 2 MG/2ML IJ SOLN
1.0000 mg | INTRAMUSCULAR | Status: DC | PRN
  Administered 2019-08-31: 2 mg via INTRAVENOUS

## 2019-08-31 MED ORDER — ACETAMINOPHEN 325 MG PO TABS: 325.0000 mg | ORAL_TABLET | ORAL | Status: DC | PRN

## 2019-08-31 MED ORDER — OXYCODONE HCL 5 MG PO TABS
5.0000 mg | ORAL_TABLET | Freq: Once | ORAL | Status: AC | PRN
  Administered 2019-08-31: 5 mg via ORAL

## 2019-08-31 MED ORDER — LIDOCAINE HCL (CARDIAC) PF 100 MG/5ML IV SOSY
PREFILLED_SYRINGE | INTRAVENOUS | Status: DC | PRN
  Administered 2019-08-31: 100 mg via INTRAVENOUS

## 2019-08-31 SURGICAL SUPPLY — 59 items
ADH SKN CLS APL DERMABOND .7 (GAUZE/BANDAGES/DRESSINGS) ×1
APL PRP STRL LF DISP 70% ISPRP (MISCELLANEOUS) ×1
APL SKNCLS STERI-STRIP NONHPOA (GAUZE/BANDAGES/DRESSINGS)
BENZOIN TINCTURE PRP APPL 2/3 (GAUZE/BANDAGES/DRESSINGS) IMPLANT
BINDER BREAST LRG (GAUZE/BANDAGES/DRESSINGS) ×2 IMPLANT
BINDER BREAST MEDIUM (GAUZE/BANDAGES/DRESSINGS) IMPLANT
BINDER BREAST XLRG (GAUZE/BANDAGES/DRESSINGS) IMPLANT
BINDER BREAST XXLRG (GAUZE/BANDAGES/DRESSINGS) IMPLANT
BLADE SURG 15 STRL LF DISP TIS (BLADE) ×1 IMPLANT
BLADE SURG 15 STRL SS (BLADE) ×3
CANISTER SUC SOCK COL 7IN (MISCELLANEOUS) IMPLANT
CANISTER SUCT 1200ML W/VALVE (MISCELLANEOUS) ×3 IMPLANT
CHLORAPREP W/TINT 26 (MISCELLANEOUS) ×3 IMPLANT
CLIP VESOCCLUDE SM WIDE 6/CT (CLIP) ×3 IMPLANT
CLOSURE WOUND 1/2 X4 (GAUZE/BANDAGES/DRESSINGS)
COVER BACK TABLE REUSABLE LG (DRAPES) ×3 IMPLANT
COVER MAYO STAND REUSABLE (DRAPES) ×3 IMPLANT
COVER PROBE W GEL 5X96 (DRAPES) ×3 IMPLANT
COVER WAND RF STERILE (DRAPES) IMPLANT
DECANTER SPIKE VIAL GLASS SM (MISCELLANEOUS) IMPLANT
DERMABOND ADVANCED (GAUZE/BANDAGES/DRESSINGS) ×2
DERMABOND ADVANCED .7 DNX12 (GAUZE/BANDAGES/DRESSINGS) ×1 IMPLANT
DRAPE HALF SHEET 70X43 (DRAPES) ×3 IMPLANT
DRAPE LAPAROSCOPIC ABDOMINAL (DRAPES) ×3 IMPLANT
DRAPE UTILITY XL STRL (DRAPES) ×3 IMPLANT
DRSG PAD ABDOMINAL 8X10 ST (GAUZE/BANDAGES/DRESSINGS) ×2 IMPLANT
ELECT COATED BLADE 2.86 ST (ELECTRODE) ×3 IMPLANT
ELECT REM PT RETURN 9FT ADLT (ELECTROSURGICAL) ×3
ELECTRODE REM PT RTRN 9FT ADLT (ELECTROSURGICAL) ×1 IMPLANT
GAUZE SPONGE 4X4 12PLY STRL (GAUZE/BANDAGES/DRESSINGS) ×3 IMPLANT
GLOVE BIO SURGEON STRL SZ 6.5 (GLOVE) ×1 IMPLANT
GLOVE BIO SURGEONS STRL SZ 6.5 (GLOVE) ×1
GLOVE EXAM NITRILE MD LF STRL (GLOVE) ×2 IMPLANT
GLOVE SURG SYN 7.5  E (GLOVE) ×4
GLOVE SURG SYN 7.5 E (GLOVE) ×2 IMPLANT
GLOVE SURG SYN 7.5 PF PI (GLOVE) ×2 IMPLANT
GOWN STRL REUS W/ TWL LRG LVL3 (GOWN DISPOSABLE) ×1 IMPLANT
GOWN STRL REUS W/ TWL XL LVL3 (GOWN DISPOSABLE) ×1 IMPLANT
GOWN STRL REUS W/TWL LRG LVL3 (GOWN DISPOSABLE) ×3
GOWN STRL REUS W/TWL XL LVL3 (GOWN DISPOSABLE) ×3
KIT MARKER MARGIN INK (KITS) ×3 IMPLANT
NDL HYPO 25X1 1.5 SAFETY (NEEDLE) ×1 IMPLANT
NDL SAFETY ECLIPSE 18X1.5 (NEEDLE) IMPLANT
NEEDLE HYPO 18GX1.5 SHARP (NEEDLE)
NEEDLE HYPO 25X1 1.5 SAFETY (NEEDLE) ×3 IMPLANT
NS IRRIG 1000ML POUR BTL (IV SOLUTION) ×3 IMPLANT
PACK BASIN DAY SURGERY FS (CUSTOM PROCEDURE TRAY) ×3 IMPLANT
PENCIL SMOKE EVACUATOR (MISCELLANEOUS) ×3 IMPLANT
SLEEVE SCD COMPRESS KNEE MED (MISCELLANEOUS) ×3 IMPLANT
SPONGE LAP 18X18 RF (DISPOSABLE) ×3 IMPLANT
STRIP CLOSURE SKIN 1/2X4 (GAUZE/BANDAGES/DRESSINGS) IMPLANT
SUT MNCRL AB 4-0 PS2 18 (SUTURE) ×5 IMPLANT
SUT VICRYL 3-0 CR8 SH (SUTURE) ×5 IMPLANT
SYR CONTROL 10ML LL (SYRINGE) ×3 IMPLANT
TOWEL GREEN STERILE FF (TOWEL DISPOSABLE) ×3 IMPLANT
TRAY FAXITRON CT DISP (TRAY / TRAY PROCEDURE) ×3 IMPLANT
TUBE CONNECTING 20'X1/4 (TUBING) ×1
TUBE CONNECTING 20X1/4 (TUBING) ×2 IMPLANT
YANKAUER SUCT BULB TIP NO VENT (SUCTIONS) ×3 IMPLANT

## 2019-08-31 NOTE — Anesthesia Postprocedure Evaluation (Signed)
Anesthesia Post Note  Patient: Susan Randolph  Procedure(s) Performed: LEFT BREAST LUMPECTOMY WITH RADIOACTIVE SEED AND LEFT AXILLARY SENTINEL LYMPH NODE BIOPSY (Left Breast)     Patient location during evaluation: PACU Anesthesia Type: General Level of consciousness: sedated and awake Pain management: pain level controlled Vital Signs Assessment: post-procedure vital signs reviewed and stable Respiratory status: spontaneous breathing Cardiovascular status: stable Postop Assessment: no apparent nausea or vomiting Anesthetic complications: no    Last Vitals:  Vitals:   08/31/19 1445 08/31/19 1500  BP: 126/60 137/65  Pulse: 71 74  Resp: 12 13  Temp:    SpO2: 100% 100%    Last Pain:  Vitals:   08/31/19 1500  TempSrc:   PainSc: 0-No pain   Pain Goal: Patients Stated Pain Goal: 4 (08/31/19 1129)                 Huston Foley

## 2019-08-31 NOTE — Interval H&P Note (Signed)
History and Physical Interval Note:  08/31/2019 12:52 PM  Susan Randolph  has presented today for surgery, with the diagnosis of LEFT BREAST CANCER.  The various methods of treatment have been discussed with the patient and family.  Seed in good location.  Her husband is here with her.  After consideration of risks, benefits and other options for treatment, the patient has consented to  Procedure(s): LEFT BREAST LUMPECTOMY WITH RADIOACTIVE SEED AND LEFT AXILLARY SENTINEL LYMPH NODE BIOPSY (Left) as a surgical intervention.  The patient's history has been reviewed, patient examined, no change in status, stable for surgery.  I have reviewed the patient's chart and labs.  Questions were answered to the patient's satisfaction.     Shann Medal

## 2019-08-31 NOTE — Progress Notes (Signed)
Assisted Dr. Hatchett with left, ultrasound guided, pectoralis block. Side rails up, monitors on throughout procedure. See vital signs in flow sheet. Tolerated Procedure well. 

## 2019-08-31 NOTE — Op Note (Addendum)
08/31/2019  2:30 PM  PATIENT:  Susan Randolph DOB: November 19, 1945 MRN: [id number]  PREOP DIAGNOSIS:   LEFT BREAST CANCER  POSTOP DIAGNOSIS:    Left breast cancer, 1 o'clock position (T1, N0)  PROCEDURE:   Procedure(s):  LEFT BREAST LUMPECTOMY WITH RADIOACTIVE SEED AND LEFT AXILLARY SENTINEL LYMPH NODE BIOPSY, deep sentinel lymph node biopsy  SURGEON:   Alphonsa Overall, M.D.  ANESTHESIA:   General  Anesthesiologist: Lyn Hollingshead, MD CRNA: Maryella Shivers, CRNA  General  EBL:  50  ml  DRAINS:  none   LOCAL MEDICATIONS USED:   20 cc 1/4% marcaine and left pectoral block by anesthesia  SPECIMEN:   Left breast lumpectomy (6 color paint), Left axillary sentinel lymph node (counts 190, background 5)  COUNTS CORRECT:  YES  INDICATIONS FOR PROCEDURE:  Susan Randolph is a 73 y.o. (DOB: Jan 29, 1946) whtie female whose primary care physician is Josetta Huddle, MD and comes for left breast lumpectomy and left axillary sentinel lymph node biopsy.   She [facility] with Drs. Magrinat and Isidore Moos for a left breast cancer.  The options for breast cancer treatment have been discussed with the patient. She elected to proceed with lumpectomy and axillary sentinel lymph node.     The indications and potential complications of surgery were explained to the patient. Potential complications include, but are not limited to, bleeding, infection, the need for further surgery, and nerve injury.     She had a I131 seed placed on 08/30/2019 in her left breast at [facility].  The seed is in the 1 o'clock position of the left breast.   In the holding area, her left areola was injected with 1 millicurie of Technitium Sulfur Colloid.  OPERATIVE NOTE:   The patient was taken to operating room # 7 at [facility] where she underwent a general anesthesia  supervised by Anesthesiologist: Lyn Hollingshead, MD CRNA: Maryella Shivers, CRNA. Her left breast and  axilla were prepped with  ChloraPrep and sterilely draped.    A time-out was held and the surgical check list was reviewed.    The cancer was about at the 1 o'clock position of the left breast.   It was 3 cm from the areola.  I used the Neoprobe to identify the I131 seed.  I made a superiorly based circumareolar incision.   I tried to excise an area around the tumor of at least 1 cm.    I excised this block of breast tissue approximately 3 cm by 4 cm  in diameter.  I took the dissection down to the chest wall.  I painted the lumpectomy specimen with the 6 color paint kit and did a specimen mammogram which confirmed the mass, clip, and the seed were all in the right position in the specimen.  The specimen was sent to pathology who called back to confirm that they have the seed and the specimen.   I then started the left deep axillary sentinel lymph node biopsy. I made an incision in the left axilla.  I found a hot area at the junction of the breast and the pectoralis major muscle, deep in the axilla. I cut down and  identified a hot node that had counts of 190 and the background has 5 counts.  I checked her internal mammary nodes and supraclavicular nodes with the neoprobe and found no other hot area. The axillary node was then sent to pathology.  I then irrigated the wound with saline. I infiltrated 20 mL of 1/4% Marcaine between the incisions. I placed 4 clips to mark biopsy cavity, at 12, 3, 6, and 9 o'clock.  I then closed all the wounds in layers using 3-0 Vicryl sutures for the deep layer. At the skin, I closed the incisions with a 4-0 Monocryl suture. The incisions were then painted with Dermabond.  She had gauze place over the wounds and placed in a breast binder.   The patient tolerated the procedure well, was transported to the recovery room in good condition. Sponge and needle count were correct at the end of the case.   Final pathology is pending.   08/31/2019 Left breast  lumpectomy   Alphonsa Overall, MD, [facility] Pager: [phone] Office phone:  [phone]

## 2019-08-31 NOTE — Discharge Instructions (Signed)
[facility] - DISCHARGE INSTRUCTIONS TO PATIENT  Activity:  Driving - May drive in 2 to 4 days, if doing well and off pain meds   Lifting - No lifting more than 15 pounds for one week, then no limit                       Practice your Covid-19 protection:  Wear a mask, social distance, and wash your hands frequently  Wound Care:   Leave the incision dry for 2 days, then you may shower  Diet:  As tolerated  Follow up appointment:  Call Dr. Pollie Friar office [facility]) at [phone] for an appointment in 2 to 3 weeks..  Medications and dosages:  Resume your home medications. *You had 1000 mg of Tylenol at 11:30am  You have a prescription for:  Vicodin  Call Dr. Lucia Gaskins or his office  [phone]) if you have:  Temperature greater than 100.4,  Persistent nausea and vomiting,  Severe uncontrolled pain,  Redness, tenderness, or signs of infection (pain, swelling, redness, odor or green/yellow discharge around the site),  Difficulty breathing, headache or visual disturbances,  Any other questions or concerns you may have after discharge.  In an emergency, call 911 or go to an Emergency Department at a nearby hospital.    Post Anesthesia Home Care Instructions  Activity: Get plenty of rest for the remainder of the day. A responsible individual must stay with you for 24 hours following the procedure.  For the next 24 hours, DO NOT: -Drive a car -Paediatric nurse -Drink alcoholic beverages -Take any medication unless instructed by your physician -Make any legal decisions or sign important papers.  Meals: Start with liquid foods such as gelatin or soup. Progress to regular foods as tolerated. Avoid greasy, spicy, heavy foods. If nausea and/or vomiting occur, drink only clear liquids until the nausea and/or vomiting subsides. Call your physician if vomiting continues.  Special Instructions/Symptoms: Your throat may feel dry or sore from the  anesthesia or the breathing tube placed in your throat during surgery. If this causes discomfort, gargle with warm salt water. The discomfort should disappear within 24 hours.  If you had a scopolamine patch placed behind your ear for the management of post- operative nausea and/or vomiting:  1. The medication in the patch is effective for 72 hours, after which it should be removed.  Wrap patch in a tissue and discard in the trash. Wash hands thoroughly with soap and water. 2. You may remove the patch earlier than 72 hours if you experience unpleasant side effects which may include dry mouth, dizziness or visual disturbances. 3. Avoid touching the patch. Wash your hands with soap and water after contact with the patch.

## 2019-08-31 NOTE — Anesthesia Preprocedure Evaluation (Signed)
Anesthesia Evaluation  Patient identified by MRN, date of birth, ID band Patient awake    Reviewed: Allergy & Precautions, NPO status , Patient's Chart, lab work & pertinent test results  Airway Mallampati: II  TM Distance: >3 FB Neck ROM: Limited    Dental no notable dental hx. (+) Teeth Intact   Pulmonary neg pulmonary ROS,    Pulmonary exam normal breath sounds clear to auscultation       Cardiovascular hypertension, Pt. on medications Normal cardiovascular exam Rhythm:Regular Rate:Normal     Neuro/Psych  Headaches, PSYCHIATRIC DISORDERS Anxiety    GI/Hepatic Neg liver ROS, GERD  Medicated and Controlled,  Endo/Other  negative endocrine ROS  Renal/GU negative Renal ROS  negative genitourinary   Musculoskeletal  (+) Arthritis , Osteoarthritis,    Abdominal Normal abdominal exam  (+)   Peds  Hematology negative hematology ROS (+)   Anesthesia Other Findings   Reproductive/Obstetrics                             Anesthesia Physical Anesthesia Plan  ASA: II  Anesthesia Plan: General   Post-op Pain Management:  Regional for Post-op pain   Induction: Intravenous  PONV Risk Score and Plan: 3 and Ondansetron, Dexamethasone and Midazolam  Airway Management Planned: LMA  Additional Equipment: None  Intra-op Plan:   Post-operative Plan: Extubation in OR  Informed Consent: I have reviewed the patients History and Physical, chart, labs and discussed the procedure including the risks, benefits and alternatives for the proposed anesthesia with the patient or authorized representative who has indicated his/her understanding and acceptance.     Dental advisory given  Plan Discussed with: CRNA  Anesthesia Plan Comments:         Anesthesia Quick Evaluation

## 2019-08-31 NOTE — Anesthesia Procedure Notes (Signed)
Anesthesia Regional Block: Pectoralis block   Pre-Anesthetic Checklist: ,, timeout performed, Correct Patient, Correct Site, Correct Laterality, Correct Procedure, Correct Position, site marked, Risks and benefits discussed,  Surgical consent,  Pre-op evaluation,  At surgeon's request and post-op pain management  Laterality: Left and N/A  Prep: chloraprep       Needles:  Injection technique: Single-shot  Needle Type: Echogenic Stimulator Needle     Needle Length: 10cm  Needle Gauge: 21   Needle insertion depth: 2.5 cm   Additional Needles:   Procedures:,,,, ultrasound used (permanent image in chart),,,,  Narrative:  Start time: 08/31/2019 12:20 PM End time: 08/31/2019 12:30 PM Injection made incrementally with aspirations every 5 mL.  Performed by: Personally  Anesthesiologist: Lyn Hollingshead, MD

## 2019-08-31 NOTE — Progress Notes (Signed)
Assisted Eddie, nuc med tech, with nuc med injections. Side rails up, monitors on throughout procedure. See vital signs in flow sheet. Tolerated Procedure well. 

## 2019-08-31 NOTE — Anesthesia Procedure Notes (Signed)
Procedure Name: LMA Insertion Date/Time: 08/31/2019 1:08 PM Performed by: Maryella Shivers, CRNA Pre-anesthesia Checklist: Patient identified, Emergency Drugs available, Suction available and Patient being monitored Patient Re-evaluated:Patient Re-evaluated prior to induction Oxygen Delivery Method: Circle system utilized Preoxygenation: Pre-oxygenation with 100% oxygen Induction Type: IV induction Ventilation: Mask ventilation without difficulty LMA: LMA inserted LMA Size: 4.0 Number of attempts: 1 Airway Equipment and Method: Bite block Placement Confirmation: positive ETCO2 Tube secured with: Tape Dental Injury: Teeth and Oropharynx as per pre-operative assessment

## 2019-08-31 NOTE — Transfer of Care (Signed)
Immediate Anesthesia Transfer of Care Note  Patient: Susan Randolph  Procedure(s) Performed: LEFT BREAST LUMPECTOMY WITH RADIOACTIVE SEED AND LEFT AXILLARY SENTINEL LYMPH NODE BIOPSY (Left Breast)  Patient Location: PACU  Anesthesia Type:General  Level of Consciousness: sedated  Airway & Oxygen Therapy: Patient Spontanous Breathing and Patient connected to nasal cannula oxygen  Post-op Assessment: Report given to RN and Post -op Vital signs reviewed and stable  Post vital signs: Reviewed and stable  Last Vitals:  Vitals Value Taken Time  BP 125/60 08/31/19 1438  Temp    Pulse 72 08/31/19 1441  Resp 13 08/31/19 1441  SpO2 100 % 08/31/19 1441  Vitals shown include unvalidated device data.  Last Pain:  Vitals:   08/31/19 1439  TempSrc:   PainSc: (P) Asleep      Patients Stated Pain Goal: 4 ([id number])  Complications: No apparent anesthesia complications

## 2019-09-01 ENCOUNTER — Encounter (HOSPITAL_BASED_OUTPATIENT_CLINIC_OR_DEPARTMENT_OTHER): Payer: Self-pay | Admitting: Surgery

## 2019-09-02 ENCOUNTER — Other Ambulatory Visit: Payer: Self-pay | Admitting: Oncology

## 2019-09-02 LAB — SURGICAL PATHOLOGY

## 2019-09-05 ENCOUNTER — Encounter: Payer: Self-pay | Admitting: *Deleted

## 2019-09-19 NOTE — Progress Notes (Signed)
Location of Breast Cancer: Left Breast  Histology per Pathology Report:  08/09/19 Diagnosis Breast, left, needle core biopsy, upper, 12 o'clock position - INVASIVE MAMMARY CARCINOMA.  Receptor Status: ER(100%), PR (95%), Her2-neu (NEG), Ki-(2%)  08/31/19 FINAL MICROSCOPIC DIAGNOSIS: A. BREAST, LEFT, LUMPECTOMY: - Invasive ductal carcinoma, Nottingham grade 2 of 3, 0.4 cm - Ductal carcinoma in-situ, intermediate grade - Margins uninvolved by carcinoma (0.5 cm; anterior margin) - Previous biopsy site changes present - See oncology table and comment below B. LYMPH NODE, LEFT AXILLARY, SENTINEL, BIOPSY: - No carcinoma identified in one lymph node (0/1) C. LYMPH NODE, LEFT AXILLARY, SENTINEL, BIOPSY: - No carcinoma identified in one lymph node (0/1)  Did patient present with symptoms or was this found on screening mammography?: It was discovered on a screening mammogram.   Past/Anticipated interventions by surgeon, if any: 08/31/19 PROCEDURE:   Procedure(s):  LEFT BREAST LUMPECTOMY WITH RADIOACTIVE SEED AND LEFT AXILLARY SENTINEL LYMPH NODE BIOPSY, deep sentinel lymph node biopsySURGEON:   David Newman, M.D.   Past/Anticipated interventions by medical oncology, if any:  [facility]. [facility] 08/17/19 The plan then is to start with surgery, then adjuvant radiation, then antiestrogens. Susan Randolph has a good understanding of the overall plan. She agrees with it. She knows the goal of treatment in her case is cure. She will call with any problems that may develop before her next visit here.  Lymphedema issues, if any:  She reports swelling to lymph node removal site. She tells me that it has improved slowly since surgery.   Pain issues, if any:  She reports chronic right knee and neck pain.   SAFETY ISSUES:  Prior radiation? No  Pacemaker/ICD? No  Possible current pregnancy? No  Is the patient on methotrexate? No  Current Complaints / other details:    BP 132/75 (BP  Location: Right Arm, Patient Position: Sitting)   Pulse 67   Temp 98 F (36.7 C) (Temporal)   Resp 18   Ht 5' 4" (1.626 m)   Wt 142 lb 12.8 oz (64.8 kg)   SpO2 100%   BMI 24.51 kg/m    Wt Readings from Last 3 Encounters:  09/21/19 142 lb 12.8 oz (64.8 kg)  08/31/19 142 lb 3.2 oz (64.5 kg)  08/17/19 142 lb 3.2 oz (64.5 kg)      Malmfelt, Jennifer L, RN 09/19/2019,3:17 PM   

## 2019-09-20 NOTE — Progress Notes (Signed)
Radiation Oncology         [phone] ________________________________  Name: Susan Randolph MRN: [id number]  Date: 09/21/2019  DOB: 1946/09/08  Follow-Up Visit Note  Outpatient  CC: Josetta Huddle, MD  Magrinat, Virgie Dad, MD  Diagnosis:      ICD-10-CM   1. Malignant neoplasm of upper-outer quadrant of left breast in female, estrogen receptor positive ([place])  C50.412    Z17.0      Cancer Staging Malignant neoplasm of upper-outer quadrant of left breast in female, estrogen receptor positive ([place]) Staging form: Breast, AJCC 8th Edition - Clinical stage from 08/17/2019: Stage IA (cT1b, cN0, cM0, G2, ER+, PR+, HER2-) - Unsigned   CHIEF COMPLAINT: Here to discuss management of left breast cancer  Narrative:  The patient returns today for follow-up to discuss radiation treatment options. She was seen in the multidisciplinary breast clinic on 08/17/2019.     She opted to proceed with left lumpectomy with sentinel lymph node biopsy on date of 08/31/2019 with pathology report revealing: tumor size of 0.4 cm; histology of ductal carcinoma; margin status to invasive disease of >5 mm and margin status to in situ disease of >5 mm; nodal status of negative (0/2); Grade 2.  Symptomatically, the patient reports: Doing well with some mild swelling underneath her incisions.  She eventually plans to start antiestrogen treatment        ALLERGIES:  is allergic to sulfa antibiotics and amitriptyline.  Meds: Current Outpatient Medications  Medication Sig Dispense Refill   acetaminophen (TYLENOL) 500 MG tablet Take 500 mg by mouth 2 (two) times daily as needed for mild pain.      ALPRAZolam (XANAX) 0.5 MG tablet Take 0.25-0.5 mg by mouth 3 (three) times daily as needed for anxiety.     bimatoprost (LUMIGAN) 0.01 % SOLN Place 1 drop into both eyes at bedtime.      Cholecalciferol (VITAMIN D3) 2000 units capsule Take 2,000 Units by mouth daily.      fluticasone (FLONASE) 50 MCG/ACT nasal spray  Place 2 sprays into both nostrils daily as needed for allergies or rhinitis.     Guaifenesin (MUCINEX MAXIMUM STRENGTH) 1200 MG TB12 Take 2 tablets by mouth 2 (two) times daily as needed (cold symptoms).     lisinopril-hydrochlorothiazide (PRINZIDE,ZESTORETIC) 10-12.5 MG tablet Take 1 tablet by mouth daily.  9   loratadine (CLARITIN) 10 MG tablet Take 10 mg by mouth daily.     Melatonin 10 MG CAPS Take by mouth.     meloxicam (MOBIC) 7.5 MG tablet Take 7.5 mg by mouth daily.     Multiple Vitamins-Minerals (ALIVE WOMENS 50+) TABS Take 1 tablet by mouth daily.     omeprazole (PRILOSEC) 20 MG capsule Take 20 mg by mouth daily.     traZODone (DESYREL) 50 MG tablet Take 50 mg by mouth at bedtime.     HYDROcodone-acetaminophen (NORCO/VICODIN) 5-325 MG tablet Take 1 tablet by mouth every 6 (six) hours as needed for moderate pain. (Patient not taking: Reported on 09/21/2019) 10 tablet 0   methocarbamol (ROBAXIN) 500 MG tablet TAKE 1 TABLET BY MOUTH THREE TIMES A DAY AS NEEDED FOR MUSCLE SPASMS     No current facility-administered medications for this encounter.     Physical Findings:  height is 5' 4"  (1.626 m) and weight is 142 lb 12.8 oz (64.8 kg). Her temporal temperature is 98 F (36.7 C). Her blood pressure is 132/75 and her pulse is 67. Her respiration is 18 and oxygen saturation is  100%.  General: Alert and oriented, in no acute distress Psychiatric: Judgment and insight are intact. Affect is appropriate. Breast exam reveals lumpectomy and axillary scars are healing well over left breast.  Mild seroma at axilla and moderate seroma of left breast  Lab Findings: Lab Results  Component Value Date   WBC 4.6 08/17/2019   HGB 12.9 08/17/2019   HCT 39.1 08/17/2019   MCV 90.1 08/17/2019   PLT 204 08/17/2019    Radiographic Findings: Nm Sentinel Node Inj-no Rpt (breast)  Result Date: 08/31/2019 Sulfur colloid was injected by the nuclear medicine technologist for melanoma sentinel  node.   Mm Breast Surgical Specimen  Result Date: 08/31/2019 CLINICAL DATA:  Biopsy proven invasive mammary carcinoma in the left breast. Status post surgical excision. EXAM: SPECIMEN RADIOGRAPH OF THE LEFT BREAST COMPARISON:  Previous exam(s). FINDINGS: Status post excision of the left breast. The radioactive seed and biopsy marker clip are present, completely intact, and were marked for pathology. IMPRESSION: Specimen radiograph of the left breast. Electronically Signed   By: Lillia Mountain M.D.   On: 08/31/2019 14:05   Dg Bone Density (dxa)  Result Date: 08/25/2019 EXAM: DUAL X-RAY ABSORPTIOMETRY (DXA) FOR BONE MINERAL DENSITY IMPRESSION: Referring Physician:  Christophe Louis Your patient completed a BMD test using Lunar IDXA DXA system ( analysis version: 16 ) manufactured by [employer]. Technologist: AW PATIENT: Name: Susan, Randolph Patient ID: [id number] Birth Date: [birth year] Height: 63.0 in. Sex: Female Measured: 08/25/2019 Weight: 143.2 lbs. Indications: Advanced Age, Caucasian, Desyrel, Estrogen Deficient, History of Osteopenia, Omeprazole, osteoarthiritis, Postmenopausal Fractures: None Treatments: Calcium (E943.0), Vitamin D (E933.5) ASSESSMENT: The BMD measured at Femur Neck Right is 0.789 g/cm2 with a T-score of -1.8. This patient is considered osteopenic according to [facility]) criteria. The scan quality is good. L-3 was excluded due to degenerative changes. Site Region Measured Date Measured Age YA BMD Significant CHANGE T-score DualFemur Neck Right 08/25/2019    72.4         -1.8    0.789 g/cm2 AP Spine  L1-L4 (L3) 08/25/2019    72.4         -0.8    1.076 g/cm2 DualFemur Total Mean 08/25/2019    72.4         -1.1    0.872 g/cm2 World Health Organization [facility]) criteria for post-menopausal, Caucasian Women: Normal       T-score at or above -1 SD Osteopenia   T-score between -1 and -2.5 SD Osteoporosis T-score at or below -2.5 SD RECOMMENDATION: 1. All patients should optimize  calcium and vitamin D intake. 2. Consider FDA approved medical therapies in postmenopausal women and men aged 86 years and older, based on the following: a. A hip or vertebral (clinical or morphometric) fracture b. T- score < or = -2.5 at the femoral neck or spine after appropriate evaluation to exclude secondary causes c. Low bone mass (T-score between -1.0 and -2.5 at the femoral neck or spine) and a 10 year probability of a hip fracture > or = 3% or a 10 year probability of a major osteoporosis-related fracture > or = 20% based on the [place]-adapted WHO algorithm d. Clinician judgment and/or patient preferences may indicate treatment for people with 10-year fracture probabilities above or below these levels FOLLOW-UP: Patients with diagnosis of osteoporosis or at high risk for fracture should have regular bone mineral density tests. For patients eligible for Medicare, routine testing is allowed once every 2 years. The testing frequency can be  increased to one year for patients who have rapidly progressing disease, those who are receiving or discontinuing medical therapy to restore bone mass, or have additional risk factors. I have reviewed this report and agree with the above findings. [facility] FRAX* 10-year Probability of Fracture Based on femoral neck BMD: DualFemur (Right) Major Osteoporotic Fracture: 11.7% Hip Fracture:                2.4% Population:                  [place] (Caucasian) Risk Factors:                None *FRAX is a Materials engineer of the [employer] for Metabolic Bone Disease, a World Pharmacologist (WHO) [facility]. ASSESSMENT: The probability of a major osteoporotic fracture is 11.7 % within the next ten years. The probability of a hip fracture is 2.4 % within the next ten years. Electronically Signed   By: Lowella Grip III M.D.   On: 08/25/2019 13:06   Mm Lt Radioactive Seed Loc Mammo Guide  Result Date: 08/30/2019 CLINICAL DATA:   Recently diagnosed invasive mammary carcinoma in the left breast. EXAM: MAMMOGRAPHIC GUIDED RADIOACTIVE SEED LOCALIZATION OF THE LEFT BREAST COMPARISON:  Previous exam(s). FINDINGS: Patient presents for radioactive seed localization prior to left lumpectomy. I met with the patient and we discussed the procedure of seed localization including benefits and alternatives. We discussed the high likelihood of a successful procedure. We discussed the risks of the procedure including infection, bleeding, tissue injury and further surgery. We discussed the low dose of radioactivity involved in the procedure. Informed, written consent was given. The usual time-out protocol was performed immediately prior to the procedure. Using mammographic guidance, sterile technique, 1% lidocaine and an I-125 radioactive seed, the recently placed ribbon biopsy marker clip in the upper-outer left breast was localized using a cephalad approach. The follow-up mammogram images confirm the seed in the expected location and were marked for Dr. Lucia Gaskins. Follow-up survey of the patient confirms presence of the radioactive seed. Order number of I-125 seed:  [id number]. Total activity:  0.244 mCi reference Date: 08/15/2019 The patient tolerated the procedure well and was released from the [place]. She was given instructions regarding seed removal. IMPRESSION: Radioactive seed localization left breast. No apparent complications. Electronically Signed   By: Claudie Revering M.D.   On: 08/30/2019 12:41    Impression/Plan: Left Breast Cancer  We again discussed radiotherapy acknowledging that it would not improve her overall life expectancy what but it would provide a  modest improvement in local control of her left breast cancer.  She would like to be proactive about pursuing radiotherapy; we also discussed the pros and cons of receiving a boost to the lumpectomy cavity, which she would like to pursue.    Therefore, I recommend 4 weeks directed  at the left breast.  The risks, benefits and side effects of this treatment were discussed in detail.  She understands that radiotherapy is associated with skin irritation and fatigue in the acute setting. Late effects can include cosmetic changes and rare injury to internal organs.  She is enthusiastic about proceeding with treatment. A consent form has been signed and placed in her chart.  Simulation will take place later this morning.  I spent over 10 minutes minutes face to face with the patient and more than 50% of that time was spent in counseling and/or coordination of care. _____________________________________  Eppie Gibson, MD   This document serves as a record of services personally performed by Eppie Gibson, MD. It was created on her behalf by Wilburn Mylar, a trained medical scribe. The creation of this record is based on the scribe's personal observations and the provider's statements to them. This document has been checked and approved by the attending provider.

## 2019-09-21 ENCOUNTER — Encounter: Payer: Self-pay | Admitting: Radiation Oncology

## 2019-09-21 ENCOUNTER — Ambulatory Visit
Admission: RE | Admit: 2019-09-21 | Discharge: 2019-09-21 | Disposition: A | Discharge status: Home / Self Care | Payer: Medicare Other | Source: Ambulatory Visit | Attending: Radiation Oncology | Admitting: Radiation Oncology

## 2019-09-21 ENCOUNTER — Other Ambulatory Visit: Payer: Self-pay

## 2019-09-21 DIAGNOSIS — Z79899 Other long term (current) drug therapy: Secondary | ICD-10-CM | POA: Diagnosis not present

## 2019-09-21 DIAGNOSIS — C50412 Malignant neoplasm of upper-outer quadrant of left female breast: Secondary | ICD-10-CM

## 2019-09-21 DIAGNOSIS — Z170 Estrogen receptor positive status [ER+]: Secondary | ICD-10-CM | POA: Insufficient documentation

## 2019-09-21 NOTE — Progress Notes (Signed)
Radiation Oncology         [phone] ________________________________  Name: CINTHYA DATTA MRN: [id number]  Date: 09/21/2019  DOB: 10-25-1945  SIMULATION AND TREATMENT PLANNING NOTE /  Special treatment procedure   Outpatient  DIAGNOSIS:     ICD-10-CM   1. Malignant neoplasm of upper-outer quadrant of left breast in female, estrogen receptor positive ([place])  C50.412    Z17.0    NARRATIVE:  The patient was brought to the [facility]. Identity was confirmed. All relevant records and images related to the planned course of therapy were reviewed. The patient freely provided informed written consent to proceed with treatment after reviewing the details related to the planned course of therapy. The consent form was witnessed and verified by the simulation staff.    Then, the patient was set-up in a stable reproducible supine position for radiation therapy with her ipsilateral arm over her head, and her upper body secured in a custom-made Vac-lok device. CT images were obtained.  Surface markings were placed. The CT images were loaded into the planning software.    Special treatment procedure:  Special treatment procedure was performed today due to the extra time and effort required by myself to plan and prepare this patient for deep inspiration breath hold technique.  I have determined cardiac sparing to be of benefit to this patient to prevent long term cardiac damage due to radiation of the heart.  Bellows were placed on the patient's abdomen. To facilitate cardiac sparing, the patient was coached by the radiation therapists on breath hold techniques and breathing practice was performed. Practice waveforms were obtained. The patient was then scanned while maintaining breath hold in the treatment position.  This image was then transferred over to the imaging specialist. The imaging specialist then created a fusion of the free breathing and breath hold scans using the chest wall as  the stable structure. I personally reviewed the fusion in axial, coronal and sagittal image planes.  Excellent cardiac sparing was obtained.  I felt the patient is an appropriate candidate for breath hold and the patient will be treated as such.  The image fusion was then reviewed with the patient to reinforce the necessity of reproducible breath hold.  TREATMENT PLANNING NOTE: Treatment planning then occurred.  The radiation prescription was entered and confirmed.     A total of 3 medically necessary complex treatment devices were fabricated and supervised by me: 2 fields with MLCs for custom blocks to protect heart, and lungs;  and, a Vac-lok. MORE COMPLEX DEVICES MAY BE MADE IN DOSIMETRY FOR FIELD IN FIELD BEAMS FOR DOSE HOMOGENEITY.  I have requested : 3D Simulation which is medically necessary to give adequate dose to at risk tissues while sparing lungs and heart.  I have requested a DVH of the following structures: lungs, heart, Left lumpectomy cavity.    The patient will receive 40.05 Gy in 15 fractions to the left breast with 2 tangential fields.  This will be followed by a boost.  Optical Surface Tracking Plan:  Since intensity modulated radiotherapy (IMRT) and 3D conformal radiation treatment methods are predicated on accurate and precise positioning for treatment, intrafraction motion monitoring is medically necessary to ensure accurate and safe treatment delivery. The ability to quantify intrafraction motion without excessive ionizing radiation dose can only be performed with optical surface tracking. Accordingly, surface imaging offers the opportunity to obtain 3D measurements of patient position throughout IMRT and 3D treatments without excessive radiation exposure. I am  ordering optical surface tracking for this patient's upcoming course of radiotherapy.  ________________________________   Reference:  Ursula Alert, J, et al. Surface imaging-based analysis of intrafraction  motion for breast radiotherapy patients.Journal of [place], n. 6, nov. 2014. ISSN [id number].  Available at: <http://www.jacmp.org/index.php/jacmp/article/view/4957>.    -----------------------------------  Eppie Gibson, MD

## 2019-09-22 ENCOUNTER — Encounter: Payer: Self-pay | Admitting: *Deleted

## 2019-09-23 DIAGNOSIS — C50412 Malignant neoplasm of upper-outer quadrant of left female breast: Secondary | ICD-10-CM | POA: Diagnosis not present

## 2019-09-26 ENCOUNTER — Telehealth: Payer: Self-pay | Admitting: Oncology

## 2019-09-26 NOTE — Telephone Encounter (Signed)
Confirmed February and May appointments with patient.

## 2019-09-28 ENCOUNTER — Ambulatory Visit: Payer: Medicare Other | Admitting: Radiation Oncology

## 2019-09-29 ENCOUNTER — Ambulatory Visit: Payer: Medicare Other

## 2019-09-29 ENCOUNTER — Ambulatory Visit: Payer: Medicare Other | Admitting: Radiation Oncology

## 2019-09-29 ENCOUNTER — Ambulatory Visit: Payer: Medicare Other | Admitting: Physical Therapy

## 2019-09-30 ENCOUNTER — Ambulatory Visit: Payer: Medicare Other

## 2019-10-03 ENCOUNTER — Ambulatory Visit: Payer: Medicare Other

## 2019-10-04 ENCOUNTER — Ambulatory Visit: Payer: Medicare Other

## 2019-10-05 ENCOUNTER — Ambulatory Visit: Payer: Medicare Other

## 2019-10-06 ENCOUNTER — Ambulatory Visit: Payer: Medicare Other

## 2019-10-07 ENCOUNTER — Ambulatory Visit: Payer: Medicare Other

## 2019-10-10 ENCOUNTER — Ambulatory Visit: Payer: Medicare Other

## 2019-10-11 ENCOUNTER — Ambulatory Visit: Payer: Medicare Other

## 2019-10-12 ENCOUNTER — Ambulatory Visit: Payer: Medicare Other

## 2019-10-13 ENCOUNTER — Ambulatory Visit: Payer: Medicare Other

## 2019-10-17 ENCOUNTER — Other Ambulatory Visit: Payer: Self-pay

## 2019-10-17 ENCOUNTER — Encounter: Payer: Self-pay | Admitting: Physical Therapy

## 2019-10-17 ENCOUNTER — Ambulatory Visit: Payer: Medicare Other | Attending: Surgery | Admitting: Physical Therapy

## 2019-10-17 ENCOUNTER — Ambulatory Visit: Payer: Medicare Other | Admitting: Radiation Oncology

## 2019-10-17 ENCOUNTER — Ambulatory Visit: Payer: Medicare Other

## 2019-10-17 DIAGNOSIS — C50412 Malignant neoplasm of upper-outer quadrant of left female breast: Secondary | ICD-10-CM | POA: Diagnosis not present

## 2019-10-17 DIAGNOSIS — R600 Localized edema: Secondary | ICD-10-CM | POA: Diagnosis present

## 2019-10-17 DIAGNOSIS — Z483 Aftercare following surgery for neoplasm: Secondary | ICD-10-CM | POA: Insufficient documentation

## 2019-10-17 DIAGNOSIS — Z170 Estrogen receptor positive status [ER+]: Secondary | ICD-10-CM | POA: Insufficient documentation

## 2019-10-17 DIAGNOSIS — R293 Abnormal posture: Secondary | ICD-10-CM

## 2019-10-17 NOTE — Therapy (Addendum)
[place], [place], [address] Phone: [phone]   Fax:  [phone]  Physical Therapy Treatment  Patient Details  Name: Susan Randolph MRN: [id number] Date of Birth: [birth year] Referring Provider (PT): Dr. Alphonsa Overall   Encounter Date: 10/17/2019  PT End of Session - 10/17/19 1128    Visit Number  2    Number of Visits  2    PT Start Time  1050    PT Stop Time  1145    PT Time Calculation (min)  55 min    Activity Tolerance  Patient tolerated treatment well    Behavior During Therapy  [facility] for tasks assessed/performed       Past Medical History:  Diagnosis Date  . Anxiety   . Arthritis   . Breast cancer ([place]) 06/2019   left IMC  . Cancer ([place])    basal cell carcinoma  . GERD (gastroesophageal reflux disease)   . Headache   . Hypertension   . Rectal bleed 09/2016    Past Surgical History:  Procedure Laterality Date  . BREAST LUMPECTOMY WITH RADIOACTIVE SEED AND SENTINEL LYMPH NODE BIOPSY Left 08/31/2019   Procedure: LEFT BREAST LUMPECTOMY WITH RADIOACTIVE SEED AND LEFT AXILLARY SENTINEL LYMPH NODE BIOPSY;  Surgeon: Alphonsa Overall, MD;  Location: [place];  Service: General;  Laterality: Left;  . EYE SURGERY     Corrected drooping eye lids bilateral  . MENISCUS REPAIR Right 03/29/2019  . POSTERIOR CERVICAL FUSION/FORAMINOTOMY N/A 01/11/2018   Procedure: Posterior Cervical Fusion with lateral mass fixation - Cervical one-Cervical two with Iliac Crest bone graft;  Surgeon: Earnie Larsson, MD;  Location: [facility];  Service: Neurosurgery;  Laterality: N/A;  . TUBAL LIGATION      There were no vitals filed for this visit.  Subjective Assessment - 10/17/19 1049    Subjective  Patient underwent a left lumpectomy and sentinel node biopsy on 08/31/2019. She had 2 negative nodes removed. She was planning to begin radiation a few weeks ago but was diagnosed on 09/27/2019 with Covid-19. She  reports being very sick for 2 weeks and now feels ok but has fatigue. She is scheduled to begin radiation on 10/24/2019.    Pertinent History  Patient was diagnosed on 08/02/2019 with left grade II invasive ductal carcinoma breast cancer. Patient underwent a left lumpectomy and sentinel node biopsy on 08/31/2019.  It is ER/PR positive and HER negative with a Ki67 of 2%. She had a previous right knee meniscal repair on 03/29/2019 and has been undergoing PT for that with little success. She had a cervical fusion at C1-C2 in March 2019.    Patient Stated Goals  See if my arm is ok    Currently in Pain?  No/denies         [facility] PT Assessment - 10/17/19 0001      Assessment   Medical Diagnosis  s/p left lumpectomy and SLNB    Referring Provider (PT)  Dr. Alphonsa Overall    Onset Date/Surgical Date  08/31/19    Hand Dominance  Right    Prior Therapy  Baselines      Precautions   Precautions  Other (comment)    Precaution Comments  recent surgery      Restrictions   Weight Bearing Restrictions  No      Balance Screen   Has the patient fallen in the past 6 months  No    Has the patient had a  decrease in activity level because of a fear of falling?   No    Is the patient reluctant to leave their home because of a fear of falling?   No      Home Film/video editor residence    Living Arrangements  Spouse/significant other    Available Help at Discharge  Family      Prior Function   Level of [place]  Retired    Biomedical scientist  Retired Education officer, museum    Leisure  She does not exercise due to knee pain      Cognition   Overall Cognitive Status  Within Functional Limits for tasks assessed      Observation/Other Assessments   Observations  Incisions on left breast and axilla have both healed very well. There is a hardened area superior to her incision which appears to be an organized seroma and would benefit from compression.       Posture/Postural Control   Posture/Postural Control  Postural limitations    Postural Limitations  Rounded Shoulders;Forward head      ROM / Strength   AROM / PROM / Strength  AROM      AROM   AROM Assessment Site  Shoulder    Right/Left Shoulder  Left    Left Shoulder Extension  60 Degrees    Left Shoulder Flexion  144 Degrees    Left Shoulder ABduction  159 Degrees    Left Shoulder Internal Rotation  59 Degrees    Left Shoulder External Rotation  66 Degrees        LYMPHEDEMA/ONCOLOGY QUESTIONNAIRE - 10/17/19 1058      Type   Cancer Type  Left breast cancer      Surgeries   Lumpectomy Date  08/31/19    Sentinel Lymph Node Biopsy Date  08/31/19    Number Lymph Nodes Removed  2      Treatment   Active Chemotherapy Treatment  No    Past Chemotherapy Treatment  No    Active Radiation Treatment  No    Past Radiation Treatment  No    Current Hormone Treatment  No    Past Hormone Therapy  No      What other symptoms do you have   Are you Having Heaviness or Tightness  No    Are you having Pain  No    Are you having pitting edema  No    Is it Hard or Difficult finding clothes that fit  No    Do you have infections  No    Is there Decreased scar mobility  Yes    Stemmer Sign  No      Lymphedema Assessments   Lymphedema Assessments  Upper extremities      Right Upper Extremity Lymphedema   10 cm Proximal to Olecranon Process  25.8 cm    Olecranon Process  22.8 cm    10 cm Proximal to Ulnar Styloid Process  20.5 cm    Just Proximal to Ulnar Styloid Process  15.2 cm    Across Hand at [employer]  18.2 cm    At [place] of 2nd Digit  6 cm      Left Upper Extremity Lymphedema   10 cm Proximal to Olecranon Process  26.7 cm    Olecranon Process  23 cm    10 cm Proximal to Ulnar Styloid Process  20.6 cm    Just  Proximal to Ulnar Styloid Process  14.9 cm    Across Hand at [employer]  17.8 cm    At [place] of 2nd Digit  6 cm        Katina Dung - 10/17/19 0001     Open a tight or new jar  Moderate difficulty    Do heavy household chores (wash walls, wash floors)  Severe difficulty    Carry a shopping bag or briefcase  Mild difficulty    Wash your back  Mild difficulty    Use a knife to cut food  No difficulty    Recreational activities in which you take some force or impact through your arm, shoulder, or hand (golf, hammering, tennis)  No difficulty    During the past week, to what extent has your arm, shoulder or hand problem interfered with your normal social activities with family, friends, neighbors, or groups?  Not at all    During the past week, to what extent has your arm, shoulder or hand problem limited your work or other regular daily activities  Not at all    Arm, shoulder, or hand pain.  None    Tingling (pins and needles) in your arm, shoulder, or hand  None    Difficulty Sleeping  No difficulty    DASH Score  15.91 %                     PT Education - 10/17/19 1126    Education Details  Lymphedema risk reduction and signed up for ABC class; applied chip pack to left breast and instructed her to use it to break up hard tissue    Person(s) Educated  Patient    Methods  Explanation;Demonstration;Handout    Comprehension  Returned demonstration;Verbalized understanding          PT Long Term Goals - 10/17/19 1138      PT LONG TERM GOAL #1   Title  Patient will demonstrate she has regained shoulder ROM and function post operatively compared to baselines.    Time  8    Period  Weeks    Status  Achieved            Plan - 10/17/19 1129    Clinical Impression Statement  Patient is doing very well s/p left lumpectomy and sentinel node biopsy on 08/31/2019. Unfortunately she was diagnosed with covid on 09/27/2019 but has nearly recovered now. Her shoulder ROM and function has returned to baseline. Her only issue is that she appears to have an organized seroma present on her left superior breast. She was fitted to day  with a compression bra and Jovi Pal today to help break up hardness and reduce edema. She plans to attend the After Breast Cancer class on 12/05/2019. Otherwise she has no PT needs at this time.    PT Treatment/Interventions  ADLs/Self Care Home Management;Patient/family education;Therapeutic exercise    PT Next Visit Plan  D/C - goals met    PT Home Exercise Plan  Post op shoulder ROM HEP    Consulted and Agree with Plan of Care  Patient       Patient will benefit from skilled therapeutic intervention in order to improve the following deficits and impairments:  Postural dysfunction, Decreased range of motion, Pain, Impaired UE functional use, Decreased knowledge of precautions, Increased edema  Visit Diagnosis: Malignant neoplasm of upper-outer quadrant of left breast in female, estrogen receptor positive (HCC)  Abnormal posture  Aftercare following surgery for neoplasm  Localized edema     Problem List Patient Active Problem List   Diagnosis Date Noted  . Malignant neoplasm of upper-outer quadrant of left breast in female, estrogen receptor positive ([place]) 08/12/2019  . Osteoarthritis of occipito-atlanto-axial spinal region 01/11/2018  . Polyarthralgia 12/29/2016  . DJD (degenerative joint disease), cervical 12/29/2016  . Spondylosis of lumbar region without myelopathy or radiculopathy 12/29/2016  . Primary osteoarthritis of both hands 12/29/2016  . Essential hypertension 12/29/2016  . Restless leg syndrome 12/29/2016  . Allergic rhinitis due to allergen 12/29/2016  . History of vitamin D deficiency 12/29/2016  . History of basal cell carcinoma 12/29/2016  . Gastroesophageal reflux disease without esophagitis 12/29/2016  . History of anxiety 12/29/2016   PHYSICAL THERAPY DISCHARGE SUMMARY  Visits from Start of Care: 2  Current functional level related to goals / functional outcomes: Goals met. See above for objective findings.   Remaining deficits: Edema present in left  superior breast with hardness present.   Education / Equipment: Lymphedema risk reduction and reviewed HEP Plan: Patient agrees to discharge.  Patient goals were met. Patient is being discharged due to meeting the stated rehab goals.  ?????         Annia Friendly, [place] 10/17/19 12:53 PM  [place], [place], [address] Phone: [phone]   Fax:  [phone]  Name: JULLIETTE FRENTZ MRN: [id number] Date of Birth: 09/28/1946

## 2019-10-18 ENCOUNTER — Ambulatory Visit: Payer: Medicare Other

## 2019-10-19 ENCOUNTER — Ambulatory Visit: Payer: Medicare Other

## 2019-10-20 ENCOUNTER — Ambulatory Visit: Payer: Medicare Other

## 2019-10-21 DIAGNOSIS — Z923 Personal history of irradiation: Secondary | ICD-10-CM

## 2019-10-21 HISTORY — DX: Personal history of irradiation: Z92.3

## 2019-10-24 ENCOUNTER — Other Ambulatory Visit: Payer: Self-pay

## 2019-10-24 ENCOUNTER — Ambulatory Visit: Payer: Medicare PPO

## 2019-10-24 ENCOUNTER — Ambulatory Visit
Admission: RE | Admit: 2019-10-24 | Discharge: 2019-10-24 | Disposition: A | Discharge status: Home / Self Care | Payer: Medicare PPO | Source: Ambulatory Visit | Attending: Radiation Oncology | Admitting: Radiation Oncology

## 2019-10-24 DIAGNOSIS — Z170 Estrogen receptor positive status [ER+]: Secondary | ICD-10-CM | POA: Insufficient documentation

## 2019-10-24 DIAGNOSIS — C50412 Malignant neoplasm of upper-outer quadrant of left female breast: Secondary | ICD-10-CM | POA: Diagnosis present

## 2019-10-24 MED ORDER — SONAFINE EX EMUL
1.0000 "application " | Freq: Once | CUTANEOUS | Status: AC
  Administered 2019-10-24: 1 via TOPICAL

## 2019-10-24 MED ORDER — ALRA NON-METALLIC DEODORANT (RAD-ONC)
1.0000 "application " | Freq: Once | TOPICAL | Status: AC
  Administered 2019-10-24: 1 via TOPICAL

## 2019-10-24 NOTE — Progress Notes (Signed)
Pt here for patient teaching.  Pt given Radiation and You booklet, skin care instructions, Alra deodorant and Sonafine.  Reviewed areas of pertinence such as fatigue, skin changes, breast tenderness and breast swelling . Pt able to give teach back of to pat skin and drink plenty of water,apply Radiaplex bid, avoid applying anything to skin within 4 hours of treatment, avoid wearing an under wire bra and to use an electric razor if they must shave. Pt verbalizes understanding of information given and will contact nursing with any questions or concerns.     [email or web address]

## 2019-10-25 ENCOUNTER — Other Ambulatory Visit: Payer: Self-pay

## 2019-10-25 ENCOUNTER — Ambulatory Visit
Admission: RE | Admit: 2019-10-25 | Discharge: 2019-10-25 | Disposition: A | Discharge status: Home / Self Care | Payer: Medicare PPO | Source: Ambulatory Visit | Attending: Radiation Oncology | Admitting: Radiation Oncology

## 2019-10-25 DIAGNOSIS — C50412 Malignant neoplasm of upper-outer quadrant of left female breast: Secondary | ICD-10-CM | POA: Diagnosis not present

## 2019-10-26 ENCOUNTER — Encounter: Payer: Self-pay | Admitting: *Deleted

## 2019-10-26 ENCOUNTER — Other Ambulatory Visit: Payer: Self-pay

## 2019-10-26 ENCOUNTER — Ambulatory Visit
Admission: RE | Admit: 2019-10-26 | Discharge: 2019-10-26 | Disposition: A | Discharge status: Home / Self Care | Payer: Medicare PPO | Source: Ambulatory Visit | Attending: Radiation Oncology | Admitting: Radiation Oncology

## 2019-10-26 DIAGNOSIS — C50412 Malignant neoplasm of upper-outer quadrant of left female breast: Secondary | ICD-10-CM | POA: Diagnosis not present

## 2019-10-27 ENCOUNTER — Ambulatory Visit
Admission: RE | Admit: 2019-10-27 | Discharge: 2019-10-27 | Disposition: A | Discharge status: Home / Self Care | Payer: Medicare PPO | Source: Ambulatory Visit | Attending: Radiation Oncology | Admitting: Radiation Oncology

## 2019-10-27 ENCOUNTER — Ambulatory Visit: Payer: Medicare PPO

## 2019-10-27 ENCOUNTER — Other Ambulatory Visit: Payer: Self-pay

## 2019-10-27 DIAGNOSIS — C50412 Malignant neoplasm of upper-outer quadrant of left female breast: Secondary | ICD-10-CM | POA: Diagnosis not present

## 2019-10-28 ENCOUNTER — Other Ambulatory Visit: Payer: Self-pay

## 2019-10-28 ENCOUNTER — Ambulatory Visit
Admission: RE | Admit: 2019-10-28 | Discharge: 2019-10-28 | Disposition: A | Discharge status: Home / Self Care | Payer: Medicare PPO | Source: Ambulatory Visit | Attending: Radiation Oncology | Admitting: Radiation Oncology

## 2019-10-28 DIAGNOSIS — C50412 Malignant neoplasm of upper-outer quadrant of left female breast: Secondary | ICD-10-CM | POA: Diagnosis not present

## 2019-10-31 ENCOUNTER — Ambulatory Visit
Admission: RE | Admit: 2019-10-31 | Discharge: 2019-10-31 | Disposition: A | Discharge status: Home / Self Care | Payer: Medicare PPO | Source: Ambulatory Visit | Attending: Radiation Oncology | Admitting: Radiation Oncology

## 2019-10-31 ENCOUNTER — Other Ambulatory Visit: Payer: Self-pay

## 2019-10-31 DIAGNOSIS — C50412 Malignant neoplasm of upper-outer quadrant of left female breast: Secondary | ICD-10-CM | POA: Diagnosis not present

## 2019-11-01 ENCOUNTER — Other Ambulatory Visit: Payer: Self-pay

## 2019-11-01 ENCOUNTER — Ambulatory Visit
Admission: RE | Admit: 2019-11-01 | Discharge: 2019-11-01 | Disposition: A | Discharge status: Home / Self Care | Payer: Medicare PPO | Source: Ambulatory Visit | Attending: Radiation Oncology | Admitting: Radiation Oncology

## 2019-11-01 DIAGNOSIS — C50412 Malignant neoplasm of upper-outer quadrant of left female breast: Secondary | ICD-10-CM | POA: Diagnosis not present

## 2019-11-02 ENCOUNTER — Ambulatory Visit
Admission: RE | Admit: 2019-11-02 | Discharge: 2019-11-02 | Disposition: A | Discharge status: Home / Self Care | Payer: Medicare PPO | Source: Ambulatory Visit | Attending: Radiation Oncology | Admitting: Radiation Oncology

## 2019-11-02 ENCOUNTER — Other Ambulatory Visit: Payer: Self-pay

## 2019-11-02 DIAGNOSIS — C50412 Malignant neoplasm of upper-outer quadrant of left female breast: Secondary | ICD-10-CM | POA: Diagnosis not present

## 2019-11-03 ENCOUNTER — Ambulatory Visit
Admission: RE | Admit: 2019-11-03 | Discharge: 2019-11-03 | Disposition: A | Discharge status: Home / Self Care | Payer: Medicare PPO | Source: Ambulatory Visit | Attending: Radiation Oncology | Admitting: Radiation Oncology

## 2019-11-03 ENCOUNTER — Other Ambulatory Visit: Payer: Self-pay

## 2019-11-03 DIAGNOSIS — C50412 Malignant neoplasm of upper-outer quadrant of left female breast: Secondary | ICD-10-CM | POA: Diagnosis not present

## 2019-11-04 ENCOUNTER — Ambulatory Visit
Admission: RE | Admit: 2019-11-04 | Discharge: 2019-11-04 | Disposition: A | Discharge status: Home / Self Care | Payer: Medicare PPO | Source: Ambulatory Visit | Attending: Radiation Oncology | Admitting: Radiation Oncology

## 2019-11-04 ENCOUNTER — Other Ambulatory Visit: Payer: Self-pay

## 2019-11-04 DIAGNOSIS — C50412 Malignant neoplasm of upper-outer quadrant of left female breast: Secondary | ICD-10-CM | POA: Diagnosis not present

## 2019-11-07 ENCOUNTER — Ambulatory Visit
Admission: RE | Admit: 2019-11-07 | Discharge: 2019-11-07 | Disposition: A | Discharge status: Home / Self Care | Payer: Medicare PPO | Source: Ambulatory Visit | Attending: Radiation Oncology | Admitting: Radiation Oncology

## 2019-11-07 ENCOUNTER — Other Ambulatory Visit: Payer: Self-pay

## 2019-11-07 ENCOUNTER — Ambulatory Visit: Payer: Medicare PPO | Admitting: Radiation Oncology

## 2019-11-07 DIAGNOSIS — C50412 Malignant neoplasm of upper-outer quadrant of left female breast: Secondary | ICD-10-CM | POA: Diagnosis not present

## 2019-11-08 ENCOUNTER — Ambulatory Visit
Admission: RE | Admit: 2019-11-08 | Discharge: 2019-11-08 | Disposition: A | Discharge status: Home / Self Care | Payer: Medicare PPO | Source: Ambulatory Visit | Attending: Radiation Oncology | Admitting: Radiation Oncology

## 2019-11-08 ENCOUNTER — Other Ambulatory Visit: Payer: Self-pay

## 2019-11-08 DIAGNOSIS — C50412 Malignant neoplasm of upper-outer quadrant of left female breast: Secondary | ICD-10-CM | POA: Diagnosis not present

## 2019-11-09 ENCOUNTER — Ambulatory Visit
Admission: RE | Admit: 2019-11-09 | Discharge: 2019-11-09 | Disposition: A | Discharge status: Home / Self Care | Payer: Medicare PPO | Source: Ambulatory Visit | Attending: Radiation Oncology | Admitting: Radiation Oncology

## 2019-11-09 ENCOUNTER — Other Ambulatory Visit: Payer: Self-pay

## 2019-11-09 DIAGNOSIS — C50412 Malignant neoplasm of upper-outer quadrant of left female breast: Secondary | ICD-10-CM | POA: Diagnosis not present

## 2019-11-10 ENCOUNTER — Ambulatory Visit
Admission: RE | Admit: 2019-11-10 | Discharge: 2019-11-10 | Disposition: A | Discharge status: Home / Self Care | Payer: Medicare PPO | Source: Ambulatory Visit | Attending: Radiation Oncology | Admitting: Radiation Oncology

## 2019-11-10 ENCOUNTER — Other Ambulatory Visit: Payer: Self-pay

## 2019-11-10 DIAGNOSIS — C50412 Malignant neoplasm of upper-outer quadrant of left female breast: Secondary | ICD-10-CM | POA: Diagnosis not present

## 2019-11-11 ENCOUNTER — Other Ambulatory Visit: Payer: Self-pay

## 2019-11-11 ENCOUNTER — Ambulatory Visit
Admission: RE | Admit: 2019-11-11 | Discharge: 2019-11-11 | Disposition: A | Discharge status: Home / Self Care | Payer: Medicare PPO | Source: Ambulatory Visit | Attending: Radiation Oncology | Admitting: Radiation Oncology

## 2019-11-11 DIAGNOSIS — C50412 Malignant neoplasm of upper-outer quadrant of left female breast: Secondary | ICD-10-CM | POA: Diagnosis not present

## 2019-11-14 ENCOUNTER — Ambulatory Visit
Admission: RE | Admit: 2019-11-14 | Discharge: 2019-11-14 | Disposition: A | Discharge status: Home / Self Care | Payer: Medicare PPO | Source: Ambulatory Visit | Attending: Radiation Oncology | Admitting: Radiation Oncology

## 2019-11-14 ENCOUNTER — Other Ambulatory Visit: Payer: Self-pay

## 2019-11-14 DIAGNOSIS — C50412 Malignant neoplasm of upper-outer quadrant of left female breast: Secondary | ICD-10-CM | POA: Diagnosis not present

## 2019-11-15 ENCOUNTER — Other Ambulatory Visit: Payer: Self-pay

## 2019-11-15 ENCOUNTER — Ambulatory Visit
Admission: RE | Admit: 2019-11-15 | Discharge: 2019-11-15 | Disposition: A | Discharge status: Home / Self Care | Payer: Medicare PPO | Source: Ambulatory Visit | Attending: Radiation Oncology | Admitting: Radiation Oncology

## 2019-11-15 DIAGNOSIS — C50412 Malignant neoplasm of upper-outer quadrant of left female breast: Secondary | ICD-10-CM | POA: Diagnosis not present

## 2019-11-16 ENCOUNTER — Other Ambulatory Visit: Payer: Self-pay

## 2019-11-16 ENCOUNTER — Ambulatory Visit
Admission: RE | Admit: 2019-11-16 | Discharge: 2019-11-16 | Disposition: A | Discharge status: Home / Self Care | Payer: Medicare PPO | Source: Ambulatory Visit | Attending: Radiation Oncology | Admitting: Radiation Oncology

## 2019-11-16 DIAGNOSIS — C50412 Malignant neoplasm of upper-outer quadrant of left female breast: Secondary | ICD-10-CM | POA: Diagnosis not present

## 2019-11-17 ENCOUNTER — Encounter: Payer: Self-pay | Admitting: *Deleted

## 2019-11-17 ENCOUNTER — Ambulatory Visit
Admission: RE | Admit: 2019-11-17 | Discharge: 2019-11-17 | Disposition: A | Discharge status: Home / Self Care | Payer: Medicare PPO | Source: Ambulatory Visit | Attending: Radiation Oncology | Admitting: Radiation Oncology

## 2019-11-17 ENCOUNTER — Other Ambulatory Visit: Payer: Self-pay

## 2019-11-17 DIAGNOSIS — C50412 Malignant neoplasm of upper-outer quadrant of left female breast: Secondary | ICD-10-CM | POA: Diagnosis not present

## 2019-11-18 ENCOUNTER — Encounter: Payer: Self-pay | Admitting: Radiation Oncology

## 2019-11-18 ENCOUNTER — Ambulatory Visit
Admission: RE | Admit: 2019-11-18 | Discharge: 2019-11-18 | Disposition: A | Discharge status: Home / Self Care | Payer: Medicare PPO | Source: Ambulatory Visit | Attending: Radiation Oncology | Admitting: Radiation Oncology

## 2019-11-18 ENCOUNTER — Other Ambulatory Visit: Payer: Self-pay

## 2019-11-18 DIAGNOSIS — C50412 Malignant neoplasm of upper-outer quadrant of left female breast: Secondary | ICD-10-CM | POA: Diagnosis not present

## 2019-11-20 NOTE — Progress Notes (Signed)
[place]  [phone] [phone]     ID: Susan Randolph DOB: 1945-12-29  MR#: [id number]  NTZ#:[id number]  Patient Care Team: Josetta Huddle, MD as PCP - General (Internal Medicine) Mauro Kaufmann, RN as Oncology Nurse Navigator Rockwell Germany, RN as Oncology Nurse Navigator Alphonsa Overall, MD as Consulting Physician (General Surgery) , Virgie Dad, MD as Consulting Physician (Oncology) Eppie Gibson, MD as Attending Physician (Radiation Oncology) Gaynelle Arabian, MD as Consulting Physician (Orthopedic Surgery) Earnie Larsson, MD as Consulting Physician (Neurosurgery) Christophe Louis, MD as Consulting Physician (Obstetrics and Gynecology) Clarene Essex, MD as Consulting Physician (Gastroenterology) Chauncey Cruel, MD OTHER MD:  CHIEF COMPLAINT: estrogen receptor positive breast cancer  CURRENT TREATMENT: To start anastrozole   INTERVAL HISTORY: Susan Randolph return today for follow up of her estrogen receptor positive breast cancer.  Since her last visit, she underwent bone density screening on 08/25/2019. This showed a T-score of -1.8, which is considered osteopenic.  She opted to proceed with left lumpectomy and sentinel lymph node biopsy on 08/31/2019 under Dr. Lucia Gaskins. Pathology from the procedure [id number]) showed: invasive ductal carcinoma, grade 2, 0.4 cm; ductal carcinoma in situ, intermediate grade; margins uninvolved.  Two left axillary lymph nodes were biopsied, both of which were negative for carcinoma (0/2).  She was then referred back to Dr. Isidore Moos to discuss radiation therapy. She received treatment from 10/24/2019 through 11/18/2019.   REVIEW OF SYSTEMS: Susan Randolph did well with her surgery and was getting all set up for radiation when she developed COVID-19.  So did her 73 year old mother and her husband and now her grandson who is in the [employer].  Susan Randolph and her relatives survived but she really had a rough time with high fevers a lot  of cough and loss of taste and smell, which still have not come back.  She is having knee problems and has seen Dr. Reynaldo Minium for this.  She has had 3 "shots" in her knee but it has not improved yet.  For exercise she is thinking of doing a stationary bike.  Aside from these issues a detailed review of systems today was stable   HISTORY OF CURRENT ILLNESS: From the original intake note:  Susan Randolph had routine screening mammography on 08/02/2019 showing a possible abnormality in the left breast. She underwent left diagnostic mammography with tomography and left breast ultrasonography at [facility] on 08/08/2019 showing: breast density category B; 6 mm mass in the left breast at 12 o'clock; no enlarged or abnormal left axillary lymph nodes.  Accordingly on 08/09/2019 she proceeded to biopsy of the left breast area in question. The pathology from this procedure ([id number]) showed: invasive mammary carcinoma, grade 2, e-cadherin positive. Prognostic indicators significant for: estrogen receptor, 100% positive and progesterone receptor, 95% positive, both with strong staining intensity. Proliferation marker Ki67 at 2%. HER2 equivocal by immunohistochemistry (2+), but negative by fluorescent in situ hybridization with a signals ratio 1.19 and number per cell 1.85.  The patient's subsequent history is as detailed below.   PAST MEDICAL HISTORY: Past Medical History:  Diagnosis Date  . Anxiety   . Arthritis   . Breast cancer ([place]) 06/2019   left IMC  . Cancer ([facility])    basal cell carcinoma  . GERD (gastroesophageal reflux disease)   . Headache   . Hypertension   . Rectal bleed 09/2016    PAST SURGICAL HISTORY: Past Surgical History:  Procedure Laterality Date  . BREAST LUMPECTOMY WITH RADIOACTIVE SEED AND  SENTINEL LYMPH NODE BIOPSY Left 08/31/2019   Procedure: LEFT BREAST LUMPECTOMY WITH RADIOACTIVE SEED AND LEFT AXILLARY SENTINEL LYMPH NODE BIOPSY;  Surgeon: Alphonsa Overall, MD;   Location: [facility];  Service: General;  Laterality: Left;  . EYE SURGERY     Corrected drooping eye lids bilateral  . MENISCUS REPAIR Right 03/29/2019  . POSTERIOR CERVICAL FUSION/FORAMINOTOMY N/A 01/11/2018   Procedure: Posterior Cervical Fusion with lateral mass fixation - Cervical one-Cervical two with Iliac Crest bone graft;  Surgeon: Earnie Larsson, MD;  Location: [facility];  Service: Neurosurgery;  Laterality: N/A;  . TUBAL LIGATION      FAMILY HISTORY: Family History  Problem Relation Age of Onset  . Cancer Mother        breast cancer/endometrial cancer  . Hypertension Mother   . Breast cancer Mother   . Hypertension Father   . Cancer Father        Glioblastoma  . Arthritis Father   . Rheum arthritis Maternal Uncle   . Colon cancer Maternal Uncle    Patient's father was 51 years old when he died from [place]. Patient's mother is 44 years old as of 07/2019.  She was diagnosed with breast cancer at age 43 and with endometrial cancer at age 82. The patient denies a family hx of ovarian cancer. She has no siblings. She also notes a maternal uncle with colon cancer in his 70s.   GYNECOLOGIC HISTORY:  No LMP recorded. Patient is postmenopausal. Menarche: 74 years old Age at first live birth: 74 years old [place] P 3 LMP age 71 Contraceptive yes, unsure how long, no issues HRT yes, used for 5 years  Hysterectomy? no BSO? no   SOCIAL HISTORY: (updated 07/2019)  Susan Randolph is currently retired from working as a [employer].  Husband Susan Randolph") is a retired Clinical biochemist. She lives at home with Susan Randolph. Daughter Susan Randolph, age 50, is a homemaker in  [place]. Daughter Susan Randolph, age 82, is a Marine scientist at the [employer] hospital in [facility]. The patient lost her son Susan Randolph to a car accident in 2015.  The patient has 3 grandchildren.  She attends [employer].    ADVANCED DIRECTIVES: In the absence of any documentation to the contrary, the patient's  spouse is her 29.   HEALTH MAINTENANCE: Social History   Tobacco Use  . Smoking status: Never Smoker  . Smokeless tobacco: Never Used  Substance Use Topics  . Alcohol use: Yes    Comment: social  . Drug use: No     Colonoscopy: 2014  PAP: 03/2017, negative  Bone density: scheduled 08/2019   Allergies  Allergen Reactions  . Sulfa Antibiotics Other (See Comments)    Doesn't remember, happened in childhood  . Amitriptyline Other (See Comments)    tremors    Current Outpatient Medications  Medication Sig Dispense Refill  . acetaminophen (TYLENOL) 500 MG tablet Take 500 mg by mouth 2 (two) times daily as needed for mild pain.     Marland Kitchen ALPRAZolam (XANAX) 0.5 MG tablet Take 0.25-0.5 mg by mouth 3 (three) times daily as needed for anxiety.    . bimatoprost (LUMIGAN) 0.01 % SOLN Place 1 drop into both eyes at bedtime.     . Cholecalciferol (VITAMIN D3) 2000 units capsule Take 2,000 Units by mouth daily.     . fluticasone (FLONASE) 50 MCG/ACT nasal spray Place 2 sprays into both nostrils daily as needed for allergies or rhinitis.    Marland Kitchen  Guaifenesin (MUCINEX MAXIMUM STRENGTH) 1200 MG TB12 Take 2 tablets by mouth 2 (two) times daily as needed (cold symptoms).    Marland Kitchen HYDROcodone-acetaminophen (NORCO/VICODIN) 5-325 MG tablet Take 1 tablet by mouth every 6 (six) hours as needed for moderate pain. (Patient not taking: Reported on 09/21/2019) 10 tablet 0  . lisinopril-hydrochlorothiazide (PRINZIDE,ZESTORETIC) 10-12.5 MG tablet Take 1 tablet by mouth daily.  9  . loratadine (CLARITIN) 10 MG tablet Take 10 mg by mouth daily.    . Melatonin 10 MG CAPS Take by mouth.    . meloxicam (MOBIC) 7.5 MG tablet Take 7.5 mg by mouth daily.    . methocarbamol (ROBAXIN) 500 MG tablet TAKE 1 TABLET BY MOUTH THREE TIMES A DAY AS NEEDED FOR MUSCLE SPASMS    . Multiple Vitamins-Minerals (ALIVE WOMENS 50+) TABS Take 1 tablet by mouth daily.    Marland Kitchen omeprazole (PRILOSEC) 20 MG capsule Take 20 mg by mouth daily.    .  traZODone (DESYREL) 50 MG tablet Take 50 mg by mouth at bedtime.     No current facility-administered medications for this visit.    OBJECTIVE: Middle-aged white woman who appears younger than stated age  There were no vitals filed for this visit.   There is no height or weight on file to calculate BMI.   Wt Readings from Last 3 Encounters:  09/21/19 142 lb 12.8 oz (64.8 kg)  08/31/19 142 lb 3.2 oz (64.5 kg)  08/17/19 142 lb 3.2 oz (64.5 kg)      ECOG FS:1 - Symptomatic but completely ambulatory  Sclerae unicteric, EOMs intact Wearing a mask No cervical or supraclavicular adenopathy Lungs no rales or rhonchi Heart regular rate and rhythm Abd soft, nontender, positive bowel sounds MSK no focal spinal tenderness, no upper extremity lymphedema Neuro: nonfocal, well oriented, appropriate affect Breasts: The right breast is unremarkable.  The left breast is status post lumpectomy and radiation.  The cosmetic result is excellent.  The skin is inflamed but there is no desquamation.  Both axillae are benign.   LAB RESULTS:  CMP     Component Value Date/Time   NA 140 08/17/2019 0846   K 3.8 08/17/2019 0846   CL 103 08/17/2019 0846   CO2 26 08/17/2019 0846   GLUCOSE 99 08/17/2019 0846   BUN 22 08/17/2019 0846   CREATININE 0.83 08/17/2019 0846   CALCIUM 10.5 (H) 08/17/2019 0846   PROT 7.2 08/17/2019 0846   ALBUMIN 4.5 08/17/2019 0846   AST 16 08/17/2019 0846   ALT 17 08/17/2019 0846   ALKPHOS 64 08/17/2019 0846   BILITOT 0.4 08/17/2019 0846   GFRNONAA >60 08/17/2019 0846   GFRAA >60 08/17/2019 0846    No results found for: TOTALPROTELP, ALBUMINELP, A1GS, A2GS, BETS, BETA2SER, GAMS, MSPIKE, SPEI  No results found for: KPAFRELGTCHN, LAMBDASER, [facility]  Lab Results  Component Value Date   WBC 4.6 08/17/2019   NEUTROABS 2.8 08/17/2019   HGB 12.9 08/17/2019   HCT 39.1 08/17/2019   MCV 90.1 08/17/2019   PLT 204 08/17/2019   No results found for: LABCA2  No  components found for: [id number]  No results for input(s): INR in the last 168 hours.  No results found for: LABCA2  No results found for: LKT625  No results found for: WLS937  No results found for: DSK876  No results found for: CA2729  No components found for: HGQUANT  No results found for: CEA1 / No results found for: CEA1   No results found for: [facility]  No results found for: CHROMOGRNA  No results found for: HGBA, HGBA2QUANT, HGBFQUANT, HGBSQUAN (Hemoglobinopathy evaluation)   No results found for: LDH  No results found for: IRON, TIBC, IRONPCTSAT (Iron and TIBC)  No results found for: FERRITIN  Urinalysis No results found for: COLORURINE, APPEARANCEUR, LABSPEC, PHURINE, GLUCOSEU, HGBUR, BILIRUBINUR, KETONESUR, PROTEINUR, UROBILINOGEN, NITRITE, LEUKOCYTESUR   STUDIES: No results found.   ELIGIBLE FOR AVAILABLE RESEARCH PROTOCOL: no  ASSESSMENT: 74 y.o. [place] woman status post left breast upper outer quadrant biopsy 08/09/2019 for a clinical T1b N0, stage IA invasive ductal carcinoma, grade 2, estrogen and progesterone receptor positive, HER-2 not amplified, with an MIB-1 of 2%.  (1) status post left lumpectomy and sentinel lymph node sampling 08/31/2019 for a pT1a pN0, stage IA invasive ductal carcinoma, with negative margins  (2) adjuvant radiation 10/24/2019-11/18/2019  (3) to start anastrozole 01/08/2020  (a) bone density 08/25/2019 TEE shows a T score of -1.8 ([facility])  PLAN: Susan Randolph has had a rough several months with her diagnosis of breast cancer, having to go through surgery and radiation, problems with her knees, and then Covid on top of everything else.    She has gone through all of that amazingly well.  I expect her skin will look much better in just another week.  I do think the fatigue from radiation lasts 4 to 8 weeks so it is going to be mid March before she is having a little bit more energy.  She understands that her breast cancer  was very small.  We never use chemotherapy in these very small node-negative tumors.  Antiestrogens are optional and we she does understand that in terms of benefit antiestrogens for 5 years would not only cut the risk of local recurrence in half, which is already very low, but also cut the risk of a new breast cancer developing in half.  That risk otherwise approaches 1 %/year and note that her mother is still alive at 41 and in fact survived Covid 54 herself  We then discussed the difference between tamoxifen and anastrozole in detail. She understands that anastrozole and the aromatase inhibitors in general work by blocking estrogen production. Accordingly vaginal dryness, decrease in bone density, and of course hot flashes can result. The aromatase inhibitors can also negatively affect the cholesterol profile, although that is a minor effect. One out of 5 women on aromatase inhibitors we will feel "old and achy". This arthralgia/myalgia syndrome, which resembles fibromyalgia clinically, does resolve with stopping the medications. Accordingly this is not a reason to not try an aromatase inhibitor but it is a frequent reason to stop it (in other words 20% of women will not be able to tolerate these medications).  Tamoxifen on the other hand does not block estrogen production. It does not "take away a woman's estrogen". It blocks the estrogen receptor in breast cells. Like anastrozole, it can also cause hot flashes. As opposed to anastrozole, tamoxifen has many estrogen-like effects. It is technically an estrogen receptor modulator. This means that in some tissues tamoxifen works like estrogen-- for example it helps strengthen the bones. It tends to improve the cholesterol profile. It can cause thickening of the endometrial lining, and even endometrial polyps or rarely cancer of the uterus.(The risk of uterine cancer due to tamoxifen is one additional cancer per thousand women year). It can cause vaginal wetness  or stickiness. It can cause blood clots through this estrogen-like effect--the risk of blood clots with tamoxifen is exactly the same as with birth  control pills or hormone replacement.  Neither of these agents causes mood changes or weight gain, despite the popular belief that they can have these side effects. We have data from studies comparing either of these drugs with placebo, and in those cases the control group had the same amount of weight gain and depression as the group that took the drug.  Susan Randolph is not in favor of tamoxifen because her mother did take it and then 15 to 20 years later developed endometrial cancer.  We are accordingly going to proceed with anastrozole.  She will started on the first day of spring which also happens to be her granddaughter's birthday and then I will see her again 3 months later.  If all is going well the plan will be to do that for total of 5 years.  Otherwise we will consider a different aromatase inhibitor or observation alone.  Total encounter time 35 minutes.Chauncey Cruel, MD   11/21/2019 1:23 PM Medical Oncology and Hematology [facility] [place], [place] [address] Tel. [phone]    Fax. [phone]   This document serves as a record of services personally performed by Lurline Del, MD. It was created on his behalf by Wilburn Mylar, a trained medical scribe. The creation of this record is based on the scribe's personal observations and the provider's statements to them.   I, Lurline Del MD, have reviewed the above documentation for accuracy and completeness, and I agree with the above.   *Total Encounter Time as defined by the Centers for Medicare and Medicaid Services includes, in addition to the face-to-face time of a patient visit (documented in the note above) non-face-to-face time: obtaining and reviewing outside history, ordering and reviewing medications, tests or procedures, care coordination  (communications with other health care professionals or caregivers) and documentation in the medical record.

## 2019-11-21 ENCOUNTER — Inpatient Hospital Stay: Payer: Medicare PPO | Attending: Oncology | Admitting: Oncology

## 2019-11-21 ENCOUNTER — Other Ambulatory Visit: Payer: Self-pay

## 2019-11-21 ENCOUNTER — Ambulatory Visit: Payer: Medicare PPO | Attending: Radiation Oncology

## 2019-11-21 VITALS — BP 125/71 | HR 69 | Temp 98.7°F | Resp 18 | Ht 64.0 in | Wt 140.1 lb

## 2019-11-21 DIAGNOSIS — G2581 Restless legs syndrome: Secondary | ICD-10-CM

## 2019-11-21 DIAGNOSIS — K219 Gastro-esophageal reflux disease without esophagitis: Secondary | ICD-10-CM

## 2019-11-21 DIAGNOSIS — M2550 Pain in unspecified joint: Secondary | ICD-10-CM | POA: Diagnosis not present

## 2019-11-21 DIAGNOSIS — Z170 Estrogen receptor positive status [ER+]: Secondary | ICD-10-CM | POA: Insufficient documentation

## 2019-11-21 DIAGNOSIS — Z8616 Personal history of COVID-19: Secondary | ICD-10-CM | POA: Insufficient documentation

## 2019-11-21 DIAGNOSIS — Z79811 Long term (current) use of aromatase inhibitors: Secondary | ICD-10-CM | POA: Insufficient documentation

## 2019-11-21 DIAGNOSIS — M47816 Spondylosis without myelopathy or radiculopathy, lumbar region: Secondary | ICD-10-CM | POA: Diagnosis not present

## 2019-11-21 DIAGNOSIS — C50412 Malignant neoplasm of upper-outer quadrant of left female breast: Secondary | ICD-10-CM | POA: Insufficient documentation

## 2019-11-21 DIAGNOSIS — Z923 Personal history of irradiation: Secondary | ICD-10-CM | POA: Insufficient documentation

## 2019-11-21 DIAGNOSIS — I10 Essential (primary) hypertension: Secondary | ICD-10-CM

## 2019-11-21 DIAGNOSIS — Z8639 Personal history of other endocrine, nutritional and metabolic disease: Secondary | ICD-10-CM

## 2019-11-21 MED ORDER — ANASTROZOLE 1 MG PO TABS: 1.0000 mg | ORAL_TABLET | Freq: Every day | ORAL | 4 refills | Status: DC

## 2019-11-21 NOTE — Addendum Note (Signed)
Addended by: Chauncey Cruel on: 11/21/2019 02:04 PM   Modules accepted: Orders

## 2019-11-21 NOTE — Progress Notes (Signed)
  Patient Name: Susan Randolph MRN: [id number] DOB: 14-Jul-1946 Referring Physician: Josetta Huddle (Profile Not Attached) Date of Service: 11/18/2019 [facility], [place]                                                        End Of Treatment Note  Diagnoses: C50.412-Malignant neoplasm of upper-outer quadrant of left female breast  Cancer Staging: Cancer Staging Malignant neoplasm of upper-outer quadrant of left breast in female, estrogen receptor positive ([place]) Staging form: Breast, AJCC 8th Edition - Clinical stage from 08/17/2019: Stage IA (cT1b, cN0, cM0, G2, ER+, PR+, HER2-) - Unsigned pT1a pN0 cM0   Intent: Curative  Radiation Treatment Dates: 10/24/2019 through 11/18/2019 Site Technique Total Dose (Gy) Dose per Fx (Gy) Completed Fx Beam Energies  Breast, Left: Breast_Lt 3D 40.05/40.05 2.67 15/15 6X  Breast, Left: Breast_Lt_Bst 3D 10/10 2 5/5 6X   Narrative: She tolerated radiation therapy relatively well. The start of her treatment was delayed due to her having COVID-19.  Plan: She will follow-up with radiation oncology in 47mo or as needed . -----------------------------------  SEppie Gibson MD

## 2019-11-22 ENCOUNTER — Telehealth: Payer: Self-pay | Admitting: Oncology

## 2019-11-22 NOTE — Telephone Encounter (Signed)
Patient saw the appts on my chart

## 2019-11-28 ENCOUNTER — Ambulatory Visit: Payer: Medicare PPO | Attending: Internal Medicine

## 2019-11-28 DIAGNOSIS — Z23 Encounter for immunization: Secondary | ICD-10-CM

## 2019-11-28 NOTE — Progress Notes (Signed)
   Covid-19 Vaccination Clinic  Name:  TRULEE BATES    MRN: [id number] DOB: 1946/02/15  11/28/2019  Ms. Buendia was observed post Covid-19 immunization for 15 minutes without incidence. She was provided with Vaccine Information Sheet and instruction to access the V-Safe system.   Ms. Teller was instructed to call 911 with any severe reactions post vaccine: Marland Kitchen Difficulty breathing  . Swelling of your face and throat  . A fast heartbeat  . A bad rash all over your body  . Dizziness and weakness    Immunizations Administered    Name Date Dose VIS Date Route   Pfizer COVID-19 Vaccine 11/28/2019  2:50 PM 0.3 mL 09/30/2019 Intramuscular   Manufacturer: [place]   Lot: [id number]   [place]: [id number]

## 2019-12-07 ENCOUNTER — Ambulatory Visit
Admission: RE | Admit: 2019-12-07 | Discharge: 2019-12-07 | Disposition: A | Discharge status: Home / Self Care | Payer: Medicare PPO | Source: Ambulatory Visit | Attending: Internal Medicine | Admitting: Internal Medicine

## 2019-12-07 ENCOUNTER — Other Ambulatory Visit: Payer: Self-pay | Admitting: Internal Medicine

## 2019-12-07 DIAGNOSIS — R0789 Other chest pain: Secondary | ICD-10-CM

## 2019-12-14 ENCOUNTER — Encounter: Payer: Self-pay | Admitting: Radiation Oncology

## 2019-12-14 NOTE — Progress Notes (Signed)
I called the patient today about her upcoming follow-up appointment in radiation oncology.   Given the state of the COVID-19 pandemic, concerning case numbers in our community, and guidance from [facility], I offered a phone assessment with the patient to determine if coming to the clinic was necessary. She accepted.  I let the patient know that I had spoken with Dr. Isidore Moos, and she wanted them to know the importance of washing their hands for at least 20 seconds at a time, especially after going out in public, and before they eat.  Limit going out in public whenever possible. Do not touch your face, unless your hands are clean, such as when bathing. Get plenty of rest, eat well, and stay hydrated.   The patient denies any symptomatic concerns.  Specifically, they report good healing of their skin in the radiation fields.  Skin is intact.    I recommended that she continue skin care by applying oil or lotion with vitamin E to the skin in the radiation fields, BID, for 2 more months.  Continue follow-up with medical oncology - follow-up is scheduled on 02/27/20 with Wilber Bihari NP .  I explained that yearly mammograms are important for patients with intact breast tissue, and physical exams are important after mastectomy for patients that cannot undergo mammography.  I encouraged her to call if she had further questions or concerns about her healing. Otherwise, she will follow-up PRN in radiation oncology. Patient is pleased with this plan, and we will cancel her upcoming follow-up to reduce the risk of COVID-19 transmission.

## 2019-12-16 ENCOUNTER — Ambulatory Visit
Admission: RE | Admit: 2019-12-16 | Discharge: 2019-12-16 | Disposition: A | Discharge status: Home / Self Care | Payer: Medicare PPO | Source: Ambulatory Visit | Attending: Radiation Oncology | Admitting: Radiation Oncology

## 2019-12-16 HISTORY — DX: Personal history of irradiation: Z92.3

## 2019-12-23 ENCOUNTER — Ambulatory Visit: Payer: Medicare PPO | Attending: Internal Medicine

## 2019-12-23 ENCOUNTER — Other Ambulatory Visit: Payer: Self-pay

## 2019-12-23 DIAGNOSIS — Z23 Encounter for immunization: Secondary | ICD-10-CM | POA: Insufficient documentation

## 2019-12-23 NOTE — Progress Notes (Signed)
   Covid-19 Vaccination Clinic  Name:  Susan Randolph    MRN: [id number] DOB: [birth year]  12/23/2019  [name] was observed post Covid-19 immunization for 15 minutes without incident. She was provided with Vaccine Information Sheet and instruction to access the V-Safe system.   [name] was instructed to call 911 with any severe reactions post vaccine: Marland Kitchen Difficulty breathing  . Swelling of face and throat  . A fast heartbeat  . A bad rash all over body  . Dizziness and weakness   Immunizations Administered    Name Date Dose VIS Date Route   Pfizer COVID-19 Vaccine 12/23/2019 10:23 AM 0.3 mL 09/30/2019 Intramuscular   Manufacturer: [place]   Lot: [id number]   [place]: [id number]

## 2020-02-08 DIAGNOSIS — M47812 Spondylosis without myelopathy or radiculopathy, cervical region: Secondary | ICD-10-CM | POA: Diagnosis not present

## 2020-02-08 DIAGNOSIS — S29012A Strain of muscle and tendon of back wall of thorax, initial encounter: Secondary | ICD-10-CM | POA: Diagnosis not present

## 2020-02-08 DIAGNOSIS — M9902 Segmental and somatic dysfunction of thoracic region: Secondary | ICD-10-CM | POA: Diagnosis not present

## 2020-02-08 DIAGNOSIS — M9901 Segmental and somatic dysfunction of cervical region: Secondary | ICD-10-CM | POA: Diagnosis not present

## 2020-02-08 DIAGNOSIS — M9903 Segmental and somatic dysfunction of lumbar region: Secondary | ICD-10-CM | POA: Diagnosis not present

## 2020-02-08 DIAGNOSIS — S39012A Strain of muscle, fascia and tendon of lower back, initial encounter: Secondary | ICD-10-CM | POA: Diagnosis not present

## 2020-02-24 ENCOUNTER — Telehealth: Payer: Self-pay | Admitting: Adult Health

## 2020-02-24 NOTE — Telephone Encounter (Signed)
Rescheduled appt per provider template change. Pt's voicemail box was full.

## 2020-02-27 ENCOUNTER — Inpatient Hospital Stay: Payer: Medicare PPO | Admitting: Adult Health

## 2020-02-28 ENCOUNTER — Inpatient Hospital Stay: Payer: Medicare PPO | Attending: Oncology | Admitting: Adult Health

## 2020-02-28 ENCOUNTER — Encounter: Payer: Self-pay | Admitting: Adult Health

## 2020-02-28 ENCOUNTER — Other Ambulatory Visit: Payer: Self-pay

## 2020-02-28 DIAGNOSIS — C50412 Malignant neoplasm of upper-outer quadrant of left female breast: Secondary | ICD-10-CM | POA: Diagnosis not present

## 2020-02-28 DIAGNOSIS — Z170 Estrogen receptor positive status [ER+]: Secondary | ICD-10-CM | POA: Diagnosis not present

## 2020-02-28 NOTE — Progress Notes (Signed)
SURVIVORSHIP VIRTUAL VISIT:  I connected with Susan Randolph on 02/28/20 at 12:00 PM EDT by telephone and verified that I am speaking with the correct person using two identifiers.  I discussed the limitations, risks, security and privacy concerns of performing an evaluation and management service by telephone and the availability of in person appointments. I also discussed with the patient that there may be a patient responsible charge related to this service. The patient expressed understanding and agreed to proceed.  Patient location: home Provider location: [facility] office  BRIEF ONCOLOGIC HISTORY:  Oncology History  Malignant neoplasm of upper-outer quadrant of left breast in female, estrogen receptor positive ([place])  08/12/2019 Initial Diagnosis   Malignant neoplasm of upper-outer quadrant of left breast in female, estrogen receptor positive ([place])   08/31/2019 Surgery   Left lumpectomy Lucia Gaskins) [phone]): pT1a pN0, stage IA invasive ductal carcinoma, with negative margins. 2 lymph nodes negative for carcinoma.   08/31/2019 Cancer Staging   Staging form: Breast, AJCC 8th Edition - Pathologic stage from 08/31/2019: Stage IA (pT1a, pN0, cM0, G2, ER+, PR+, HER2-)    10/24/2019 - 11/18/2019 Radiation Therapy   The patient initially received a dose of 40.05 Gy in 15 fractions to the breast using whole-breast tangent fields. This was delivered using a 3-D conformal technique. The pt received a boost delivering an additional 10 Gy in 5 fractions using a electron boost with 80mV electrons. The total dose was 50.05 Gy.   12/2019 - 12/2024 Anti-estrogen oral therapy   Anastrozole     INTERVAL HISTORY:  [name] review her survivorship care plan detailing her treatment course for breast cancer, as well as monitoring long-term side effects of that treatment, education regarding health maintenance, screening, and overall wellness and health promotion.     Overall, [name] reports feeling  quite well.  She completed the survivorship survey which noted she is slightly stressed with taking care of her 912year old mother.  She doe shave some memory changes that she attributes to her h/o COVID19.  She notes that her breasts are still sore from her treatment, but improved.  She is taking anastrozole daily.  She has some hot flashes but these are manageable.  She denies any increased vaginal dryness or arthralgias.  She does have some issues with her knees, and has been told that she needs a knee replacement.  This is preventing her from exercising.    REVIEW OF SYSTEMS:  Review of Systems  Constitutional: Negative for appetite change, chills, fatigue, fever and unexpected weight change.  HENT:   Negative for hearing loss, lump/mass and sore throat.   Eyes: Negative for eye problems and icterus.  Respiratory: Negative for chest tightness, cough and shortness of breath.   Cardiovascular: Negative for chest pain, leg swelling and palpitations.  Endocrine: Positive for hot flashes.  Musculoskeletal: Positive for arthralgias.  Skin: Negative for itching and rash.  Neurological: Negative for dizziness, extremity weakness and headaches.  Hematological: Negative for adenopathy. Does not bruise/bleed easily.  Psychiatric/Behavioral: Positive for sleep disturbance.   Breast: Denies any new nodularity, masses, tenderness, nipple changes, or nipple discharge.      ONCOLOGY TREATMENT TEAM:  1. Surgeon:  [facility] 2. Medical Oncologist: Dr. MJana Hakim 3. Radiation Oncologist: Dr. SIsidore Moos   PAST MEDICAL/SURGICAL HISTORY:  Past Medical History:  Diagnosis Date  . Anxiety   . Arthritis   . Breast cancer ([facility] 06/2019   left IMC  . [place]  basal cell carcinoma  . GERD (gastroesophageal reflux disease)   . Headache   . History of radiation therapy 10/24/19- 11/18/19   Left Breast 15 fractions of 2.67 Gy to total 40.05 Gy. Left breast boost 5 fractions of  2 Gy to total 10 Gy  . Hypertension   . Rectal bleed 09/2016   Past Surgical History:  Procedure Laterality Date  . BREAST LUMPECTOMY WITH RADIOACTIVE SEED AND SENTINEL LYMPH NODE BIOPSY Left 08/31/2019   Procedure: LEFT BREAST LUMPECTOMY WITH RADIOACTIVE SEED AND LEFT AXILLARY SENTINEL LYMPH NODE BIOPSY;  Surgeon: Alphonsa Overall, MD;  Location: [facility];  Service: General;  Laterality: Left;  . EYE SURGERY     Corrected drooping eye lids bilateral  . MENISCUS REPAIR Right 03/29/2019  . POSTERIOR CERVICAL FUSION/FORAMINOTOMY N/A 01/11/2018   Procedure: Posterior Cervical Fusion with lateral mass fixation - Cervical one-Cervical two with Iliac Crest bone graft;  Surgeon: Earnie Larsson, MD;  Location: [facility];  Service: Neurosurgery;  Laterality: N/A;  . TUBAL LIGATION       ALLERGIES:  Allergies  Allergen Reactions  . Sulfa Antibiotics Other (See Comments)    Doesn't remember, happened in childhood  . Amitriptyline Other (See Comments)    tremors     CURRENT MEDICATIONS:  Outpatient Encounter Medications as of 02/28/2020  Medication Sig Note  . acetaminophen (TYLENOL) 500 MG tablet Take 500 mg by mouth 2 (two) times daily as needed for mild pain.  01/01/2017: Patient was advised to avoid taking more than 3000 mg daily.    Marland Kitchen ALPRAZolam (XANAX) 0.5 MG tablet Take 0.25-0.5 mg by mouth 3 (three) times daily as needed for anxiety. 01/01/2017: Very seldom takes. Takes 1/2 tablet approximately every 6 months.   Marland Kitchen anastrozole (ARIMIDEX) 1 MG tablet Take 1 tablet (1 mg total) by mouth daily.   . bimatoprost (LUMIGAN) 0.01 % SOLN Place 1 drop into both eyes at bedtime.    . Cholecalciferol (VITAMIN D3) 2000 units capsule Take 2,000 Units by mouth daily.    . fluticasone (FLONASE) 50 MCG/ACT nasal spray Place 2 sprays into both nostrils daily as needed for allergies or rhinitis.   . Guaifenesin (MUCINEX MAXIMUM STRENGTH) 1200 MG TB12 Take 2 tablets by mouth 2 (two) times daily as  needed (cold symptoms).   Marland Kitchen HYDROcodone-acetaminophen (NORCO/VICODIN) 5-325 MG tablet Take 1 tablet by mouth every 6 (six) hours as needed for moderate pain. (Patient not taking: Reported on 09/21/2019)   . lisinopril-hydrochlorothiazide (PRINZIDE,ZESTORETIC) 10-12.5 MG tablet Take 1 tablet by mouth daily.   Marland Kitchen loratadine (CLARITIN) 10 MG tablet Take 10 mg by mouth daily.   . Melatonin 10 MG CAPS Take by mouth.   . meloxicam (MOBIC) 7.5 MG tablet Take 7.5 mg by mouth daily.   . methocarbamol (ROBAXIN) 500 MG tablet TAKE 1 TABLET BY MOUTH THREE TIMES A DAY AS NEEDED FOR MUSCLE SPASMS   . Multiple Vitamins-Minerals (ALIVE WOMENS 50+) TABS Take 1 tablet by mouth daily.   Marland Kitchen omeprazole (PRILOSEC) 20 MG capsule Take 20 mg by mouth daily.   . traZODone (DESYREL) 50 MG tablet Take 50 mg by mouth at bedtime.    No facility-administered encounter medications on file as of 02/28/2020.     ONCOLOGIC FAMILY HISTORY:  Family History  Problem Relation Age of Onset  . Cancer Mother        breast cancer/endometrial cancer  . Hypertension Mother   . Breast cancer Mother   . Hypertension Father   .  Cancer Father        Glioblastoma  . Arthritis Father   . Rheum arthritis Maternal Uncle   . Colon cancer Maternal Uncle      GENETIC COUNSELING/TESTING: Not at this time  SOCIAL HISTORY:  Social History   Socioeconomic History  . Marital status: Married    Spouse name: Not on file  . Number of children: Not on file  . Years of education: Not on file  . Highest education level: Not on file  Occupational History  . Not on file  Tobacco Use  . Smoking status: Never Smoker  . Smokeless tobacco: Never Used  Substance and Sexual Activity  . Alcohol use: Yes    Comment: social  . Drug use: No  . Sexual activity: Not on file  Other Topics Concern  . Not on file  Social History Narrative  . Not on file   Social Determinants of Health   Financial Resource Strain:   . Difficulty of Paying  Living Expenses:   Food Insecurity:   . Worried About Charity fundraiser in the Last Year:   . Arboriculturist in the Last Year:   Transportation Needs: No Transportation Needs  . Lack of Transportation (Medical): No  . Lack of Transportation (Non-Medical): No  Physical Activity:   . Days of Exercise per Week:   . Minutes of Exercise per Session:   Stress:   . Feeling of Stress :   Social Connections:   . Frequency of Communication with Friends and Family:   . Frequency of Social Gatherings with Friends and Family:   . Attends Religious Services:   . Active Member of Clubs or Organizations:   . Attends Archivist Meetings:   Marland Kitchen Marital Status:   Intimate Partner Violence: Not At Risk  . Fear of Current or Ex-Partner: No  . Emotionally Abused: No  . Physically Abused: No  . Sexually Abused: No     OBSERVATIONS/OBJECTIVE:  Sounds well.  She is in no apparent distress, mood and behavior are normal.  Speech is normal.  Breathing is non labored.   LABORATORY DATA:  None for this visit.  DIAGNOSTIC IMAGING:  None for this visit.      ASSESSMENT AND PLAN:  Ms.. Susan Randolph is a pleasant 74 y.o. female with Stage IA left breast invasive ductal carcinoma, ER+/PR+/HER2-, diagnosed in 07/2019, treated with lumpectomy, adjuvant radiation therapy, and anti-estrogen therapy with Anastrozole beginning in 12/2019.  She presents to the Survivorship Clinic for our initial meeting and routine follow-up post-completion of treatment for breast cancer.    1. Stage IA left breast cancer:  [name] is continuing to recover from definitive treatment for breast cancer. She will follow-up with her medical oncologist, Dr. Jana Hakim in 03/2020 with history and physical exam per surveillance protocol.  She will continue her anti-estrogen therapy with anastrozole. Thus far, she is tolerating the Anastrozole well, with minimal side effects.  Her mammogram is due 07/2020; orders placed today. Today, a  comprehensive survivorship care plan and treatment summary was reviewed with the patient today detailing her breast cancer diagnosis, treatment course, potential late/long-term effects of treatment, appropriate follow-up care with recommendations for the future, and patient education resources.  A copy of this summary, along with a letter will be sent to the patient's primary care provider via mail/fax/In Basket message after today's visit.    2. Bone health:  Given [name]'s age/history of breast cancer and her current treatment  regimen including anti-estrogen therapy with Anastrozole, she is at risk for bone demineralization.  Her last DEXA scan was 08/2019, which showed osteopenia with a T score of -1.8.  She will be due for a repeat bone density in 08/2021..  In the meantime, she was encouraged to increase her consumption of foods rich in calcium, as well as increase her weight-bearing activities.  She was given education on specific activities to promote bone health.  3. Cancer screening:  Due to Ms. Blaze's history and her age, she should receive screening for skin cancers, colon cancer, and gynecologic cancers.  The information and recommendations are listed on the patient's comprehensive care plan/treatment summary and were reviewed in detail with the patient.    4. Health maintenance and wellness promotion: [name] was encouraged to consume 5-7 servings of fruits and vegetables per day. We reviewed the "Nutrition Rainbow" handout, as well as the handout "Take Control of Your Health and Reduce Your Cancer Risk" from the [employer].  She was also encouraged to engage in moderate to vigorous exercise for 30 minutes per day most days of the week. We discussed the [employer] fitness program, which is designed for cancer survivors to help them become more physically fit after cancer treatments.  She was instructed to limit her alcohol consumption and continue to abstain from tobacco  use.     5. Support services/counseling: It is not uncommon for this period of the patient's cancer care trajectory to be one of many emotions and stressors.  We discussed how this can be increasingly difficult during the times of quarantine and social distancing due to the COVID-19 pandemic.   She was given information regarding our available services and encouraged to contact me with any questions or for help enrolling in any of our support group/programs.    Follow up instructions:    -Return to cancer center in 03/2020 for f/u with Dr. Jana Hakim  -Mammogram due in 07/2020 -Follow up with surgery 09/2020 -She is welcome to return back to the Survivorship Clinic at any time; no additional follow-up needed at this time.  -Consider referral back to survivorship as a long-term survivor for continued surveillance  The patient was provided an opportunity to ask questions and all were answered. The patient agreed with the plan and demonstrated an understanding of the instructions.   The patient was advised to call back or seek an in-person evaluation if the symptoms worsen or if the condition fails to improve as anticipated.   I provided 15 minutes of non face-to-face telephone visit time during this encounter, and > 50% was spent counseling as documented under my assessment & plan.  Scot Dock, NP

## 2020-02-29 ENCOUNTER — Telehealth: Payer: Self-pay | Admitting: Adult Health

## 2020-02-29 NOTE — Telephone Encounter (Signed)
No 5/11 los. No changes made to pt's schedule.  

## 2020-03-14 DIAGNOSIS — M47812 Spondylosis without myelopathy or radiculopathy, cervical region: Secondary | ICD-10-CM | POA: Diagnosis not present

## 2020-03-14 DIAGNOSIS — M9903 Segmental and somatic dysfunction of lumbar region: Secondary | ICD-10-CM | POA: Diagnosis not present

## 2020-03-14 DIAGNOSIS — M9901 Segmental and somatic dysfunction of cervical region: Secondary | ICD-10-CM | POA: Diagnosis not present

## 2020-03-14 DIAGNOSIS — M9902 Segmental and somatic dysfunction of thoracic region: Secondary | ICD-10-CM | POA: Diagnosis not present

## 2020-03-14 DIAGNOSIS — S29012A Strain of muscle and tendon of back wall of thorax, initial encounter: Secondary | ICD-10-CM | POA: Diagnosis not present

## 2020-03-14 DIAGNOSIS — S39012A Strain of muscle, fascia and tendon of lower back, initial encounter: Secondary | ICD-10-CM | POA: Diagnosis not present

## 2020-03-23 ENCOUNTER — Telehealth: Payer: Self-pay | Admitting: Oncology

## 2020-03-23 NOTE — Telephone Encounter (Signed)
Rescheduled appts per 6/4 voicemail from pt. Pt confirmed new appt date and times.

## 2020-04-05 ENCOUNTER — Other Ambulatory Visit: Payer: Medicare PPO

## 2020-04-05 ENCOUNTER — Ambulatory Visit: Payer: Medicare PPO | Admitting: Oncology

## 2020-04-08 NOTE — Progress Notes (Signed)
Susan Randolph  [phone] [phone]     ID: Susan Randolph DOB: [birth year]  MR#: [id number]  ZHY#:[id number]  Patient Care Team: Josetta Huddle, MD as PCP - General (Internal Medicine) Mauro Kaufmann, RN as Oncology Nurse Navigator Rockwell Germany, RN as Oncology Nurse Navigator Alphonsa Overall, MD as Consulting Physician (General Surgery) Hamlet Lasecki, Virgie Dad, MD as Consulting Physician (Oncology) Eppie Gibson, MD as Attending Physician (Radiation Oncology) Gaynelle Arabian, MD as Consulting Physician (Orthopedic Surgery) Earnie Larsson, MD as Consulting Physician (Neurosurgery) Christophe Louis, MD as Consulting Physician (Obstetrics and Gynecology) Clarene Essex, MD as Consulting Physician (Gastroenterology) Chauncey Cruel, MD OTHER MD:  CHIEF COMPLAINT: estrogen receptor positive breast cancer  CURRENT TREATMENT: anastrozole   INTERVAL HISTORY: [name] return today for follow up of her estrogen receptor positive breast cancer.  She was started on anastrozole after her last visit.  She has been on it just about 3 months.  She is tolerating it well.  She does have hot flashes but they are "not that bad".  They wake her up early morning about 5 AM.  She takes trazodone at night and that helps her fall asleep.  She does have some vaginal dryness issues.  She is not using any lubricants at present.  Her most recent bone density screening on 08/25/2019 showed a T-score of -1.8, which is considered osteopenic.   REVIEW OF SYSTEMS: Susan Randolph is not exercising regularly because of her right knee issues.  She has been offered surgery but think she is not quite ready for that.  She is considering going to water aerobics.  She had both doses of the Pfizer vaccine and tolerated them well.  The big news for her is her grand daughter's wedding in a week.  Both her granddaughter and her fianc will be going to [place] for chiropractor school.  A detailed review of  systems today was otherwise stable.   HISTORY OF CURRENT ILLNESS: From the original intake note:  Susan Randolph had routine screening mammography on 08/02/2019 showing a possible abnormality in the left breast. She underwent left diagnostic mammography with tomography and left breast ultrasonography at [facility] on 08/08/2019 showing: breast density category B; 6 mm mass in the left breast at 12 o'clock; no enlarged or abnormal left axillary lymph nodes.  Accordingly on 08/09/2019 she proceeded to biopsy of the left breast area in question. The pathology from this procedure ([id number]) showed: invasive mammary carcinoma, grade 2, e-cadherin positive. Prognostic indicators significant for: estrogen receptor, 100% positive and progesterone receptor, 95% positive, both with strong staining intensity. Proliferation marker Ki67 at 2%. HER2 equivocal by immunohistochemistry (2+), but negative by fluorescent in situ hybridization with a signals ratio 1.19 and number per cell 1.85.  The patient's subsequent history is as detailed below.   PAST MEDICAL HISTORY: Past Medical History:  Diagnosis Date  . Anxiety   . Arthritis   . Breast cancer ([facility]) 06/2019   left IMC  . Cancer ([facility])    basal cell carcinoma  . GERD (gastroesophageal reflux disease)   . Headache   . History of radiation therapy 10/24/19- 11/18/19   Left Breast 15 fractions of 2.67 Gy to total 40.05 Gy. Left breast boost 5 fractions of 2 Gy to total 10 Gy  . Hypertension   . Rectal bleed 09/2016    PAST SURGICAL HISTORY: Past Surgical History:  Procedure Laterality Date  . BREAST LUMPECTOMY WITH RADIOACTIVE SEED AND SENTINEL LYMPH NODE BIOPSY  Left 08/31/2019   Procedure: LEFT BREAST LUMPECTOMY WITH RADIOACTIVE SEED AND LEFT AXILLARY SENTINEL LYMPH NODE BIOPSY;  Surgeon: Alphonsa Overall, MD;  Location: [facility];  Service: General;  Laterality: Left;  . EYE SURGERY     Corrected drooping eye lids bilateral   . MENISCUS REPAIR Right 03/29/2019  . POSTERIOR CERVICAL FUSION/FORAMINOTOMY N/A 01/11/2018   Procedure: Posterior Cervical Fusion with lateral mass fixation - Cervical one-Cervical two with Iliac Crest bone graft;  Surgeon: Earnie Larsson, MD;  Location: [facility];  Service: Neurosurgery;  Laterality: N/A;  . TUBAL LIGATION      FAMILY HISTORY: Family History  Problem Relation Age of Onset  . Cancer Mother        breast cancer/endometrial cancer  . Hypertension Mother   . Breast cancer Mother   . Hypertension Father   . Cancer Father        Glioblastoma  . Arthritis Father   . Rheum arthritis Maternal Uncle   . Colon cancer Maternal Uncle    Patient's father was 66 years old when he died from [place]. Patient's mother is 43 years old as of 07/2019.  She was diagnosed with breast cancer at age 28 and with endometrial cancer at age 61. The patient denies a family hx of ovarian cancer. She has no siblings. She also notes a maternal uncle with colon cancer in his 73s.   GYNECOLOGIC HISTORY:  No LMP recorded. Patient is postmenopausal. Menarche: 74 years old Age at first live birth: 74 years old [place] P 3 LMP age 47 Contraceptive yes, unsure how long, no issues HRT yes, used for 5 years  Hysterectomy? no BSO? no   SOCIAL HISTORY: (updated 07/2019)  [name] is currently retired from working as a [employer].  Husband Susan Randolph") is a retired Clinical biochemist. She lives at home with [name]. Daughter Susan Randolph, age 35, is a homemaker in  [place]. Daughter Susan Randolph, age 51, is a Marine scientist at the [employer] hospital in [facility]. The patient lost her son Susan Randolph to a car accident in 2015.  The patient has 3 grandchildren.  She attends [employer].    ADVANCED DIRECTIVES: In the absence of any documentation to the contrary, the patient's spouse is her 74.   HEALTH MAINTENANCE: Social History   Tobacco Use  . Smoking status: Never Smoker  . Smokeless tobacco:  Never Used  Vaping Use  . Vaping Use: Never used  Substance Use Topics  . Alcohol use: Yes    Comment: social  . Drug use: No     Colonoscopy: 2014  PAP: 03/2017, negative  Bone density: scheduled 08/2019   Allergies  Allergen Reactions  . Sulfa Antibiotics Other (See Comments)    Doesn't remember, happened in childhood  . Amitriptyline Other (See Comments)    tremors    Current Outpatient Medications  Medication Sig Dispense Refill  . acetaminophen (TYLENOL) 500 MG tablet Take 500 mg by mouth 2 (two) times daily as needed for mild pain.     Marland Kitchen ALPRAZolam (XANAX) 0.5 MG tablet Take 0.25-0.5 mg by mouth 3 (three) times daily as needed for anxiety.    Marland Kitchen anastrozole (ARIMIDEX) 1 MG tablet Take 1 tablet (1 mg total) by mouth daily. 90 tablet 4  . bimatoprost (LUMIGAN) 0.01 % SOLN Place 1 drop into both eyes at bedtime.     . Cholecalciferol (VITAMIN D3) 2000 units capsule Take 2,000 Units by mouth daily.     Marland Kitchen  fluticasone (FLONASE) 50 MCG/ACT nasal spray Place 2 sprays into both nostrils daily as needed for allergies or rhinitis.    . Guaifenesin (MUCINEX MAXIMUM STRENGTH) 1200 MG TB12 Take 2 tablets by mouth 2 (two) times daily as needed (cold symptoms).    Marland Kitchen HYDROcodone-acetaminophen (NORCO/VICODIN) 5-325 MG tablet Take 1 tablet by mouth every 6 (six) hours as needed for moderate pain. (Patient not taking: Reported on 09/21/2019) 10 tablet 0  . lisinopril-hydrochlorothiazide (PRINZIDE,ZESTORETIC) 10-12.5 MG tablet Take 1 tablet by mouth daily.  9  . loratadine (CLARITIN) 10 MG tablet Take 10 mg by mouth daily.    . Melatonin 10 MG CAPS Take by mouth.    . meloxicam (MOBIC) 7.5 MG tablet Take 7.5 mg by mouth daily.    . methocarbamol (ROBAXIN) 500 MG tablet TAKE 1 TABLET BY MOUTH THREE TIMES A DAY AS NEEDED FOR MUSCLE SPASMS    . Multiple Vitamins-Minerals (ALIVE WOMENS 50+) TABS Take 1 tablet by mouth daily.    Marland Kitchen omeprazole (PRILOSEC) 20 MG capsule Take 20 mg by mouth daily.    .  traZODone (DESYREL) 50 MG tablet Take 50 mg by mouth at bedtime.     No current facility-administered medications for this visit.    OBJECTIVE: white woman in no acute distress  Vitals:   04/09/20 1319  BP: (!) 147/77  Pulse: 62  Resp: 18  Temp: 97.8 F (36.6 C)  SpO2: 100%     Body mass index is 23.89 kg/m.   Wt Readings from Last 3 Encounters:  04/09/20 139 lb 3.2 oz (63.1 kg)  11/21/19 140 lb 1.6 oz (63.5 kg)  09/21/19 142 lb 12.8 oz (64.8 kg)      ECOG FS:1 - Symptomatic but completely ambulatory  Sclerae unicteric, EOMs intact Wearing a mask No cervical or supraclavicular adenopathy Lungs no rales or rhonchi Heart regular rate and rhythm Abd soft, nontender, positive bowel sounds MSK no focal spinal tenderness, no upper extremity lymphedema Neuro: nonfocal, well oriented, appropriate affect Breasts: The right breast is unremarkable.  Left breast is status post lumpectomy and radiation.  There is an area of induration in the superior aspect of the breast which is going to be scar tissue.  Both axillae are benign.   LAB RESULTS:  CMP     Component Value Date/Time   NA 140 08/17/2019 0846   K 3.8 08/17/2019 0846   CL 103 08/17/2019 0846   CO2 26 08/17/2019 0846   GLUCOSE 99 08/17/2019 0846   BUN 22 08/17/2019 0846   CREATININE 0.83 08/17/2019 0846   CALCIUM 10.5 (H) 08/17/2019 0846   PROT 7.2 08/17/2019 0846   ALBUMIN 4.5 08/17/2019 0846   AST 16 08/17/2019 0846   ALT 17 08/17/2019 0846   ALKPHOS 64 08/17/2019 0846   BILITOT 0.4 08/17/2019 0846   GFRNONAA >60 08/17/2019 0846   GFRAA >60 08/17/2019 0846    No results found for: TOTALPROTELP, ALBUMINELP, A1GS, A2GS, BETS, BETA2SER, GAMS, MSPIKE, SPEI  No results found for: KPAFRELGTCHN, LAMBDASER, KAPLAMBRATIO  Lab Results  Component Value Date   WBC 5.2 04/09/2020   NEUTROABS 2.9 04/09/2020   HGB 11.9 (L) 04/09/2020   HCT 36.2 04/09/2020   MCV 91.2 04/09/2020   PLT 167 04/09/2020   No results  found for: LABCA2  No components found for: FBPZWC585  No results for input(s): INR in the last 168 hours.  No results found for: LABCA2  No results found for: IDP824  No results found for: MPN361  No results found for: GYJ856  No results found for: CA2729  No components found for: HGQUANT  No results found for: CEA1 / No results found for: CEA1   No results found for: AFPTUMOR  No results found for: CHROMOGRNA  No results found for: HGBA, HGBA2QUANT, HGBFQUANT, HGBSQUAN (Hemoglobinopathy evaluation)   No results found for: LDH  No results found for: IRON, TIBC, IRONPCTSAT (Iron and TIBC)  No results found for: FERRITIN  Urinalysis No results found for: COLORURINE, APPEARANCEUR, LABSPEC, PHURINE, GLUCOSEU, HGBUR, BILIRUBINUR, KETONESUR, PROTEINUR, UROBILINOGEN, NITRITE, LEUKOCYTESUR   STUDIES: No results found.   ELIGIBLE FOR AVAILABLE RESEARCH PROTOCOL: no  ASSESSMENT: 74 y.o. [place] woman status post left breast upper outer quadrant biopsy 08/09/2019 for a clinical T1b N0, stage IA invasive ductal carcinoma, grade 2, estrogen and progesterone receptor positive, HER-2 not amplified, with an MIB-1 of 2%.  (1) status post left lumpectomy and sentinel lymph node sampling 08/31/2019 for a pT1a pN0, stage IA invasive ductal carcinoma, with negative margins  (2) adjuvant radiation 10/24/2019-11/18/2019 Site Technique Total Dose (Gy) Dose per Fx (Gy) Completed Fx Beam Energies  Breast, Left: Breast_Lt 3D 40.05/40.05 2.67 15/15 6X  Breast, Left: Breast_Lt_Bst 3D 10/10 2 5/5 6X   (3) started anastrozole 01/08/2020  (a) bone density 08/25/2019 shows a T score of -1.8 ([facility])   PLAN: Jocelyn Lamer is tolerating anastrozole generally well.  I think she would benefit from our pelvic rehab program and I have given her the information.  She will call to get more information and consider an appointment.  She will have mammography in October and see me in November.   From that point likely we will start seeing her on a once a year basis  She is limited in her exercise capacity because of her right knee problems.  She does not want to have surgery yet.  She is considering a water aerobic exercises which I think is a good option for her  She knows to call for any other issues that may develop before the next visit  Total encounter time 25 minutes.Chauncey Cruel, MD   04/09/2020 1:39 PM Medical Oncology and Hematology [facility] [place], [place] [address] Tel. [phone]    Fax. [phone]   This document serves as a record of services personally performed by Lurline Del, MD. It was created on his behalf by Wilburn Mylar, a trained medical scribe. The creation of this record is based on the scribe's personal observations and the provider's statements to them.   I, Lurline Del MD, have reviewed the above documentation for accuracy and completeness, and I agree with the above.   *Total Encounter Time as defined by the Centers for Medicare and Medicaid Services includes, in addition to the face-to-face time of a patient visit (documented in the note above) non-face-to-face time: obtaining and reviewing outside history, ordering and reviewing medications, tests or procedures, care coordination (communications with other health care professionals or caregivers) and documentation in the medical record.

## 2020-04-09 ENCOUNTER — Inpatient Hospital Stay: Payer: Medicare PPO | Admitting: Oncology

## 2020-04-09 ENCOUNTER — Inpatient Hospital Stay: Payer: Medicare PPO | Attending: Oncology

## 2020-04-09 ENCOUNTER — Other Ambulatory Visit: Payer: Self-pay

## 2020-04-09 VITALS — BP 147/77 | HR 62 | Temp 97.8°F | Resp 18 | Ht 64.0 in | Wt 139.2 lb

## 2020-04-09 DIAGNOSIS — K219 Gastro-esophageal reflux disease without esophagitis: Secondary | ICD-10-CM | POA: Diagnosis not present

## 2020-04-09 DIAGNOSIS — C50412 Malignant neoplasm of upper-outer quadrant of left female breast: Secondary | ICD-10-CM

## 2020-04-09 DIAGNOSIS — R232 Flushing: Secondary | ICD-10-CM | POA: Insufficient documentation

## 2020-04-09 DIAGNOSIS — I10 Essential (primary) hypertension: Secondary | ICD-10-CM | POA: Insufficient documentation

## 2020-04-09 DIAGNOSIS — Z170 Estrogen receptor positive status [ER+]: Secondary | ICD-10-CM

## 2020-04-09 DIAGNOSIS — Z79811 Long term (current) use of aromatase inhibitors: Secondary | ICD-10-CM | POA: Insufficient documentation

## 2020-04-09 DIAGNOSIS — Z79899 Other long term (current) drug therapy: Secondary | ICD-10-CM | POA: Insufficient documentation

## 2020-04-09 DIAGNOSIS — M8580 Other specified disorders of bone density and structure, unspecified site: Secondary | ICD-10-CM | POA: Diagnosis not present

## 2020-04-09 DIAGNOSIS — M1990 Unspecified osteoarthritis, unspecified site: Secondary | ICD-10-CM | POA: Insufficient documentation

## 2020-04-09 LAB — COMPREHENSIVE METABOLIC PANEL
ALT: 18 U/L (ref 0–44)
AST: 19 U/L (ref 15–41)
Albumin: 4 g/dL (ref 3.5–5.0)
Alkaline Phosphatase: 62 U/L (ref 38–126)
Anion gap: 9 (ref 5–15)
BUN: 20 mg/dL (ref 8–23)
CO2: 28 mmol/L (ref 22–32)
Calcium: 10.2 mg/dL (ref 8.9–10.3)
Chloride: 103 mmol/L (ref 98–111)
Creatinine, Ser: 0.78 mg/dL (ref 0.44–1.00)
GFR calc Af Amer: >60 mL/min (ref >60)
GFR calc non Af Amer: >60 mL/min (ref >60)
Glucose, Bld: 84 mg/dL (ref 70–99)
Potassium: 4.2 mmol/L (ref 3.5–5.1)
Sodium: 140 mmol/L (ref 135–145)
Total Bilirubin: 0.4 mg/dL (ref 0.3–1.2)
Total Protein: 6.5 g/dL (ref 6.5–8.1)

## 2020-04-09 LAB — CBC WITH DIFFERENTIAL/PLATELET
Abs Immature Granulocytes: 0.01 10*3/uL (ref 0.00–0.07)
Basophils Absolute: 0 10*3/uL (ref 0.0–0.1)
Basophils Relative: 1 %
Eosinophils Absolute: 0.1 10*3/uL (ref 0.0–0.5)
Eosinophils Relative: 3 %
HCT: 36.2 % (ref 36.0–46.0)
Hemoglobin: 11.9 g/dL — ABNORMAL LOW (ref 12.0–15.0)
Immature Granulocytes: 0 %
Lymphocytes Relative: 34 %
Lymphs Abs: 1.7 10*3/uL (ref 0.7–4.0)
MCH: 30 pg (ref 26.0–34.0)
MCHC: 32.9 g/dL (ref 30.0–36.0)
MCV: 91.2 fL (ref 80.0–100.0)
Monocytes Absolute: 0.4 10*3/uL (ref 0.1–1.0)
Monocytes Relative: 7 %
Neutro Abs: 2.9 10*3/uL (ref 1.7–7.7)
Neutrophils Relative %: 55 %
Platelets: 167 10*3/uL (ref 150–400)
RBC: 3.97 MIL/uL (ref 3.87–5.11)
RDW: 12.2 % (ref 11.5–15.5)
WBC: 5.2 10*3/uL (ref 4.0–10.5)
nRBC: 0 % (ref 0.0–0.2)

## 2020-04-10 ENCOUNTER — Telehealth: Payer: Self-pay | Admitting: Oncology

## 2020-04-10 NOTE — Telephone Encounter (Signed)
Scheduled appts per 6/21 los. Left voicemail with appt date and time.

## 2020-04-24 DIAGNOSIS — M9901 Segmental and somatic dysfunction of cervical region: Secondary | ICD-10-CM | POA: Diagnosis not present

## 2020-04-24 DIAGNOSIS — S29012A Strain of muscle and tendon of back wall of thorax, initial encounter: Secondary | ICD-10-CM | POA: Diagnosis not present

## 2020-04-24 DIAGNOSIS — M47812 Spondylosis without myelopathy or radiculopathy, cervical region: Secondary | ICD-10-CM | POA: Diagnosis not present

## 2020-04-24 DIAGNOSIS — M9902 Segmental and somatic dysfunction of thoracic region: Secondary | ICD-10-CM | POA: Diagnosis not present

## 2020-04-24 DIAGNOSIS — M9903 Segmental and somatic dysfunction of lumbar region: Secondary | ICD-10-CM | POA: Diagnosis not present

## 2020-04-24 DIAGNOSIS — S39012A Strain of muscle, fascia and tendon of lower back, initial encounter: Secondary | ICD-10-CM | POA: Diagnosis not present

## 2020-05-22 DIAGNOSIS — M9903 Segmental and somatic dysfunction of lumbar region: Secondary | ICD-10-CM | POA: Diagnosis not present

## 2020-05-22 DIAGNOSIS — M47812 Spondylosis without myelopathy or radiculopathy, cervical region: Secondary | ICD-10-CM | POA: Diagnosis not present

## 2020-05-22 DIAGNOSIS — M9901 Segmental and somatic dysfunction of cervical region: Secondary | ICD-10-CM | POA: Diagnosis not present

## 2020-05-22 DIAGNOSIS — M9902 Segmental and somatic dysfunction of thoracic region: Secondary | ICD-10-CM | POA: Diagnosis not present

## 2020-05-22 DIAGNOSIS — S39012A Strain of muscle, fascia and tendon of lower back, initial encounter: Secondary | ICD-10-CM | POA: Diagnosis not present

## 2020-05-22 DIAGNOSIS — S29012A Strain of muscle and tendon of back wall of thorax, initial encounter: Secondary | ICD-10-CM | POA: Diagnosis not present

## 2020-06-15 DIAGNOSIS — R0789 Other chest pain: Secondary | ICD-10-CM | POA: Diagnosis not present

## 2020-06-15 DIAGNOSIS — E559 Vitamin D deficiency, unspecified: Secondary | ICD-10-CM | POA: Diagnosis not present

## 2020-06-15 DIAGNOSIS — G2581 Restless legs syndrome: Secondary | ICD-10-CM | POA: Diagnosis not present

## 2020-06-15 DIAGNOSIS — M5030 Other cervical disc degeneration, unspecified cervical region: Secondary | ICD-10-CM | POA: Diagnosis not present

## 2020-06-15 DIAGNOSIS — Z79899 Other long term (current) drug therapy: Secondary | ICD-10-CM | POA: Diagnosis not present

## 2020-06-15 DIAGNOSIS — R5383 Other fatigue: Secondary | ICD-10-CM | POA: Diagnosis not present

## 2020-06-15 DIAGNOSIS — I10 Essential (primary) hypertension: Secondary | ICD-10-CM | POA: Diagnosis not present

## 2020-06-15 DIAGNOSIS — Z1211 Encounter for screening for malignant neoplasm of colon: Secondary | ICD-10-CM | POA: Diagnosis not present

## 2020-06-15 DIAGNOSIS — G4700 Insomnia, unspecified: Secondary | ICD-10-CM | POA: Diagnosis not present

## 2020-07-11 DIAGNOSIS — M47812 Spondylosis without myelopathy or radiculopathy, cervical region: Secondary | ICD-10-CM | POA: Diagnosis not present

## 2020-07-11 DIAGNOSIS — M9901 Segmental and somatic dysfunction of cervical region: Secondary | ICD-10-CM | POA: Diagnosis not present

## 2020-07-11 DIAGNOSIS — M9903 Segmental and somatic dysfunction of lumbar region: Secondary | ICD-10-CM | POA: Diagnosis not present

## 2020-07-11 DIAGNOSIS — M9902 Segmental and somatic dysfunction of thoracic region: Secondary | ICD-10-CM | POA: Diagnosis not present

## 2020-07-11 DIAGNOSIS — S29012A Strain of muscle and tendon of back wall of thorax, initial encounter: Secondary | ICD-10-CM | POA: Diagnosis not present

## 2020-07-11 DIAGNOSIS — S39012A Strain of muscle, fascia and tendon of lower back, initial encounter: Secondary | ICD-10-CM | POA: Diagnosis not present

## 2020-08-07 ENCOUNTER — Other Ambulatory Visit: Payer: Self-pay

## 2020-08-07 ENCOUNTER — Ambulatory Visit
Admission: RE | Admit: 2020-08-07 | Discharge: 2020-08-07 | Disposition: A | Discharge status: Home / Self Care | Payer: Medicare PPO | Source: Ambulatory Visit | Attending: Oncology | Admitting: Oncology

## 2020-08-07 DIAGNOSIS — Z853 Personal history of malignant neoplasm of breast: Secondary | ICD-10-CM | POA: Diagnosis not present

## 2020-08-07 DIAGNOSIS — C50412 Malignant neoplasm of upper-outer quadrant of left female breast: Secondary | ICD-10-CM

## 2020-08-07 DIAGNOSIS — R922 Inconclusive mammogram: Secondary | ICD-10-CM | POA: Diagnosis not present

## 2020-08-07 DIAGNOSIS — Z170 Estrogen receptor positive status [ER+]: Secondary | ICD-10-CM

## 2020-08-10 DIAGNOSIS — L814 Other melanin hyperpigmentation: Secondary | ICD-10-CM | POA: Diagnosis not present

## 2020-08-10 DIAGNOSIS — L578 Other skin changes due to chronic exposure to nonionizing radiation: Secondary | ICD-10-CM | POA: Diagnosis not present

## 2020-08-20 DIAGNOSIS — J310 Chronic rhinitis: Secondary | ICD-10-CM | POA: Diagnosis not present

## 2020-08-20 DIAGNOSIS — J343 Hypertrophy of nasal turbinates: Secondary | ICD-10-CM | POA: Diagnosis not present

## 2020-08-27 NOTE — Progress Notes (Signed)
[place]  [phone] [phone]) [phone]     ID: Susan Randolph DOB: [birth year]  MR#: [id number]  KDT#:[id number]  Patient Care Team: Josetta Huddle, MD as PCP - General (Internal Medicine) Mauro Kaufmann, RN as Oncology Nurse Navigator Rockwell Germany, RN as Oncology Nurse Navigator Alphonsa Overall, MD as Consulting Physician (General Surgery) Mollyann Halbert, Virgie Dad, MD as Consulting Physician (Oncology) Eppie Gibson, MD as Attending Physician (Radiation Oncology) Gaynelle Arabian, MD as Consulting Physician (Orthopedic Surgery) Earnie Larsson, MD as Consulting Physician (Neurosurgery) Christophe Louis, MD as Consulting Physician (Obstetrics and Gynecology) Clarene Essex, MD as Consulting Physician (Gastroenterology) Chauncey Cruel, MD OTHER MD:  CHIEF COMPLAINT: estrogen receptor positive breast cancer  CURRENT TREATMENT: anastrozole   INTERVAL HISTORY: Susan Randolph return today for follow up of her estrogen receptor positive breast cancer.  She continues on anastrozole and is tolerating it well.  She does have hot flashes but they are "not that bad".  They wake her up early morning about 5 AM.  She takes trazodone at night and that helps her fall asleep. She does have some vaginal dryness issues.  She is not using any lubricants at present.  Her most recent bone density screening on 08/25/2019 showed a T-score of -1.8, which is considered osteopenic.  Since her last visit, she underwent bilateral diagnostic mammography with tomography at [facility] on 08/07/2020 showing: breast density category C; no evidence of malignancy in either breast.   REVIEW OF SYSTEMS: Susan Randolph continues to have severe knee problems.  She is scheduled for partial knee replacement 10/31/2020.  She is going to water aerobics though 3 times a week and greatly profit doing from that.   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 She underwent left diagnostic mammography with tomography and left breast ultrasonography at [facility] on 08/08/2019 showing: breast density category B; 6 mm mass in the left breast at 12 o'clock; no enlarged or abnormal left axillary lymph nodes.  Accordingly on 08/09/2019 she proceeded to biopsy of the left breast area in question. The pathology from this procedure ([id number]) showed: invasive mammary carcinoma, grade 2, e-cadherin positive. Prognostic indicators significant for: estrogen receptor, 100% positive and progesterone receptor, 95% positive, both with strong staining intensity. Proliferation marker Ki67 at 2%. HER2 equivocal by immunohistochemistry (2+), but negative by fluorescent in situ hybridization with a signals ratio 1.19 and number per cell 1.85.  The patient's subsequent history is as detailed below.   PAST MEDICAL HISTORY: Past Medical History:  Diagnosis Date  . Anxiety   . Arthritis   . Breast cancer ([place]) 06/2019   left IMC  . Cancer ([place])    basal cell carcinoma  . GERD (gastroesophageal reflux disease)   . Headache   . History of radiation therapy 10/24/19- 11/18/19   Left Breast 15 fractions of 2.67 Gy to total 40.05 Gy. Left breast boost 5 fractions of 2 Gy to total 10 Gy  . Hypertension   . Personal history of radiation therapy 10/2019  . Rectal bleed 09/2016    PAST SURGICAL HISTORY: Past Surgical History:  Procedure Laterality Date  . BREAST BIOPSY Left 08/2019  . BREAST EXCISIONAL BIOPSY Left 08/2019  . BREAST LUMPECTOMY WITH RADIOACTIVE SEED AND SENTINEL LYMPH NODE BIOPSY Left 08/31/2019   Procedure: LEFT BREAST LUMPECTOMY WITH RADIOACTIVE  SEED AND LEFT AXILLARY SENTINEL LYMPH NODE BIOPSY;  Surgeon: Alphonsa Overall, MD;  Location: [facility];  Service: General;  Laterality: Left;  . EYE SURGERY      Corrected drooping eye lids bilateral  . MENISCUS REPAIR Right 03/29/2019  . POSTERIOR CERVICAL FUSION/FORAMINOTOMY N/A 01/11/2018   Procedure: Posterior Cervical Fusion with lateral mass fixation - Cervical one-Cervical two with Iliac Crest bone graft;  Surgeon: Earnie Larsson, MD;  Location: [facility];  Service: Neurosurgery;  Laterality: N/A;  . TUBAL LIGATION      FAMILY HISTORY: Family History  Problem Relation Age of Onset  . Cancer Mother        breast cancer/endometrial cancer  . Hypertension Mother   . Breast cancer Mother   . Hypertension Father   . Cancer Father        Glioblastoma  . Arthritis Father   . Rheum arthritis Maternal Uncle   . Colon cancer Maternal Uncle    Patient's father was 55 years old when he died from [place]. Patient's mother is 17 years old as of 07/2019.  She was diagnosed with breast cancer at age 60 and with endometrial cancer at age 60. The patient denies a family hx of ovarian cancer. She has no siblings. She also notes a maternal uncle with colon cancer in his 22s.   GYNECOLOGIC HISTORY:  No LMP recorded. Patient is postmenopausal. Menarche: 74 years old Age at first live birth: 74 years old Susan Randolph 3 LMP age 6 Contraceptive yes, unsure how long, no issues HRT yes, used for 5 years  Hysterectomy? no BSO? no   SOCIAL HISTORY: (updated 07/2019)  Susan Randolph is currently retired from working as a [employer].  Husband Nicolette Bang") is a retired Clinical biochemist. She lives at home with [name]. Daughter Susan Randolph, age 22, is a homemaker in  [place]. Daughter Susan Randolph, age 73, is a Marine scientist at the [employer] hospital in [place]. The patient lost her son Susan Randolph to a car accident in 2015.  The patient has 3 grandchildren.  She attends [employer].    ADVANCED DIRECTIVES: In the absence of any documentation to the contrary, the patient's spouse is her 34.   HEALTH MAINTENANCE: Social History   Tobacco Use  . Smoking status:  Never Smoker  . Smokeless tobacco: Never Used  Vaping Use  . Vaping Use: Never used  Substance Use Topics  . Alcohol use: Yes    Comment: social  . Drug use: No     Colonoscopy: 2014  PAP: 03/2017, negative  Bone density: scheduled 08/2019   Allergies  Allergen Reactions  . Sulfa Antibiotics Other (See Comments)    Doesn't remember, happened in childhood  . Amitriptyline Other (See Comments)    tremors    Current Outpatient Medications  Medication Sig Dispense Refill  . acetaminophen (TYLENOL) 500 MG tablet Take 500 mg by mouth 2 (two) times daily as needed for mild pain.     Marland Kitchen ALPRAZolam (XANAX) 0.5 MG tablet Take 0.25-0.5 mg by mouth 3 (three) times daily as needed for anxiety.    Marland Kitchen anastrozole (ARIMIDEX) 1 MG tablet Take 1 tablet (1 mg total) by mouth daily. 90 tablet 4  . bimatoprost (LUMIGAN) 0.01 % SOLN Place 1 drop into both eyes at bedtime.     . Cholecalciferol (VITAMIN D3) 2000 units capsule Take 2,000 Units by mouth daily.     . fluticasone (FLONASE) 50 MCG/ACT nasal spray Place 2 sprays  into both nostrils daily as needed for allergies or rhinitis.    Marland Kitchen gabapentin (NEURONTIN) 300 MG capsule Take 1 capsule (300 mg total) by mouth at bedtime. 90 capsule 4  . Guaifenesin (MUCINEX MAXIMUM STRENGTH) 1200 MG TB12 Take 2 tablets by mouth 2 (two) times daily as needed (cold symptoms).    Marland Kitchen lisinopril-hydrochlorothiazide (PRINZIDE,ZESTORETIC) 10-12.5 MG tablet Take 1 tablet by mouth daily.  9  . loratadine (CLARITIN) 10 MG tablet Take 10 mg by mouth daily.    . Melatonin 10 MG CAPS Take by mouth.    . Multiple Vitamins-Minerals (ALIVE WOMENS 50+) TABS Take 1 tablet by mouth daily.    Marland Kitchen omeprazole (PRILOSEC) 20 MG capsule Take 20 mg by mouth daily.    . traZODone (DESYREL) 50 MG tablet Take 50 mg by mouth at bedtime.     No current facility-administered medications for this visit.    OBJECTIVE: white woman who appears younger than stated age  53:   08/28/20 1155  BP:  (!) 163/73  Pulse: (!) 58  Resp: 18  Temp: (!) 97.1 F (36.2 C)  SpO2: 100%     Body mass index is 24.03 kg/m.   Wt Readings from Last 3 Encounters:  08/28/20 140 lb (63.5 kg)  04/09/20 139 lb 3.2 oz (63.1 kg)  11/21/19 140 lb 1.6 oz (63.5 kg)      ECOG FS:1 - Symptomatic but completely ambulatory  Sclerae unicteric, EOMs intact Wearing a mask No cervical or supraclavicular adenopathy Lungs no rales or rhonchi Heart regular rate and rhythm Abd soft, nontender, positive bowel sounds MSK no focal spinal tenderness, no upper extremity lymphedema Neuro: nonfocal, well oriented, appropriate affect Breasts: The right breast is benign.  The left breast is status post lumpectomy and radiation.  There is some scar tissue superiorly as noted previously.  Both axillae are benign.   LAB RESULTS:  CMP     Component Value Date/Time   NA 140 04/09/2020 1255   K 4.2 04/09/2020 1255   CL 103 04/09/2020 1255   CO2 28 04/09/2020 1255   GLUCOSE 84 04/09/2020 1255   BUN 20 04/09/2020 1255   CREATININE 0.78 04/09/2020 1255   CREATININE 0.83 08/17/2019 0846   CALCIUM 10.2 04/09/2020 1255   PROT 6.5 04/09/2020 1255   ALBUMIN 4.0 04/09/2020 1255   AST 19 04/09/2020 1255   AST 16 08/17/2019 0846   ALT 18 04/09/2020 1255   ALT 17 08/17/2019 0846   ALKPHOS 62 04/09/2020 1255   BILITOT 0.4 04/09/2020 1255   BILITOT 0.4 08/17/2019 0846   GFRNONAA >60 04/09/2020 1255   GFRNONAA >60 08/17/2019 0846   GFRAA >60 04/09/2020 1255   GFRAA >60 08/17/2019 0846    No results found for: TOTALPROTELP, ALBUMINELP, A1GS, A2GS, BETS, BETA2SER, GAMS, MSPIKE, SPEI  No results found for: KPAFRELGTCHN, LAMBDASER, KAPLAMBRATIO  Lab Results  Component Value Date   WBC 4.8 08/28/2020   NEUTROABS 2.5 08/28/2020   HGB 12.3 08/28/2020   HCT 36.9 08/28/2020   MCV 91.1 08/28/2020   PLT 175 08/28/2020   No results found for: LABCA2  No components found for: [id number]  No results for input(s): INR in  the last 168 hours.  No results found for: LABCA2  No results found for: WGN562  No results found for: ZHY865  No results found for: HQI696  No results found for: CA2729  No components found for: HGQUANT  No results found for: CEA1 / No results found for: CEA1  No results found for: AFPTUMOR  No results found for: CHROMOGRNA  No results found for: HGBA, HGBA2QUANT, HGBFQUANT, HGBSQUAN (Hemoglobinopathy evaluation)   No results found for: LDH  No results found for: IRON, TIBC, IRONPCTSAT (Iron and TIBC)  No results found for: FERRITIN  Urinalysis No results found for: COLORURINE, APPEARANCEUR, LABSPEC, PHURINE, GLUCOSEU, HGBUR, BILIRUBINUR, KETONESUR, PROTEINUR, UROBILINOGEN, NITRITE, LEUKOCYTESUR   STUDIES: MM DIAG BREAST TOMO BILATERAL  Result Date: 08/07/2020 CLINICAL DATA:  Status post LEFT lumpectomy with radiation treatment. Lumpectomy was in November 2020. Patient takes anastrozole. EXAM: DIGITAL DIAGNOSTIC BILATERAL MAMMOGRAM WITH TOMO AND CAD COMPARISON:  Previous exam(s). ACR Breast Density Category c: The breast tissue is heterogeneously dense, which may obscure small masses. FINDINGS: Post operative changes are seen in the LEFTbreast. No suspicious mass, distortion, or microcalcifications are identified to suggest presence of malignancy. Mammographic images were processed with CAD. IMPRESSION: No mammographic evidence for malignancy. RECOMMENDATION: Diagnostic mammogram is suggested in 1 year. (Code:[id number]-B-01Y) I have discussed the findings and recommendations with the patient. If applicable, a reminder letter will be sent to the patient regarding the next appointment. BI-RADS CATEGORY  2: Benign. Electronically Signed   By: Nolon Nations M.D.   On: 08/07/2020 10:50     ELIGIBLE FOR AVAILABLE RESEARCH PROTOCOL: no  ASSESSMENT: 74 y.o. [place] woman status post left breast upper outer quadrant biopsy 08/09/2019 for a clinical T1b N0, stage IA invasive ductal  carcinoma, grade 2, estrogen and progesterone receptor positive, HER-2 not amplified, with an MIB-1 of 2%.  (1) status post left lumpectomy and sentinel lymph node sampling 08/31/2019 for a pT1a pN0, stage IA invasive ductal carcinoma, with negative margins  (2) adjuvant radiation 10/24/2019-11/18/2019 Site Technique Total Dose (Gy) Dose per Fx (Gy) Completed Fx Beam Energies  Breast, Left: Breast_Lt 3D 40.05/40.05 2.67 15/15 6X  Breast, Left: Breast_Lt_Bst 3D 10/10 2 5/5 6X   (3) started anastrozole 01/08/2020  (a) bone density 08/25/2019 shows a T score of -1.8 ([facility])   PLAN: Susan Randolph is now a year out from definitive surgery for her breast cancer with no evidence of disease recurrence.  This is favorable.  She is tolerating anastrozole generally well.  She does have hot flashes which particularly wake her up at night.  I think she would do well with gabapentin.  We discussed the pluses and minuses of this medication and she will start 300 mg at bedtime.  If that does not quite do it we could double the dose but I have a feeling that may be all she needs.  She is propping from water aerobics.  She is scheduled for knee for surgery under Dr. Wynelle Link in January.  Hopefully that will free her so she can do more exercises but right now she is doing quite well with what she is able to get away with  At this point I feel comfortable seeing her on a once a year basis.  She knows to call for any other issues that may develop before the next visit  Total encounter time 25 minutes.Chauncey Cruel, MD   08/28/2020 12:22 PM Medical Oncology and Hematology [facility] [place], [place] [address] Tel. [phone]    Fax. [phone]   This document serves as a record of services personally performed by Lurline Del, MD. It was created on his behalf by Wilburn Mylar, a trained medical scribe. The creation of this record is based on the scribe's  personal observations and the  provider's statements to them.   I, Lurline Del MD, have reviewed the above documentation for accuracy and completeness, and I agree with the above.   *Total Encounter Time as defined by the Centers for Medicare and Medicaid Services includes, in addition to the face-to-face time of a patient visit (documented in the note above) non-face-to-face time: obtaining and reviewing outside history, ordering and reviewing medications, tests or procedures, care coordination (communications with other health care professionals or caregivers) and documentation in the medical record.

## 2020-08-28 ENCOUNTER — Inpatient Hospital Stay: Payer: Medicare PPO

## 2020-08-28 ENCOUNTER — Other Ambulatory Visit: Payer: Self-pay

## 2020-08-28 ENCOUNTER — Inpatient Hospital Stay: Payer: Medicare PPO | Attending: Oncology | Admitting: Oncology

## 2020-08-28 VITALS — BP 163/73 | HR 58 | Temp 97.1°F | Resp 18 | Ht 64.0 in | Wt 140.0 lb

## 2020-08-28 DIAGNOSIS — Z170 Estrogen receptor positive status [ER+]: Secondary | ICD-10-CM

## 2020-08-28 DIAGNOSIS — I10 Essential (primary) hypertension: Secondary | ICD-10-CM | POA: Insufficient documentation

## 2020-08-28 DIAGNOSIS — Z79811 Long term (current) use of aromatase inhibitors: Secondary | ICD-10-CM | POA: Insufficient documentation

## 2020-08-28 DIAGNOSIS — C50412 Malignant neoplasm of upper-outer quadrant of left female breast: Secondary | ICD-10-CM

## 2020-08-28 DIAGNOSIS — F419 Anxiety disorder, unspecified: Secondary | ICD-10-CM | POA: Diagnosis not present

## 2020-08-28 DIAGNOSIS — Z85828 Personal history of other malignant neoplasm of skin: Secondary | ICD-10-CM | POA: Insufficient documentation

## 2020-08-28 DIAGNOSIS — C50919 Malignant neoplasm of unspecified site of unspecified female breast: Secondary | ICD-10-CM | POA: Diagnosis not present

## 2020-08-28 DIAGNOSIS — Z79899 Other long term (current) drug therapy: Secondary | ICD-10-CM | POA: Insufficient documentation

## 2020-08-28 DIAGNOSIS — M8580 Other specified disorders of bone density and structure, unspecified site: Secondary | ICD-10-CM | POA: Diagnosis not present

## 2020-08-28 DIAGNOSIS — K219 Gastro-esophageal reflux disease without esophagitis: Secondary | ICD-10-CM | POA: Insufficient documentation

## 2020-08-28 DIAGNOSIS — M1990 Unspecified osteoarthritis, unspecified site: Secondary | ICD-10-CM | POA: Insufficient documentation

## 2020-08-28 DIAGNOSIS — Z923 Personal history of irradiation: Secondary | ICD-10-CM | POA: Insufficient documentation

## 2020-08-28 LAB — CBC WITH DIFFERENTIAL/PLATELET
Abs Immature Granulocytes: 0.01 10*3/uL (ref 0.00–0.07)
Basophils Absolute: 0 10*3/uL (ref 0.0–0.1)
Basophils Relative: 1 %
Eosinophils Absolute: 0.1 10*3/uL (ref 0.0–0.5)
Eosinophils Relative: 2 %
HCT: 36.9 % (ref 36.0–46.0)
Hemoglobin: 12.3 g/dL (ref 12.0–15.0)
Immature Granulocytes: 0 %
Lymphocytes Relative: 39 %
Lymphs Abs: 1.9 10*3/uL (ref 0.7–4.0)
MCH: 30.4 pg (ref 26.0–34.0)
MCHC: 33.3 g/dL (ref 30.0–36.0)
MCV: 91.1 fL (ref 80.0–100.0)
Monocytes Absolute: 0.3 10*3/uL (ref 0.1–1.0)
Monocytes Relative: 7 %
Neutro Abs: 2.5 10*3/uL (ref 1.7–7.7)
Neutrophils Relative %: 51 %
Platelets: 175 10*3/uL (ref 150–400)
RBC: 4.05 MIL/uL (ref 3.87–5.11)
RDW: 12 % (ref 11.5–15.5)
WBC: 4.8 10*3/uL (ref 4.0–10.5)
nRBC: 0 % (ref 0.0–0.2)

## 2020-08-28 LAB — COMPREHENSIVE METABOLIC PANEL
ALT: 16 U/L (ref 0–44)
AST: 19 U/L (ref 15–41)
Albumin: 4.1 g/dL (ref 3.5–5.0)
Alkaline Phosphatase: 71 U/L (ref 38–126)
Anion gap: 5 (ref 5–15)
BUN: 27 mg/dL — ABNORMAL HIGH (ref 8–23)
CO2: 27 mmol/L (ref 22–32)
Calcium: 9.8 mg/dL (ref 8.9–10.3)
Chloride: 105 mmol/L (ref 98–111)
Creatinine, Ser: 0.79 mg/dL (ref 0.44–1.00)
GFR, Estimated: >60 mL/min (ref >60)
Glucose, Bld: 98 mg/dL (ref 70–99)
Potassium: 4.3 mmol/L (ref 3.5–5.1)
Sodium: 137 mmol/L (ref 135–145)
Total Bilirubin: 0.5 mg/dL (ref 0.3–1.2)
Total Protein: 6.8 g/dL (ref 6.5–8.1)

## 2020-08-28 IMAGING — MG MM PLC BREAST LOC DEV 1ST LESION INC MAMMO GUIDE*L*
7 series · 7 of 7 positions shown · non-contrast
Comparison: Previous exam(s).

CLINICAL DATA: Recently diagnosed invasive mammary carcinoma in the
left breast.

EXAM:
MAMMOGRAPHIC GUIDED RADIOACTIVE SEED LOCALIZATION OF THE LEFT BREAST

[L CC (1 of 3)]
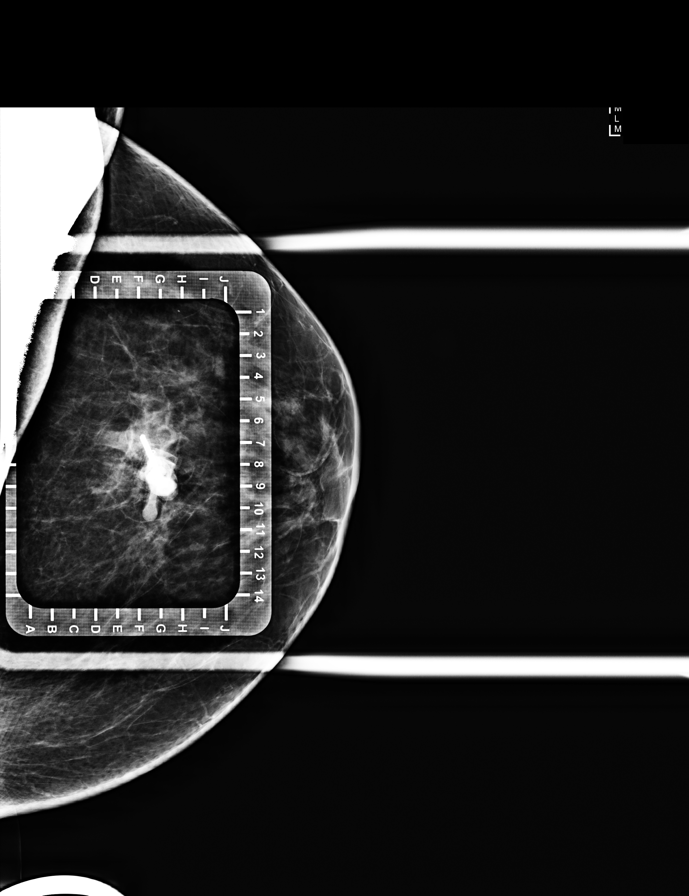

[L CC (2 of 3)]
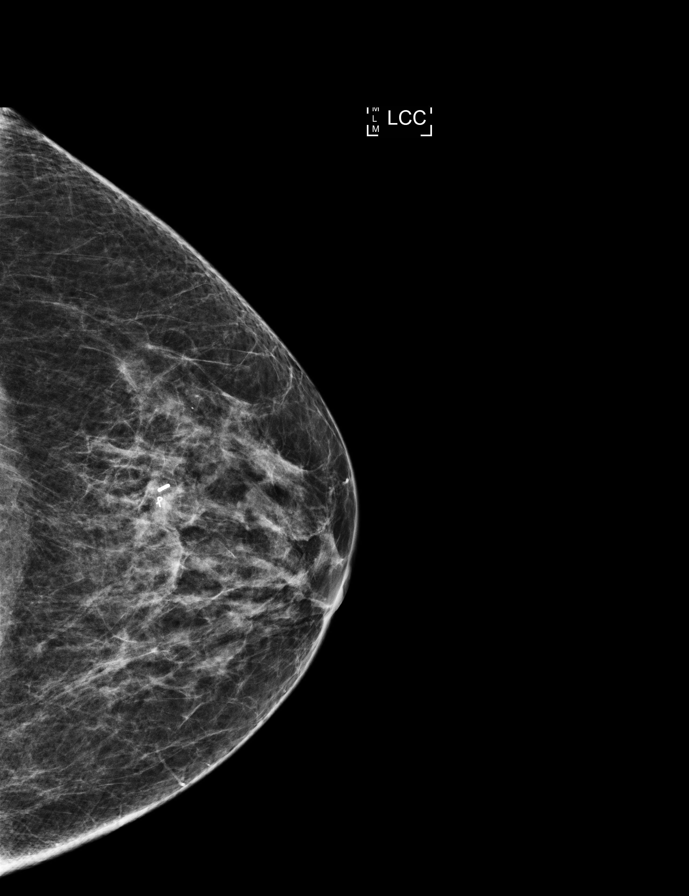

[L ML (1 of 4)]
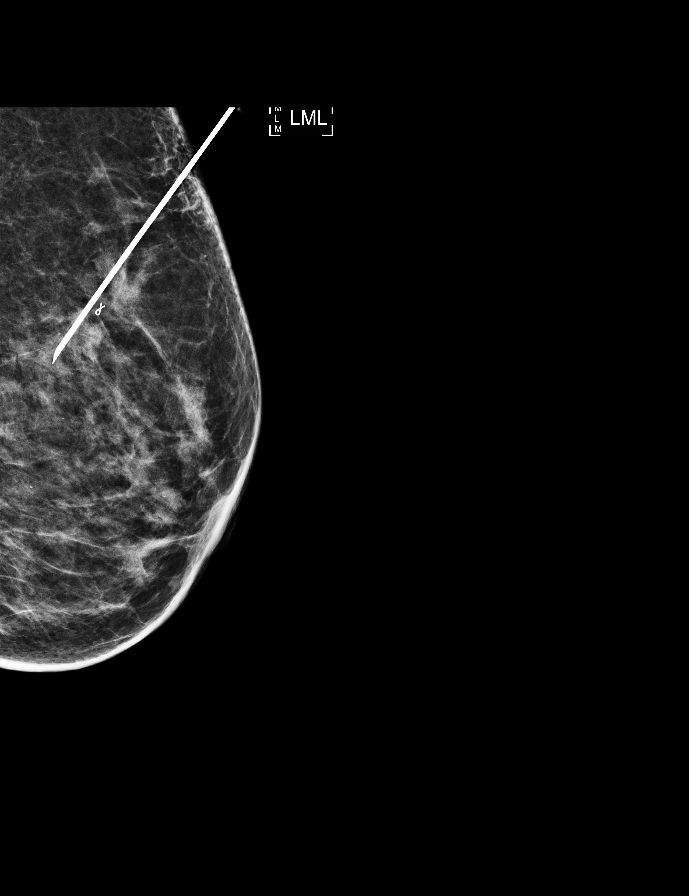

[L ML (2 of 4)]
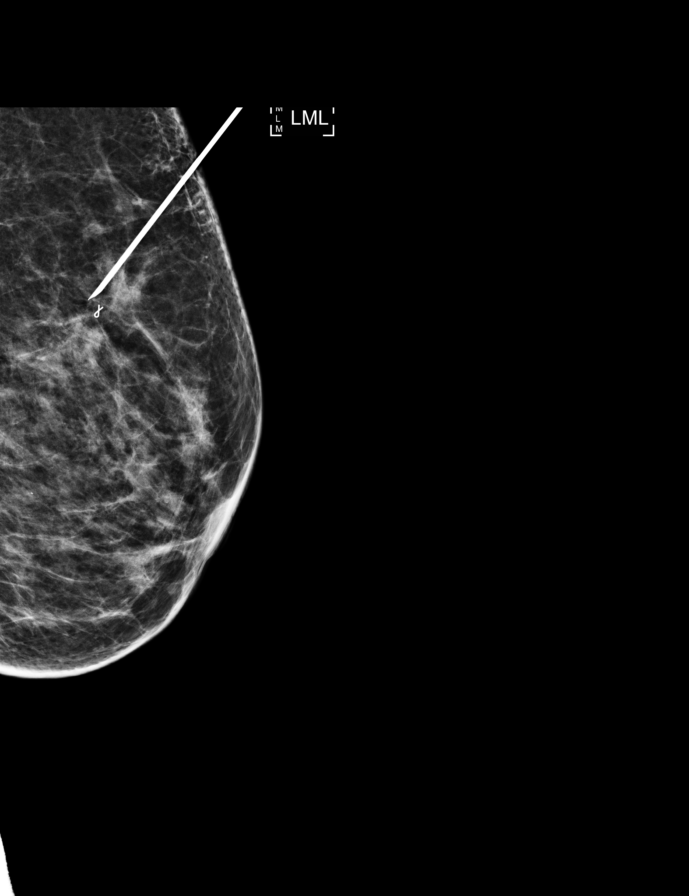

[L ML (3 of 4)]
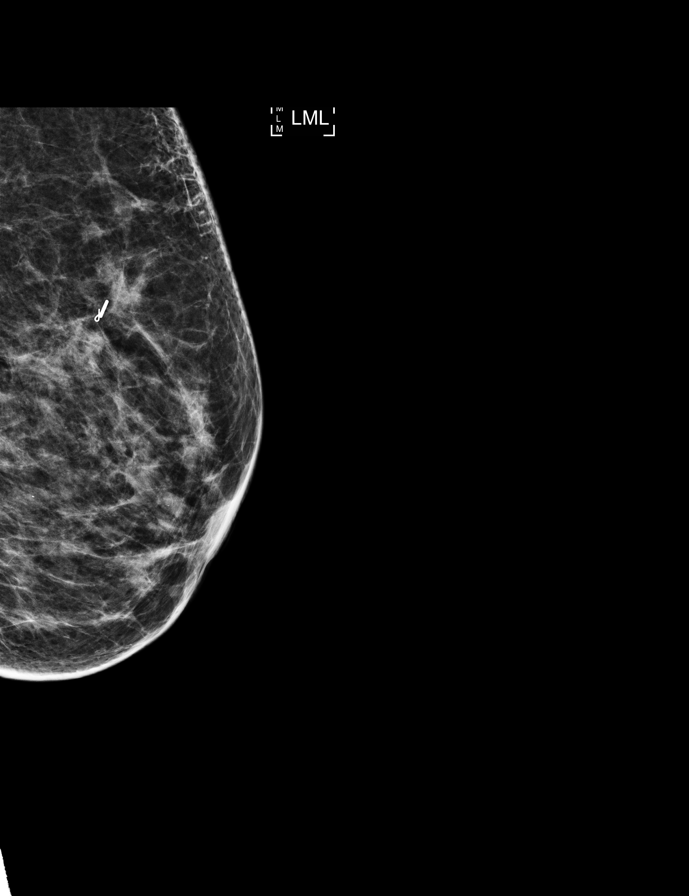

[L ML (4 of 4)]
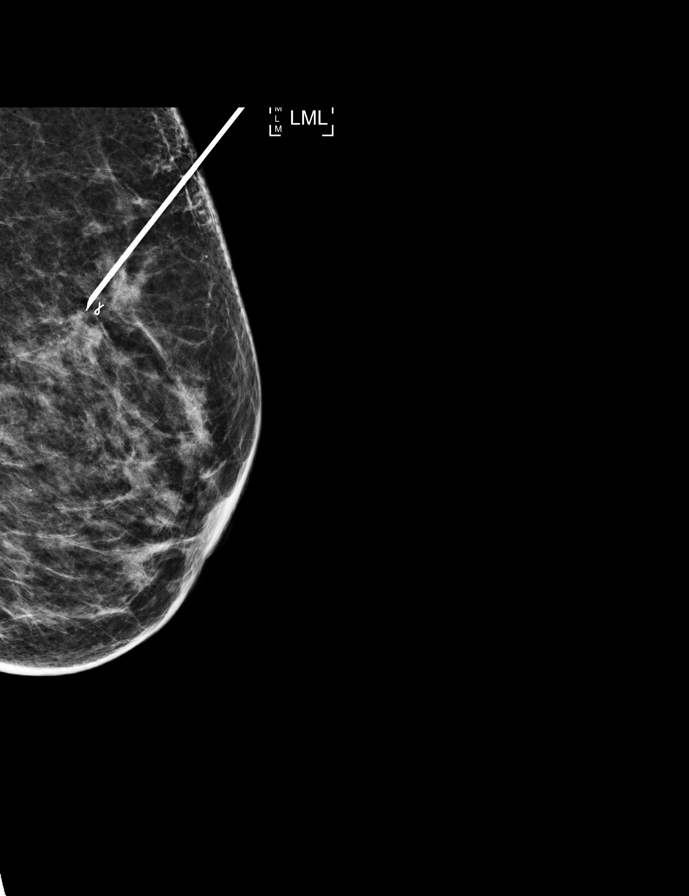

[L CC (3 of 3)]
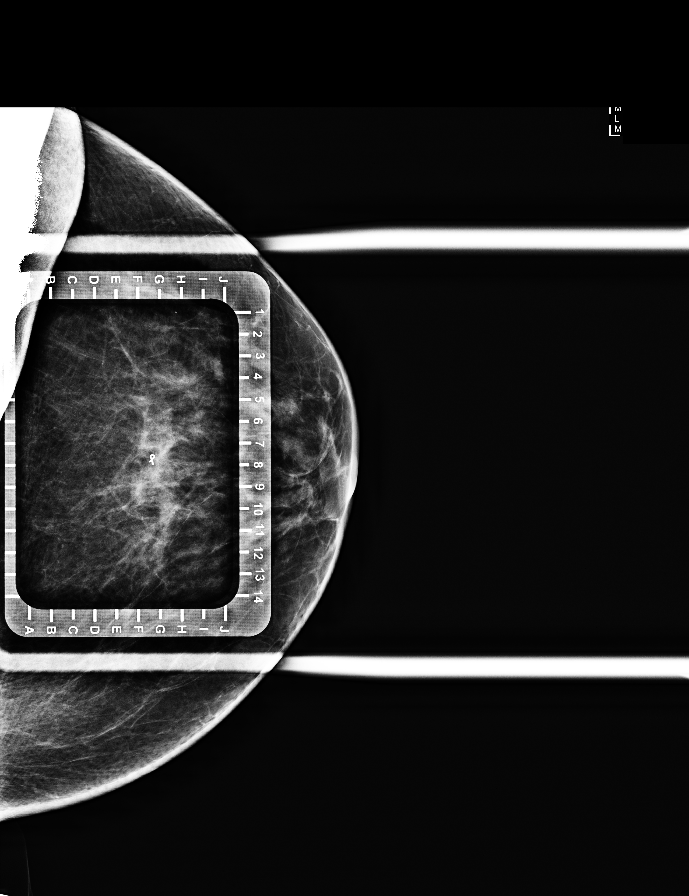

[7 of 7 positions shown; findings below may reference images not displayed]

FINDINGS: Patient presents for radioactive seed localization prior to left
lumpectomy. I met with the patient and we discussed the procedure of
seed localization including benefits and alternatives. We discussed
the high likelihood of a successful procedure. We discussed the
risks of the procedure including infection, bleeding, tissue injury
and further surgery. We discussed the low dose of radioactivity
involved in the procedure. Informed, written consent was given.

The usual time-out protocol was performed immediately prior to the
procedure.

Using mammographic guidance, sterile technique, 1% lidocaine and an
W-XQG radioactive seed, the recently placed ribbon biopsy marker
clip in the upper-outer left breast was localized using a cephalad
approach. The follow-up mammogram images confirm the seed in the
expected location and were marked for Dr. Eeckhout.

Follow-up survey of the patient confirms presence of the radioactive
seed.

Order number of [id number] seed:  [id number].

Total activity:  0.244 mCi reference Date: 08/15/2019

The patient tolerated the procedure well and was released from the
[REDACTED]. She was given instructions regarding seed removal.
IMPRESSION: Radioactive seed localization left breast. No apparent
complications.

## 2020-08-28 MED ORDER — ANASTROZOLE 1 MG PO TABS
1.0000 mg | ORAL_TABLET | Freq: Every day | ORAL | 4 refills | Status: DC
Start: 2020-08-28 — End: 2021-11-21

## 2020-08-28 MED ORDER — GABAPENTIN 300 MG PO CAPS: 300.0000 mg | ORAL_CAPSULE | Freq: Every day | ORAL | 4 refills | Status: DC

## 2020-09-18 DIAGNOSIS — H401131 Primary open-angle glaucoma, bilateral, mild stage: Secondary | ICD-10-CM | POA: Diagnosis not present

## 2020-09-18 DIAGNOSIS — H35371 Puckering of macula, right eye: Secondary | ICD-10-CM | POA: Diagnosis not present

## 2020-09-18 DIAGNOSIS — H524 Presbyopia: Secondary | ICD-10-CM | POA: Diagnosis not present

## 2020-09-18 DIAGNOSIS — H2513 Age-related nuclear cataract, bilateral: Secondary | ICD-10-CM | POA: Diagnosis not present

## 2020-09-18 DIAGNOSIS — H25013 Cortical age-related cataract, bilateral: Secondary | ICD-10-CM | POA: Diagnosis not present

## 2020-09-26 ENCOUNTER — Other Ambulatory Visit: Payer: Self-pay

## 2020-09-26 ENCOUNTER — Ambulatory Visit: Payer: Medicare PPO | Admitting: Cardiology

## 2020-09-26 ENCOUNTER — Encounter: Payer: Self-pay | Admitting: Cardiology

## 2020-09-26 VITALS — BP 120/70 | HR 55 | Ht 64.0 in | Wt 139.0 lb

## 2020-09-26 DIAGNOSIS — Z01810 Encounter for preprocedural cardiovascular examination: Secondary | ICD-10-CM

## 2020-09-26 DIAGNOSIS — E785 Hyperlipidemia, unspecified: Secondary | ICD-10-CM

## 2020-09-26 DIAGNOSIS — I10 Essential (primary) hypertension: Secondary | ICD-10-CM | POA: Diagnosis not present

## 2020-09-26 DIAGNOSIS — I700 Atherosclerosis of aorta: Secondary | ICD-10-CM | POA: Diagnosis not present

## 2020-09-26 MED ORDER — ROSUVASTATIN CALCIUM 10 MG PO TABS: 10.0000 mg | ORAL_TABLET | Freq: Every day | ORAL | 11 refills | Status: DC

## 2020-09-26 NOTE — Progress Notes (Signed)
Cardiology Office Note:    Date:  09/26/2020   ID:  Susan Randolph, DOB [birth year], MRN [id number]  PCP:  Josetta Huddle, MD  [facility] Cardiologist:  Candee Furbish, MD  [facility] Electrophysiologist:  None   Referring MD: Josetta Huddle, MD     History of Present Illness:    Susan Randolph is a 74 y.o. female with history of estrogen receptor positive breast cancer followed by Dr. Jana Hakim here for the evaluation of hypertension, review of cardiac prevention.  Dr. Inda Merlin is her primary doctor.  Has family history: Mother AFIB and TIA. Grandfathers had heart issues. Wants prevention. Has Wahlquist watch.   A CT scan of her abdomen pelvis on 07/27/2017 was personally reviewed and does demonstrate evidence of descending aortic atherosclerosis.  Took HTN meds for many years. BP was really low 90's. Lisinopril was stopped by Dr. Hans Eden.   Jan 12th having right knee surgery. Water aerobics 3 days a week. Wants to get back. Now involved with church projects.   Prior C1-2 fusion by Dr. Trenton Gammon  Prior COVID infection.  Last visit notes from Dr. Inda Merlin reviewed.  Never smoked.   Past Medical History:  Diagnosis Date  . Anxiety   . Arthritis   . Breast cancer ([place]) 06/2019   left IMC  . Cancer ([place])    basal cell carcinoma  . GERD (gastroesophageal reflux disease)   . Headache   . History of radiation therapy 10/24/19- 11/18/19   Left Breast 15 fractions of 2.67 Gy to total 40.05 Gy. Left breast boost 5 fractions of 2 Gy to total 10 Gy  . Hypertension   . Personal history of radiation therapy 10/2019  . Rectal bleed 09/2016    Past Surgical History:  Procedure Laterality Date  . BREAST BIOPSY Left 08/2019  . BREAST EXCISIONAL BIOPSY Left 08/2019  . BREAST LUMPECTOMY WITH RADIOACTIVE SEED AND SENTINEL LYMPH NODE BIOPSY Left 08/31/2019   Procedure: LEFT BREAST LUMPECTOMY WITH RADIOACTIVE SEED AND LEFT AXILLARY SENTINEL LYMPH NODE BIOPSY;  Surgeon: Alphonsa Overall, MD;  Location:  [place];  Service: General;  Laterality: Left;  . EYE SURGERY     Corrected drooping eye lids bilateral  . MENISCUS REPAIR Right 03/29/2019  . POSTERIOR CERVICAL FUSION/FORAMINOTOMY N/A 01/11/2018   Procedure: Posterior Cervical Fusion with lateral mass fixation - Cervical one-Cervical two with Iliac Crest bone graft;  Surgeon: Earnie Larsson, MD;  Location: [place];  Service: Neurosurgery;  Laterality: N/A;  . TUBAL LIGATION      Current Medications: Current Meds  Medication Sig  . acetaminophen (TYLENOL) 500 MG tablet Take 500 mg by mouth 2 (two) times daily as needed for mild pain.   Marland Kitchen ALPRAZolam (XANAX) 0.5 MG tablet Take 0.25-0.5 mg by mouth 3 (three) times daily as needed for anxiety.  Marland Kitchen anastrozole (ARIMIDEX) 1 MG tablet Take 1 tablet (1 mg total) by mouth daily.  . bimatoprost (LUMIGAN) 0.01 % SOLN Place 1 drop into both eyes at bedtime.   . Cholecalciferol (VITAMIN D3) 2000 units capsule Take 2,000 Units by mouth daily.   . fluticasone (FLONASE) 50 MCG/ACT nasal spray Place 2 sprays into both nostrils daily as needed for allergies or rhinitis.  Marland Kitchen gabapentin (NEURONTIN) 300 MG capsule Take 1 capsule (300 mg total) by mouth at bedtime.  . Guaifenesin (MUCINEX MAXIMUM STRENGTH) 1200 MG TB12 Take 2 tablets by mouth 2 (two) times daily as needed (cold symptoms).  . loratadine (CLARITIN) 10 MG tablet Take 10  mg by mouth daily.  . Melatonin 10 MG CAPS Take by mouth.  . Multiple Vitamins-Minerals (ALIVE WOMENS 50+) TABS Take 1 tablet by mouth daily.  Marland Kitchen omeprazole (PRILOSEC) 20 MG capsule Take 20 mg by mouth daily.  . traZODone (DESYREL) 50 MG tablet Take 50 mg by mouth at bedtime.     Allergies:   Sulfa antibiotics and Amitriptyline   Social History   Socioeconomic History  . Marital status: Married    Spouse name: Not on file  . Number of children: Not on file  . Years of education: Not on file  . Highest education level: Not on file  Occupational History  . Not on  file  Tobacco Use  . Smoking status: Never Smoker  . Smokeless tobacco: Never Used  Vaping Use  . Vaping Use: Never used  Substance and Sexual Activity  . Alcohol use: Yes    Comment: social  . Drug use: No  . Sexual activity: Not on file  Other Topics Concern  . Not on file  Social History Narrative  . Not on file   Social Determinants of Health   Financial Resource Strain:   . Difficulty of Paying Living Expenses: Not on file  Food Insecurity:   . Worried About Charity fundraiser in the Last Year: Not on file  . Ran Out of Food in the Last Year: Not on file  Transportation Needs:   . Lack of Transportation (Medical): Not on file  . Lack of Transportation (Non-Medical): Not on file  Physical Activity:   . Days of Exercise per Week: Not on file  . Minutes of Exercise per Session: Not on file  Stress:   . Feeling of Stress : Not on file  Social Connections:   . Frequency of Communication with Friends and Family: Not on file  . Frequency of Social Gatherings with Friends and Family: Not on file  . Attends Religious Services: Not on file  . Active Member of Clubs or Organizations: Not on file  . Attends Archivist Meetings: Not on file  . Marital Status: Not on file     Family History: The patient's family history includes Arthritis in her father; Breast cancer in her mother; Cancer in her father and mother; Colon cancer in her maternal uncle; Hypertension in her father and mother; Rheum arthritis in her maternal uncle.  ROS:   Please see the history of present illness.    No fevers chills nausea vomiting syncope bleeding all other systems reviewed and are negative.  EKGs/Labs/Other Studies Reviewed:    The following studies were reviewed today: As above  EKG:  EKG is  ordered today.  The ekg ordered today demonstrates sinus bradycardia 55 with no other abnormalities.  Recent Labs: 08/28/2020: ALT 16; BUN 27; Creatinine, Ser 0.79; Hemoglobin 12.3;  Platelets 175; Potassium 4.3; Sodium 137  Recent Lipid Panel No results found for: CHOL, TRIG, HDL, CHOLHDL, VLDL, LDLCALC, LDLDIRECT   Risk Assessment/Calculations:       Physical Exam:    VS:  BP 120/70 (BP Location: Left Arm, Patient Position: Sitting, Cuff Size: Normal)   Pulse (!) 55   Ht 5\' 4"  (1.626 m)   Wt 139 lb (63 kg)   SpO2 97%   BMI 23.86 kg/m     Wt Readings from Last 3 Encounters:  09/26/20 139 lb (63 kg)  08/28/20 140 lb (63.5 kg)  04/09/20 139 lb 3.2 oz (63.1 kg)     GEN:  Well nourished, well developed in no acute distress HEENT: Normal NECK: No JVD; No carotid bruits LYMPHATICS: No lymphadenopathy CARDIAC: RRR, no murmurs, rubs, gallops RESPIRATORY:  Clear to auscultation without rales, wheezing or rhonchi  ABDOMEN: Soft, non-tender, non-distended MUSCULOSKELETAL:  No edema; No deformity  SKIN: Warm and dry NEUROLOGIC:  Alert and oriented x 3 PSYCHIATRIC:  Normal affect   ASSESSMENT:    1. Aortic atherosclerosis ([place])   2. Essential hypertension   3. Preop cardiovascular exam   4. Hyperlipidemia, unspecified hyperlipidemia type    PLAN:    In order of problems listed above:  Aortic atherosclerosis -Noted on CT scan 2018 abdomen.   -We will start Crestor 10 mg once a day for further protection.  Last LDL 147 triglycerides 135 HDL 49 total cholesterol 221.  ALT 16.  Essential hypertension -No longer in need of lisinopril.  Dr. Inda Merlin has stopped in the past.  Excellent blood pressures.  Continue with diet and exercise.  Right knee osteoarthritis -Has upcoming surgery with Dr. Wynelle Link.  She may proceed from a cardiac perspective with low overall risk.  She is able to complete greater than 4 METS of activity without anginal symptoms.  Prior Covid infection -No significant shortness of breath post COVID.  No chest tightness.  Shared Decision Making/Informed Consent        Medication Adjustments/Labs and Tests Ordered: Current medicines  are reviewed at length with the patient today.  Concerns regarding medicines are outlined above.  Orders Placed This Encounter  Procedures  . ALT  . Lipid Profile  . EKG 12-Lead   Meds ordered this encounter  Medications  . rosuvastatin (CRESTOR) 10 MG tablet    Sig: Take 1 tablet (10 mg total) by mouth daily.    Dispense:  30 tablet    Refill:  11    Patient Instructions  Medication Instructions:  Your physician has recommended you make the following change in your medication:  START Rosuvastatin (Crestor) 10 mg once daily with heaviest meal  *If you need a refill on your cardiac medications before your next appointment, please call your pharmacy*   Lab Work: Your physician recommends that you return for lab work in: 3 months on Wed. March 9 You may come in anytime after 7:30 am You will need to FAST for this appointment - nothing to eat or drink after midnight the night before except water.  If you have labs (blood work) drawn today and your tests are completely normal, you will receive your results only by: Marland Kitchen MyChart Message (if you have MyChart) OR . A paper copy in the mail If you have any lab test that is abnormal or we need to change your treatment, we will call you to review the results.   Testing/Procedures: None Ordered    Follow-Up: At [facility], you and your health needs are our priority.  As part of our continuing mission to provide you with exceptional heart care, we have created designated Provider Care Teams.  These Care Teams include your primary Cardiologist (physician) and Advanced Practice Providers (APPs -  Physician Assistants and Nurse Practitioners) who all work together to provide you with the care you need, when you need it.     Your next appointment:   1 year(s)  The format for your next appointment:   In Person  Provider:   You may see Candee Furbish, MD or one of the following Advanced Practice Providers on your designated Care Team:  Truitt Merle, NP  Cecilie Kicks, NP  Kathyrn Drown, NP        Signed, Candee Furbish, MD  09/26/2020 11:37 AM    [place]

## 2020-09-26 NOTE — Patient Instructions (Signed)
Medication Instructions:  Your physician has recommended you make the following change in your medication:  START Rosuvastatin (Crestor) 10 mg once daily with heaviest meal  *If you need a refill on your cardiac medications before your next appointment, please call your pharmacy*   Lab Work: Your physician recommends that you return for lab work in: 3 months on Wed. March 9 You may come in anytime after 7:30 am You will need to FAST for this appointment - nothing to eat or drink after midnight the night before except water.  If you have labs (blood work) drawn today and your tests are completely normal, you will receive your results only by: Marland Kitchen MyChart Message (if you have MyChart) OR . A paper copy in the mail If you have any lab test that is abnormal or we need to change your treatment, we will call you to review the results.   Testing/Procedures: None Ordered    Follow-Up: At [facility], you and your health needs are our priority.  As part of our continuing mission to provide you with exceptional heart care, we have created designated Provider Care Teams.  These Care Teams include your primary Cardiologist (physician) and Advanced Practice Providers (APPs -  Physician Assistants and Nurse Practitioners) who all work together to provide you with the care you need, when you need it.     Your next appointment:   1 year(s)  The format for your next appointment:   In Person  Provider:   You may see Candee Furbish, MD or one of the following Advanced Practice Providers on your designated Care Team:    Truitt Merle, NP  Cecilie Kicks, NP  Kathyrn Drown, NP

## 2020-09-27 DIAGNOSIS — H401111 Primary open-angle glaucoma, right eye, mild stage: Secondary | ICD-10-CM | POA: Diagnosis not present

## 2020-10-22 DIAGNOSIS — H401121 Primary open-angle glaucoma, left eye, mild stage: Secondary | ICD-10-CM | POA: Diagnosis not present

## 2020-10-23 ENCOUNTER — Encounter (HOSPITAL_COMMUNITY): Payer: Self-pay

## 2020-10-23 ENCOUNTER — Encounter (HOSPITAL_COMMUNITY)
Admission: RE | Admit: 2020-10-23 | Discharge: 2020-10-23 | Disposition: A | Discharge status: Home / Self Care | Payer: Medicare PPO | Source: Ambulatory Visit | Attending: Orthopedic Surgery | Admitting: Orthopedic Surgery

## 2020-10-23 ENCOUNTER — Other Ambulatory Visit: Payer: Self-pay

## 2020-10-23 DIAGNOSIS — Z01812 Encounter for preprocedural laboratory examination: Secondary | ICD-10-CM | POA: Insufficient documentation

## 2020-10-23 LAB — COMPREHENSIVE METABOLIC PANEL
ALT: 22 U/L (ref 0–44)
AST: 20 U/L (ref 15–41)
Albumin: 4.3 g/dL (ref 3.5–5.0)
Alkaline Phosphatase: 56 U/L (ref 38–126)
Anion gap: 7 (ref 5–15)
BUN: 17 mg/dL (ref 8–23)
CO2: 29 mmol/L (ref 22–32)
Calcium: 10.3 mg/dL (ref 8.9–10.3)
Chloride: 103 mmol/L (ref 98–111)
Creatinine, Ser: 0.67 mg/dL (ref 0.44–1.00)
GFR, Estimated: >60 mL/min (ref >60)
Glucose, Bld: 99 mg/dL (ref 70–99)
Potassium: 4.9 mmol/L (ref 3.5–5.1)
Sodium: 139 mmol/L (ref 135–145)
Total Bilirubin: 0.6 mg/dL (ref 0.3–1.2)
Total Protein: 6.7 g/dL (ref 6.5–8.1)

## 2020-10-23 LAB — SURGICAL PCR SCREEN
MRSA, PCR: NEGATIVE
Staphylococcus aureus: NEGATIVE

## 2020-10-23 LAB — CBC
HCT: 37.6 % (ref 36.0–46.0)
Hemoglobin: 12.6 g/dL (ref 12.0–15.0)
MCH: 31 pg (ref 26.0–34.0)
MCHC: 33.5 g/dL (ref 30.0–36.0)
MCV: 92.4 fL (ref 80.0–100.0)
Platelets: 169 10*3/uL (ref 150–400)
RBC: 4.07 MIL/uL (ref 3.87–5.11)
RDW: 12.1 % (ref 11.5–15.5)
WBC: 5.2 10*3/uL (ref 4.0–10.5)
nRBC: 0 % (ref 0.0–0.2)

## 2020-10-23 LAB — PROTIME-INR
INR: 1 (ref 0.8–1.2)
Prothrombin Time: 13.1 seconds (ref 11.4–15.2)

## 2020-10-23 LAB — APTT: aPTT: 37 seconds — ABNORMAL HIGH (ref 24–36)

## 2020-10-23 NOTE — Patient Instructions (Addendum)
DUE TO COVID-19 ONLY ONE VISITOR IS ALLOWED TO COME WITH YOU AND STAY IN THE WAITING ROOM ONLY DURING PRE OP AND PROCEDURE DAY OF SURGERY. THE 1 VISITOR  MAY VISIT WITH YOU AFTER SURGERY IN YOUR PRIVATE ROOM DURING VISITING HOURS ONLY!  YOU NEED TO HAVE A COVID 19 TEST ON__1/8_____ @__11 :45_____, THIS TEST MUST BE DONE BEFORE SURGERY,  COVID TESTING SITE [address] [place] [address], IT IS ON THE RIGHT GOING OUT [address] APPROXIMATELY  2 MINUTES PAST [employer] ON THE RIGHT. ONCE YOUR COVID TEST IS COMPLETED,  PLEASE BEGIN THE QUARANTINE INSTRUCTIONS AS OUTLINED IN YOUR HANDOUT.                Susan Randolph    Your procedure is scheduled on: 10/31/20   Report to [facility] Main  Entrance   Report to admitting at 11:00 AM     Call this number if you have problems the morning of surgery [place], NO CHEWING GUM [place].  No food after midnight.    You may have clear liquid until 10:00 AM.    CLEAR LIQUID DIET   Foods Allowed                                                                     Foods Excluded  Coffee and tea, regular and decaf                             liquids that you cannot  Plain Jell-O any favor except red or purple                                           see through such as: Fruit ices (not with fruit pulp)                                     milk, soups, orange juice  Iced Popsicles                                    All solid food Carbonated beverages, regular and diet                                    Cranberry, grape and Mazzeo juices Sports drinks like Gatorade Lightly seasoned clear broth or consume(fat free) Sugar, honey syrup   At 9:30 AM drink pre surgery drink.   Nothing by mouth after 10:00 AM.    Take these medicines the morning of surgery with A SIP OF WATER: Omeprazole, Anastrozole                                 You  may not have any  metal on your body including hair pins and              piercings  Do not wear jewelry, make-up, lotions, powders or perfumes, deodorant             Do not wear nail polish on your fingernails.  Do not shave  48 hours prior to surgery.              Do not bring valuables to the hospital. [facility] IS NOT             RESPONSIBLE   FOR VALUABLES.  Contacts, dentures or bridgework may not be worn into surgery.      Patients discharged the day of surgery will not be allowed to drive home.   IF YOU ARE HAVING SURGERY AND GOING HOME THE SAME DAY, YOU MUST HAVE AN ADULT TO DRIVE YOU HOME AND BE WITH YOU FOR 24 HOURS.   YOU MAY GO HOME BY TAXI OR UBER OR ORTHERWISE, BUT AN ADULT MUST ACCOMPANY YOU HOME AND STAY WITH YOU FOR 24 HOURS.  Name and phone number of your driver:  Special Instructions: N/A              Please read over the following fact sheets you were given: _____________________________________________________________________             [facility] - Preparing for Surgery Before surgery, you can play an important role.   Because skin is not sterile, your skin needs to be as free of germs as possible.   You can reduce the number of germs on your skin by washing with CHG (chlorahexidine gluconate) soap before surgery .  CHG is an antiseptic cleaner which kills germs and bonds with the skin to continue killing germs even after washing. Please DO NOT use if you have an allergy to CHG or antibacterial soaps.   If your skin becomes reddened/irritated stop using the CHG and inform your nurse when you arrive at Short Stay. Do not shave (including legs and underarms) for at least 48 hours prior to the first CHG shower.  . Please follow these instructions carefully:  1.  Shower with CHG Soap the night before surgery and the  morning of Surgery.  2.  If you choose to wash your hair, wash your hair first as usual with your  normal  shampoo.  3.  After you shampoo,  rinse your hair and body thoroughly to remove the  shampoo.                                        4.  Use CHG as you would any other liquid soap.  You can apply chg directly  to the skin and wash                       Gently with a scrungie or clean washcloth.  5.  Apply the CHG Soap to your body ONLY FROM THE NECK DOWN.   Do not use on face/ open                           Wound or open sores. Avoid contact with eyes, ears mouth and genitals (private parts).  Wash face,  Genitals (private parts) with your normal soap.             6.  Wash thoroughly, paying special attention to the area where your surgery  will be performed.  7.  Thoroughly rinse your body with warm water from the neck down.  8.  DO NOT shower/wash with your normal soap after using and rinsing off  the CHG Soap.             9.  Pat yourself dry with a clean towel.            10.  Wear clean pajamas.            11.  Place clean sheets on your bed the night of your first shower and do not  sleep with pets. Day of Surgery : Do not apply any lotions/deodorants the morning of surgery.  Please wear clean clothes to the hospital/surgery center.  FAILURE TO FOLLOW THESE INSTRUCTIONS MAY RESULT IN THE CANCELLATION OF YOUR SURGERY PATIENT SIGNATURE_________________________________  NURSE SIGNATURE__________________________________  ________________________________________________________________________   Susan Randolph  An incentive spirometer is a tool that can help keep your lungs clear and active. This tool measures how well you are filling your lungs with each breath. Taking long deep breaths may help reverse or decrease the chance of developing breathing (pulmonary) problems (especially infection) following:  A long period of time when you are unable to move or be active. BEFORE THE PROCEDURE   If the spirometer includes an indicator to show your best effort, your nurse or respiratory therapist  will set it to a desired goal.  If possible, sit up straight or lean slightly forward. Try not to slouch.  Hold the incentive spirometer in an upright position. INSTRUCTIONS FOR USE  1. Sit on the edge of your bed if possible, or sit up as far as you can in bed or on a chair. 2. Hold the incentive spirometer in an upright position. 3. Breathe out normally. 4. Place the mouthpiece in your mouth and seal your lips tightly around it. 5. Breathe in slowly and as deeply as possible, raising the piston or the ball toward the top of the column. 6. Hold your breath for 3-5 seconds or for as long as possible. Allow the piston or ball to fall to the bottom of the column. 7. Remove the mouthpiece from your mouth and breathe out normally. 8. Rest for a few seconds and repeat Steps 1 through 7 at least 10 times every 1-2 hours when you are awake. Take your time and take a few normal breaths between deep breaths. 9. The spirometer may include an indicator to show your best effort. Use the indicator as a goal to work toward during each repetition. 10. After each set of 10 deep breaths, practice coughing to be sure your lungs are clear. If you have an incision (the cut made at the time of surgery), support your incision when coughing by placing a pillow or rolled up towels firmly against it. Once you are able to get out of bed, walk around indoors and cough well. You may stop using the incentive spirometer when instructed by your caregiver.  RISKS AND COMPLICATIONS  Take your time so you do not get dizzy or light-headed.  If you are in pain, you may need to take or ask for pain medication before doing incentive spirometry. It is harder to take a deep breath if you are having pain. AFTER USE  Rest and breathe slowly and easily.  It can be helpful to keep track of a log of your progress. Your caregiver can provide you with a simple table to help with this. If you are using the spirometer at home, follow  these instructions: [place] IF:   You are having difficultly using the spirometer.  You have trouble using the spirometer as often as instructed.  Your pain medication is not giving enough relief while using the spirometer.  You develop fever of 100.5 F (38.1 C) or higher. SEEK IMMEDIATE MEDICAL CARE IF:   You cough up bloody sputum that had not been present before.  You develop fever of 102 F (38.9 C) or greater.  You develop worsening pain at or near the incision site. MAKE SURE YOU:   Understand these instructions.  Will watch your condition.  Will get help right away if you are not doing well or get worse. Document Released: 02/16/2007 Document Revised: 12/29/2011 Document Reviewed: 04/19/2007 [facility] Patient Information 2014 [place], [place].   ________________________________________________________________________

## 2020-10-23 NOTE — Progress Notes (Signed)
COVID Vaccine Completed:Yes Date COVID Vaccine completed:01/01/20- Booster 07/2020 COVID vaccine manufacturer: Pfizer      PCP - Dr. Wende Neighbors Cardiologist - Dr. Michele Rockers  Chest x-ray - 12/07/19-epic EKG - 09/26/20 -epic Stress Test - no ECHO - no Cardiac Cath - NA Pacemaker/ICD device last checked:NA  Sleep Study - no CPAP -   Fasting Blood Sugar - NA Checks Blood Sugar _____ times a day  Blood Thinner Instructions:NA Aspirin Instructions: Last Dose:  Anesthesia review:   Patient denies shortness of breath, fever, cough and chest pain at PAT appointment  yes Patient verbalized understanding of instructions that were given to them at the PAT appointment. Patient was also instructed that they will need to review over the PAT instructions again at home before surgery. Yes Pt is able to climb 2-3 flights of stairs, do housework and ADLs without SOB. She had neck surgery with rods and a fusion that resulted in limited ROM.

## 2020-10-24 NOTE — H&P (Signed)
ADMISSION H&P  Patient is being admitted for right unicompartmental knee arthroplasty.  Subjective:  Chief Complaint: Right knee pain.  HPI: Susan Randolph, 75 y.o. female has a history of pain and functional disability in the right knee due to arthritis and has failed non-surgical conservative treatments for greater than 12 weeks to include corticosteriod injections, viscosupplementation injections and activity modification. Onset of symptoms was gradual, starting several years ago with gradually worsening course since that time. The patient noted no past surgery on the right knee.  Patient currently rates pain in the right knee at 7 out of 10 with activity. Patient has worsening of pain with activity and weight bearing, pain that interferes with activities of daily living and crepitus. Patient has evidence of isolated medial compartment bone-on-bone disease with no tibial subluxation. There is no lateral or patellofemoral disease by imaging studies. There is no active infection.  Patient Active Problem List   Diagnosis Date Noted  . Malignant neoplasm of upper-outer quadrant of left breast in female, estrogen receptor positive ([place]) 08/12/2019  . Osteoarthritis of occipito-atlanto-axial spinal region 01/11/2018  . Polyarthralgia 12/29/2016  . DJD (degenerative joint disease), cervical 12/29/2016  . Spondylosis of lumbar region without myelopathy or radiculopathy 12/29/2016  . Primary osteoarthritis of both hands 12/29/2016  . Essential hypertension 12/29/2016  . Restless leg syndrome 12/29/2016  . Allergic rhinitis due to allergen 12/29/2016  . History of vitamin D deficiency 12/29/2016  . History of basal cell carcinoma 12/29/2016  . Gastroesophageal reflux disease without esophagitis 12/29/2016  . History of anxiety 12/29/2016    Past Medical History:  Diagnosis Date  . Anxiety   . Arthritis   . Breast cancer ([place]) 06/2019   left IMC  . Cancer ([place])    basal cell carcinoma  .  GERD (gastroesophageal reflux disease)   . History of radiation therapy 10/24/19- 11/18/19   Left Breast 15 fractions of 2.67 Gy to total 40.05 Gy. Left breast boost 5 fractions of 2 Gy to total 10 Gy  . Personal history of radiation therapy 10/2019  . Rectal bleed 09/2016    Past Surgical History:  Procedure Laterality Date  . BREAST BIOPSY Left 08/2019  . BREAST EXCISIONAL BIOPSY Left 08/2019  . BREAST LUMPECTOMY WITH RADIOACTIVE SEED AND SENTINEL LYMPH NODE BIOPSY Left 08/31/2019   Procedure: LEFT BREAST LUMPECTOMY WITH RADIOACTIVE SEED AND LEFT AXILLARY SENTINEL LYMPH NODE BIOPSY;  Surgeon: Alphonsa Overall, MD;  Location: [facility];  Service: General;  Laterality: Left;  . EYE SURGERY     Corrected drooping eye lids bilateral  . MENISCUS REPAIR Right 03/29/2019  . POSTERIOR CERVICAL FUSION/FORAMINOTOMY N/A 01/11/2018   Procedure: Posterior Cervical Fusion with lateral mass fixation - Cervical one-Cervical two with Iliac Crest bone graft;  Surgeon: Earnie Larsson, MD;  Location: [facility];  Service: Neurosurgery;  Laterality: N/A;  . TUBAL LIGATION      Prior to Admission medications   Medication Sig Start Date End Date Taking? Authorizing Provider  acetaminophen (TYLENOL) 500 MG tablet Take 1,000 mg by mouth 2 (two) times daily as needed for mild pain.   Yes [provider]  ALPRAZolam (XANAX) 0.25 MG tablet Take 0.25 mg by mouth daily as needed for anxiety.   Yes [provider]  anastrozole (ARIMIDEX) 1 MG tablet Take 1 tablet (1 mg total) by mouth daily. 08/28/20  Yes Magrinat, Virgie Dad, MD  bimatoprost (LUMIGAN) 0.01 % SOLN Place 1 drop into both eyes at bedtime.  Yes [provider]  bisacodyl (BISACODYL) 5 MG EC tablet Take 5 mg by mouth daily as needed for moderate constipation.   Yes [provider]  brimonidine-timolol (COMBIGAN) 0.2-0.5 % ophthalmic solution Place 1 drop into both eyes 2 (two) times daily.   Yes [provider]  Calcium Polycarbophil (FIBER-CAPS PO) Take 2 capsules by mouth daily.   Yes [provider]  Cholecalciferol (VITAMIN D3) 2000 units capsule Take 4,000 Units by mouth daily.   Yes [provider]  fluticasone (FLONASE) 50 MCG/ACT nasal spray Place 2 sprays into both nostrils daily as needed for allergies or rhinitis.   Yes [provider]  gabapentin (NEURONTIN) 300 MG capsule Take 1 capsule (300 mg total) by mouth at bedtime. 08/28/20  Yes Magrinat, Virgie Dad, MD  loperamide (IMODIUM A-D) 2 MG tablet Take 2-4 mg by mouth 4 (four) times daily as needed for diarrhea or loose stools.   Yes [provider]  loratadine (CLARITIN) 10 MG tablet Take 10 mg by mouth daily.   Yes [provider]  Magnesium 250 MG TABS Take 250 mg by mouth daily.   Yes [provider]  Misc Natural Products (GLUCOSAMINE CHOND DOUBLE STR) TABS Take 2 tablets by mouth daily.   Yes [provider]  Multiple Vitamins-Minerals (ALIVE WOMENS 50+) TABS Take 1 tablet by mouth daily.   Yes [provider]  omeprazole (PRILOSEC) 20 MG capsule Take 20 mg by mouth daily.   Yes [provider]  rosuvastatin (CRESTOR) 10 MG tablet Take 1 tablet (10 mg total) by mouth daily. 09/26/20  Yes Jerline Pain, MD  traZODone (DESYREL) 50 MG tablet Take 50 mg by mouth at bedtime.   Yes [provider]  naproxen sodium (ALEVE) 220 MG tablet Take 220 mg by mouth daily as needed (pain).    [provider]    Allergies  Allergen Reactions  . Sulfa Antibiotics Other (See Comments)    Doesn't remember, happened in childhood  . Amitriptyline Other (See Comments)    tremors    Social History   Socioeconomic History  . Marital status: Married    Spouse name: Not on file  . Number of children: Not on file  . Years of education: Not on file  . Highest education level: Not on file  Occupational History  . Not on file  Tobacco Use  . Smoking  status: Never Smoker  . Smokeless tobacco: Never Used  Vaping Use  . Vaping Use: Never used  Substance and Sexual Activity  . Alcohol use: Yes    Comment: social  . Drug use: No  . Sexual activity: Not on file  Other Topics Concern  . Not on file  Social History Narrative  . Not on file   Social Determinants of Health   Financial Resource Strain: Not on file  Food Insecurity: Not on file  Transportation Needs: Not on file  Physical Activity: Not on file  Stress: Not on file  Social Connections: Not on file  Intimate Partner Violence: Not on file    Tobacco Use: Low Risk   . Smoking Tobacco Use: Never Smoker  . Smokeless Tobacco Use: Never Used   Social History   Substance and Sexual Activity  Alcohol Use Yes   Comment: social    Family History  Problem Relation Age of Onset  . Cancer Mother        breast cancer/endometrial cancer  . Hypertension Mother   .  Breast cancer Mother   . Hypertension Father   . Cancer Father        Glioblastoma  . Arthritis Father   . Rheum arthritis Maternal Uncle   . Colon cancer Maternal Uncle     Review of Systems  Constitutional: Negative for chills and fever.  HENT: Negative for congestion, sore throat and tinnitus.   Eyes: Negative for double vision, photophobia and pain.  Respiratory: Negative for cough, shortness of breath and wheezing.   Cardiovascular: Negative for chest pain, palpitations and orthopnea.  Gastrointestinal: Negative for heartburn, nausea and vomiting.  Genitourinary: Negative for dysuria, frequency and urgency.  Musculoskeletal: Positive for joint pain.  Neurological: Negative for dizziness, weakness and headaches.    Objective:  Physical Exam: Well nourished and well developed.  General: Alert and oriented x3, cooperative and pleasant, no acute distress.  Head: normocephalic, atraumatic, neck supple.  Eyes: EOMI.  Respiratory: breath sounds clear in all fields, no wheezing, rales, or  rhonchi. Cardiovascular: Regular rate and rhythm, no murmurs, gallops or rubs.  Abdomen: non-tender to palpation and soft, normoactive bowel sounds. Musculoskeletal:  Right Knee Exam:  No effusion present. No swelling present.  The range of motion is: 0 to 135 degrees.  No crepitus on range of motion of the knee.  Positive medial joint line tenderness.  No lateral joint line tenderness.  The knee is stable.   Calves soft and nontender. Motor function intact in LE. Strength 5/5 LE bilaterally. Neuro: Distal pulses 2+. Sensation to light touch intact in LE.  Imaging Review Plain radiographs demonstrate severe degenerative joint disease of the right knee. The overall alignment is neutral. The bone quality appears to be adequate for age and reported activity level.  Assessment/Plan:  End stage arthritis, right knee   The patient history, physical examination, clinical judgment of the provider and imaging studies are consistent with end stage degenerative joint disease of the right knee and total knee arthroplasty is deemed medically necessary. The treatment options including medical management, injection therapy arthroscopy and arthroplasty were discussed at length. The risks and benefits of total knee arthroplasty were presented and reviewed. The risks due to aseptic loosening, infection, stiffness, patella tracking problems, thromboembolic complications and other imponderables were discussed. The patient acknowledged the explanation, agreed to proceed with the plan and consent was signed. Patient is being admitted for inpatient treatment for surgery, pain control, PT, OT, prophylactic antibiotics, VTE prophylaxis, progressive ambulation and ADLs and discharge planning. The patient is planning to be discharged home.   Patient's anticipated LOS is less than 2 midnights, meeting these requirements: - Younger than 74 - Lives within 1 hour of care - Has a competent adult at home to recover with  post-op recover - NO history of  - Chronic pain requiring opiods  - Diabetes  - Coronary Artery Disease  - Heart failure  - Heart attack  - Stroke  - DVT/VTE  - Cardiac arrhythmia  - Respiratory Failure/COPD  - Renal failure  - Anemia  - Advanced Liver disease  Therapy Plans: Outpatient therapy at [facility] Disposition: Home with husband Planned DVT Prophylaxis: Xarelto 10 mg QD DME Needed: Dan Humphreys, 3-in-1 PCP: Marden Noble, MD (clearance received) Cardiologist: Donato Schultz, MD (clearance received) TXA: IV Allergies: Amitriptyline, sulfa  Anesthesia Concerns: None BMI: 24.9 Last HgbA1c: Not diabetic Pharmacy: [facility] ([address])  Other: - Hx breast CA  - NO IV STICKS/BP LEFT ARM  - Patient was instructed on what medications to stop prior to surgery. -  Follow-up visit in 2 weeks with Dr. Wynelle Link - Begin physical therapy following surgery - Pre-operative lab work as pre-surgical testing - Prescriptions will be provided in hospital at time of discharge  Theresa Duty, PA-C Orthopedic Surgery [facility] [place]

## 2020-10-27 ENCOUNTER — Other Ambulatory Visit (HOSPITAL_COMMUNITY)
Admission: RE | Admit: 2020-10-27 | Discharge: 2020-10-27 | Disposition: A | Discharge status: Home / Self Care | Payer: Medicare PPO | Source: Ambulatory Visit | Attending: Orthopedic Surgery | Admitting: Orthopedic Surgery

## 2020-10-27 DIAGNOSIS — Z01812 Encounter for preprocedural laboratory examination: Secondary | ICD-10-CM | POA: Diagnosis not present

## 2020-10-27 DIAGNOSIS — Z20822 Contact with and (suspected) exposure to covid-19: Secondary | ICD-10-CM | POA: Diagnosis not present

## 2020-10-27 LAB — SARS CORONAVIRUS 2 (TAT 6-24 HRS): SARS Coronavirus 2: NEGATIVE

## 2020-10-30 MED ORDER — BUPIVACAINE LIPOSOME 1.3 % IJ SUSP
20.0000 mL | Freq: Once | INTRAMUSCULAR | Status: DC
  Filled 2020-10-30: qty 20

## 2020-10-30 NOTE — Anesthesia Preprocedure Evaluation (Addendum)
Anesthesia Evaluation  Patient identified by MRN, date of birth, ID band Patient awake    Reviewed: Allergy & Precautions, NPO status , Patient's Chart, lab work & pertinent test results  Airway Mallampati: II  TM Distance: >3 FB Neck ROM: Full    Dental no notable dental hx. (+) Dental Advisory Given, Teeth Intact   Pulmonary neg pulmonary ROS,    Pulmonary exam normal breath sounds clear to auscultation       Cardiovascular hypertension, Pt. on medications Normal cardiovascular exam Rhythm:Regular Rate:Normal     Neuro/Psych PSYCHIATRIC DISORDERS Anxiety negative neurological ROS     GI/Hepatic Neg liver ROS, GERD  Controlled and Medicated,  Endo/Other  negative endocrine ROS  Renal/GU negative Renal ROS  negative genitourinary   Musculoskeletal  (+) Arthritis ,   Abdominal   Peds  Hematology negative hematology ROS (+)   Anesthesia Other Findings   Reproductive/Obstetrics                            Anesthesia Physical Anesthesia Plan  ASA: II  Anesthesia Plan: Spinal and Regional   Post-op Pain Management:  Regional for Post-op pain   Induction:   PONV Risk Score and Plan: Treatment may vary due to age or medical condition, Propofol infusion, Midazolam, Ondansetron and Dexamethasone  Airway Management Planned: Natural Airway  Additional Equipment:   Intra-op Plan:   Post-operative Plan:   Informed Consent: I have reviewed the patients History and Physical, chart, labs and discussed the procedure including the risks, benefits and alternatives for the proposed anesthesia with the patient or authorized representative who has indicated his/her understanding and acceptance.     Dental advisory given  Plan Discussed with: CRNA  Anesthesia Plan Comments:         Anesthesia Quick Evaluation

## 2020-10-31 ENCOUNTER — Ambulatory Visit (HOSPITAL_COMMUNITY): Payer: Medicare PPO | Admitting: Anesthesiology

## 2020-10-31 ENCOUNTER — Ambulatory Visit (HOSPITAL_COMMUNITY): Payer: Medicare PPO | Admitting: Physician Assistant

## 2020-10-31 ENCOUNTER — Encounter (HOSPITAL_COMMUNITY): Payer: Self-pay | Admitting: Orthopedic Surgery

## 2020-10-31 ENCOUNTER — Ambulatory Visit (HOSPITAL_COMMUNITY)
Admission: RE | Admit: 2020-10-31 | Discharge: 2020-10-31 | Disposition: A | Discharge status: Home / Self Care | Payer: Medicare PPO | Attending: Orthopedic Surgery | Admitting: Orthopedic Surgery

## 2020-10-31 ENCOUNTER — Encounter (HOSPITAL_COMMUNITY)
Admission: RE | Disposition: A | Discharge status: Home / Self Care | Payer: Self-pay | Source: Home / Self Care | Attending: Orthopedic Surgery

## 2020-10-31 ENCOUNTER — Other Ambulatory Visit: Payer: Self-pay

## 2020-10-31 DIAGNOSIS — M1710 Unilateral primary osteoarthritis, unspecified knee: Secondary | ICD-10-CM | POA: Diagnosis present

## 2020-10-31 DIAGNOSIS — Z79899 Other long term (current) drug therapy: Secondary | ICD-10-CM | POA: Insufficient documentation

## 2020-10-31 DIAGNOSIS — Z888 Allergy status to other drugs, medicaments and biological substances status: Secondary | ICD-10-CM | POA: Diagnosis not present

## 2020-10-31 DIAGNOSIS — M1711 Unilateral primary osteoarthritis, right knee: Secondary | ICD-10-CM | POA: Diagnosis not present

## 2020-10-31 DIAGNOSIS — M25761 Osteophyte, right knee: Secondary | ICD-10-CM | POA: Insufficient documentation

## 2020-10-31 DIAGNOSIS — Z79811 Long term (current) use of aromatase inhibitors: Secondary | ICD-10-CM | POA: Diagnosis not present

## 2020-10-31 DIAGNOSIS — M1611 Unilateral primary osteoarthritis, right hip: Secondary | ICD-10-CM | POA: Diagnosis not present

## 2020-10-31 DIAGNOSIS — I10 Essential (primary) hypertension: Secondary | ICD-10-CM | POA: Diagnosis not present

## 2020-10-31 DIAGNOSIS — G8918 Other acute postprocedural pain: Secondary | ICD-10-CM | POA: Diagnosis not present

## 2020-10-31 DIAGNOSIS — M179 Osteoarthritis of knee, unspecified: Secondary | ICD-10-CM | POA: Diagnosis present

## 2020-10-31 DIAGNOSIS — F419 Anxiety disorder, unspecified: Secondary | ICD-10-CM | POA: Diagnosis not present

## 2020-10-31 DIAGNOSIS — Z882 Allergy status to sulfonamides status: Secondary | ICD-10-CM | POA: Insufficient documentation

## 2020-10-31 HISTORY — PX: PARTIAL KNEE ARTHROPLASTY: SHX2174

## 2020-10-31 LAB — TYPE AND SCREEN
ABO/RH(D): O NEG
Antibody Screen: NEGATIVE

## 2020-10-31 SURGERY — ARTHROPLASTY, KNEE, UNICOMPARTMENTAL
Anesthesia: Regional | Site: Knee | Laterality: Right

## 2020-10-31 MED ORDER — LACTATED RINGERS IV BOLUS
500.0000 mL | Freq: Once | INTRAVENOUS | Status: AC
  Administered 2020-10-31: 500 mL via INTRAVENOUS

## 2020-10-31 MED ORDER — LACTATED RINGERS IV SOLN: INTRAVENOUS | Status: DC

## 2020-10-31 MED ORDER — POVIDONE-IODINE 10 % EX SWAB
2.0000 "application " | Freq: Once | CUTANEOUS | Status: AC
  Administered 2020-10-31: 2 via TOPICAL

## 2020-10-31 MED ORDER — ACETAMINOPHEN 10 MG/ML IV SOLN
1000.0000 mg | Freq: Four times a day (QID) | INTRAVENOUS | Status: DC
  Administered 2020-10-31: 1000 mg via INTRAVENOUS

## 2020-10-31 MED ORDER — SODIUM CHLORIDE 0.9 % IR SOLN
Status: DC | PRN
  Administered 2020-10-31: 3000 mL

## 2020-10-31 MED ORDER — PROPOFOL 1000 MG/100ML IV EMUL
INTRAVENOUS | Status: AC
  Filled 2020-10-31: qty 100

## 2020-10-31 MED ORDER — CHLORHEXIDINE GLUCONATE 0.12 % MT SOLN
15.0000 mL | Freq: Once | OROMUCOSAL | Status: AC
  Administered 2020-10-31: 15 mL via OROMUCOSAL

## 2020-10-31 MED ORDER — RIVAROXABAN 10 MG PO TABS: 10.0000 mg | ORAL_TABLET | Freq: Every day | ORAL | 0 refills | Status: DC

## 2020-10-31 MED ORDER — PROPOFOL 500 MG/50ML IV EMUL
INTRAVENOUS | Status: DC | PRN
  Administered 2020-10-31: 25 ug/kg/min via INTRAVENOUS

## 2020-10-31 MED ORDER — FENTANYL CITRATE (PF) 100 MCG/2ML IJ SOLN
INTRAMUSCULAR | Status: AC
  Administered 2020-10-31: 50 ug via INTRAVENOUS
  Filled 2020-10-31: qty 2

## 2020-10-31 MED ORDER — SODIUM CHLORIDE (PF) 0.9 % IJ SOLN
INTRAMUSCULAR | Status: DC | PRN
  Administered 2020-10-31: 30 mL via INTRAVENOUS

## 2020-10-31 MED ORDER — CEFAZOLIN SODIUM-DEXTROSE 2-4 GM/100ML-% IV SOLN
INTRAVENOUS | Status: AC
  Filled 2020-10-31: qty 100

## 2020-10-31 MED ORDER — FENTANYL CITRATE (PF) 100 MCG/2ML IJ SOLN: 25.0000 ug | INTRAMUSCULAR | Status: DC | PRN

## 2020-10-31 MED ORDER — OXYCODONE HCL 5 MG PO TABS
ORAL_TABLET | ORAL | Status: AC
  Filled 2020-10-31: qty 2

## 2020-10-31 MED ORDER — TRAMADOL HCL 50 MG PO TABS: 50.0000 mg | ORAL_TABLET | Freq: Four times a day (QID) | ORAL | Status: DC | PRN

## 2020-10-31 MED ORDER — OXYCODONE HCL 5 MG PO TABS: 5.0000 mg | ORAL_TABLET | Freq: Four times a day (QID) | ORAL | 0 refills | Status: AC | PRN

## 2020-10-31 MED ORDER — STERILE WATER FOR IRRIGATION IR SOLN
Status: DC | PRN
  Administered 2020-10-31: 2000 mL

## 2020-10-31 MED ORDER — ONDANSETRON HCL 4 MG/2ML IJ SOLN
INTRAMUSCULAR | Status: DC | PRN
  Administered 2020-10-31: 4 mg via INTRAVENOUS

## 2020-10-31 MED ORDER — CEFAZOLIN SODIUM-DEXTROSE 2-4 GM/100ML-% IV SOLN: 2.0000 g | Freq: Four times a day (QID) | INTRAVENOUS | Status: DC

## 2020-10-31 MED ORDER — LACTATED RINGERS IV BOLUS
250.0000 mL | Freq: Once | INTRAVENOUS | Status: AC
  Administered 2020-10-31: 250 mL via INTRAVENOUS

## 2020-10-31 MED ORDER — ACETAMINOPHEN 10 MG/ML IV SOLN
INTRAVENOUS | Status: AC
  Filled 2020-10-31: qty 100

## 2020-10-31 MED ORDER — METHOCARBAMOL 500 MG PO TABS: 500.0000 mg | ORAL_TABLET | Freq: Four times a day (QID) | ORAL | 0 refills | Status: DC | PRN

## 2020-10-31 MED ORDER — METHOCARBAMOL 500 MG PO TABS: 500.0000 mg | ORAL_TABLET | Freq: Four times a day (QID) | ORAL | Status: DC | PRN

## 2020-10-31 MED ORDER — CEFAZOLIN SODIUM-DEXTROSE 2-4 GM/100ML-% IV SOLN
2.0000 g | INTRAVENOUS | Status: AC
  Administered 2020-10-31: 2 g via INTRAVENOUS

## 2020-10-31 MED ORDER — ORAL CARE MOUTH RINSE: 15.0000 mL | Freq: Once | OROMUCOSAL | Status: AC

## 2020-10-31 MED ORDER — MIDAZOLAM HCL 2 MG/2ML IJ SOLN
INTRAMUSCULAR | Status: AC
  Administered 2020-10-31: 1 mg via INTRAVENOUS
  Filled 2020-10-31: qty 2

## 2020-10-31 MED ORDER — FENTANYL CITRATE (PF) 100 MCG/2ML IJ SOLN: 50.0000 ug | INTRAMUSCULAR | Status: AC

## 2020-10-31 MED ORDER — DEXAMETHASONE SODIUM PHOSPHATE 10 MG/ML IJ SOLN
8.0000 mg | Freq: Once | INTRAMUSCULAR | Status: AC
  Administered 2020-10-31: 8 mg via INTRAVENOUS

## 2020-10-31 MED ORDER — PROPOFOL 10 MG/ML IV BOLUS
INTRAVENOUS | Status: AC
  Filled 2020-10-31: qty 20

## 2020-10-31 MED ORDER — OXYCODONE HCL 5 MG PO TABS
5.0000 mg | ORAL_TABLET | ORAL | Status: DC | PRN
  Administered 2020-10-31: 10 mg via ORAL

## 2020-10-31 MED ORDER — BUPIVACAINE LIPOSOME 1.3 % IJ SUSP
INTRAMUSCULAR | Status: DC | PRN
  Administered 2020-10-31: 20 mL

## 2020-10-31 MED ORDER — SODIUM CHLORIDE (PF) 0.9 % IJ SOLN
INTRAMUSCULAR | Status: AC
  Filled 2020-10-31: qty 30

## 2020-10-31 MED ORDER — 0.9 % SODIUM CHLORIDE (POUR BTL) OPTIME
TOPICAL | Status: DC | PRN
  Administered 2020-10-31: 1000 mL

## 2020-10-31 MED ORDER — MEPIVACAINE HCL (PF) 2 % IJ SOLN
INTRAMUSCULAR | Status: DC | PRN
  Administered 2020-10-31: 2.5 mL via EPIDURAL

## 2020-10-31 MED ORDER — MIDAZOLAM HCL 2 MG/2ML IJ SOLN: 1.0000 mg | INTRAMUSCULAR | Status: AC

## 2020-10-31 MED ORDER — PROPOFOL 10 MG/ML IV BOLUS
INTRAVENOUS | Status: DC | PRN
  Administered 2020-10-31: 30 mg via INTRAVENOUS

## 2020-10-31 MED ORDER — TRAMADOL HCL 50 MG PO TABS: 50.0000 mg | ORAL_TABLET | Freq: Four times a day (QID) | ORAL | 0 refills | Status: AC | PRN

## 2020-10-31 MED ORDER — TRANEXAMIC ACID-NACL 1000-0.7 MG/100ML-% IV SOLN
1000.0000 mg | INTRAVENOUS | Status: AC
  Administered 2020-10-31: 1000 mg via INTRAVENOUS

## 2020-10-31 MED ORDER — ROPIVACAINE HCL 5 MG/ML IJ SOLN
INTRAMUSCULAR | Status: DC | PRN
  Administered 2020-10-31: 20 mL via PERINEURAL

## 2020-10-31 MED ORDER — DEXAMETHASONE SODIUM PHOSPHATE 10 MG/ML IJ SOLN
INTRAMUSCULAR | Status: AC
  Filled 2020-10-31: qty 1

## 2020-10-31 MED ORDER — DEXAMETHASONE SODIUM PHOSPHATE 10 MG/ML IJ SOLN
INTRAMUSCULAR | Status: DC | PRN
  Administered 2020-10-31: 5 mg

## 2020-10-31 MED ORDER — TRANEXAMIC ACID-NACL 1000-0.7 MG/100ML-% IV SOLN
INTRAVENOUS | Status: AC
  Filled 2020-10-31: qty 100

## 2020-10-31 MED ORDER — ONDANSETRON HCL 4 MG/2ML IJ SOLN
INTRAMUSCULAR | Status: AC
  Filled 2020-10-31: qty 2

## 2020-10-31 MED ORDER — METHOCARBAMOL 500 MG IVPB - SIMPLE MED: 500.0000 mg | Freq: Four times a day (QID) | INTRAVENOUS | Status: DC | PRN

## 2020-10-31 SURGICAL SUPPLY — 58 items
BAG DECANTER FOR FLEXI CONT (MISCELLANEOUS) IMPLANT
BAG SPEC THK2 15X12 ZIP CLS (MISCELLANEOUS)
BAG ZIPLOCK 12X15 (MISCELLANEOUS) IMPLANT
BLADE SAW RECIPROCATING 77.5 (BLADE) ×2 IMPLANT
BLADE SAW SGTL 11.0X1.19X90.0M (BLADE) ×2 IMPLANT
BNDG ELASTIC 6X5.8 VLCR STR LF (GAUZE/BANDAGES/DRESSINGS) ×2 IMPLANT
BOWL SMART MIX CTS (DISPOSABLE) ×2 IMPLANT
BRNG TIB C 9 STRL KN RT MDL (Miscellaneous) ×1 IMPLANT
BSPLAT TIB C STRL KN RT MED TI (Orthopedic Implant) ×1 IMPLANT
CEMENT HV SMART SET (Cement) ×2 IMPLANT
CLSR STERI-STRIP ANTIMIC 1/2X4 (GAUZE/BANDAGES/DRESSINGS) ×1 IMPLANT
COMP FEM PRSN CEM SZ 3 RT (Orthopedic Implant) IMPLANT
COVER SURGICAL LIGHT HANDLE (MISCELLANEOUS) ×2 IMPLANT
COVER WAND RF STERILE (DRAPES) IMPLANT
CUFF TOURN SGL QUICK 34 (TOURNIQUET CUFF) ×2
CUFF TRNQT CYL 34X4.125X (TOURNIQUET CUFF) ×1 IMPLANT
DECANTER SPIKE VIAL GLASS SM (MISCELLANEOUS) IMPLANT
DRAPE U-SHAPE 47X51 STRL (DRAPES) ×2 IMPLANT
DRSG ADAPTIC 3X8 NADH LF (GAUZE/BANDAGES/DRESSINGS) ×2 IMPLANT
DRSG AQUACEL AG ADV 3.5X10 (GAUZE/BANDAGES/DRESSINGS) ×1 IMPLANT
DRSG PAD ABDOMINAL 8X10 ST (GAUZE/BANDAGES/DRESSINGS) ×2 IMPLANT
DURAPREP 26ML APPLICATOR (WOUND CARE) ×2 IMPLANT
ELECT REM PT RETURN 15FT ADLT (MISCELLANEOUS) ×2 IMPLANT
GAUZE SPONGE 4X4 12PLY STRL (GAUZE/BANDAGES/DRESSINGS) ×2 IMPLANT
GLOVE BIO SURGEON STRL SZ8 (GLOVE) ×2 IMPLANT
GLOVE SRG 8 PF TXTR STRL LF DI (GLOVE) ×2 IMPLANT
GLOVE SURG ENC MOIS LTX SZ7 (GLOVE) ×2 IMPLANT
GLOVE SURG UNDER POLY LF SZ7 (GLOVE) ×2 IMPLANT
GLOVE SURG UNDER POLY LF SZ8 (GLOVE) ×4
GOWN STRL REUS W/TWL LRG LVL3 (GOWN DISPOSABLE) ×6 IMPLANT
HANDPIECE INTERPULSE COAX TIP (DISPOSABLE) ×2
IMMOBILIZER KNEE 20 (SOFTGOODS) ×2
IMMOBILIZER KNEE 20 THIGH 36 (SOFTGOODS) ×1 IMPLANT
IMPL KNEE SIZE-C INSERT (Miscellaneous) IMPLANT
IMPLANT KNEE SIZE-C INSERT (Miscellaneous) ×2 IMPLANT
INSERTER TIP PARTIAL KNEE (MISCELLANEOUS) ×1 IMPLANT
KIT TURNOVER KIT A (KITS) IMPLANT
MANIFOLD NEPTUNE II (INSTRUMENTS) ×2 IMPLANT
NDL SAFETY ECLIPSE 18X1.5 (NEEDLE) IMPLANT
NEEDLE HYPO 18GX1.5 SHARP (NEEDLE)
NS IRRIG 1000ML POUR BTL (IV SOLUTION) ×2 IMPLANT
PACK TOTAL KNEE CUSTOM (KITS) ×2 IMPLANT
PADDING CAST COTTON 6X4 STRL (CAST SUPPLIES) ×3 IMPLANT
PART FEMUR CEM SZ 3 RT KNEE (Orthopedic Implant) ×2 IMPLANT
PARTIAL KNEE TIBIAL SZC RT (Orthopedic Implant) ×2 IMPLANT
PENCIL SMOKE EVACUATOR (MISCELLANEOUS) IMPLANT
PROTECTOR NERVE ULNAR (MISCELLANEOUS) ×2 IMPLANT
SET HNDPC FAN SPRY TIP SCT (DISPOSABLE) ×1 IMPLANT
STRIP CLOSURE SKIN 1/2X4 (GAUZE/BANDAGES/DRESSINGS) ×4 IMPLANT
SUT MNCRL AB 4-0 PS2 18 (SUTURE) ×2 IMPLANT
SUT STRATAFIX 0 PDS 27 VIOLET (SUTURE) ×2
SUT VIC AB 2-0 CT1 27 (SUTURE) ×4
SUT VIC AB 2-0 CT1 TAPERPNT 27 (SUTURE) ×2 IMPLANT
SUTURE STRATFX 0 PDS 27 VIOLET (SUTURE) ×1 IMPLANT
SYR 50ML LL SCALE MARK (SYRINGE) ×2 IMPLANT
TIBIAL PARTIAL CEMNTD SZC RT (Orthopedic Implant) IMPLANT
WATER STERILE IRR 1000ML POUR (IV SOLUTION) ×2 IMPLANT
WRAP KNEE MAXI GEL POST OP (GAUZE/BANDAGES/DRESSINGS) ×1 IMPLANT

## 2020-10-31 NOTE — Progress Notes (Signed)
AssistedDr. Chelsey Woodrum with right, ultrasound guided, adductor canal block. Side rails up, monitors on throughout procedure. See vital signs in flow sheet. Tolerated Procedure well.  

## 2020-10-31 NOTE — Anesthesia Procedure Notes (Signed)
Spinal  Patient location during procedure: OR Start time: 10/31/2020 9:57 AM End time: 10/31/2020 9:50 AM Staffing Performed: resident/CRNA  Resident/CRNA: Niel Hummer, CRNA Preanesthetic Checklist Completed: patient identified, IV checked, risks and benefits discussed, surgical consent, monitors and equipment checked and pre-op evaluation Spinal Block Patient position: sitting Prep: DuraPrep Patient monitoring: heart rate, continuous pulse ox and blood pressure Approach: midline Location: L3-4 Injection technique: single-shot Needle Needle type: Pencan  Needle gauge: 24 G Needle length: 9 cm

## 2020-10-31 NOTE — Care Plan (Signed)
Ortho Bundle Case Management Note  Patient Details  Name: Susan Randolph MRN: [id number] Date of Birth: 1946-06-21  R [facility] on 10-31-20 DCP: Home with husband.  1 story home with 1 ste. DME:  RW and 3-in-1 ordered through [facility]. PT:  EO.  PT eval scheduled on 11-02-20.                   DME Arranged:  Gilford Rile rolling,3-N-1 DME Agency:  [facility]  HH Arranged:  NA [place] Agency:  NA  Additional Comments: Please contact me with any questions of if this plan should need to change.  Marianne Sofia, RN,CCM [facility]  [phone] 10/31/2020, 11:15 AM

## 2020-10-31 NOTE — Transfer of Care (Signed)
Immediate Anesthesia Transfer of Care Note  Patient: Susan Randolph  Procedure(s) Performed: Right knee medial unicompartmental arthroplasty (Right Knee)  Patient Location: PACU  Anesthesia Type:Spinal  Level of Consciousness: awake, alert  and oriented  Airway & Oxygen Therapy: Patient Spontanous Breathing and Patient connected to face mask oxygen  Post-op Assessment: Report given to RN and Post -op Vital signs reviewed and stable  Post vital signs: Reviewed and stable  Last Vitals:  Vitals Value Taken Time  BP 131/71 10/31/20 1107  Temp    Pulse 47 10/31/20 1108  Resp 10 10/31/20 1108  SpO2 100 % 10/31/20 1108  Vitals shown include unvalidated device data.  Last Pain:  Vitals:   10/31/20 0820  TempSrc:   PainSc: 4       Patients Stated Pain Goal: 2 (20/25/42 7062)  Complications: No complications documented.

## 2020-10-31 NOTE — OR Nursing (Signed)
Physical Therapy here working with patient, patient needs to get up to go to the bathroom.

## 2020-10-31 NOTE — Discharge Instructions (Signed)
Gaynelle Arabian, MD [facility] [address] [place], [place] [address] [phone]  POSTOPERATIVE DIRECTIONS  Knee Rehabilitation, Guidelines Following Surgery  Results after knee surgery are often greatly improved when you follow the exercise, range of motion and muscle strengthening exercises prescribed by your doctor. Safety measures are also important to protect the knee from further injury. If any of these exercises cause you to have increased pain or swelling in your knee joint, decrease the amount until you are comfortable again and slowly increase them. If you have problems or questions, call your caregiver or physical therapist for advice.   BLOOD CLOT PREVENTION . Take a 10 mg Xarelto once a day for three weeks following surgery. Then take an 81 mg Aspirin once a day for three weeks. Then discontinue Aspirin. . You may resume your vitamins/supplements once you have discontinued the Xarelto. . Do not take any NSAIDs (Advil, Aleve, Ibuprofen, Meloxicam, etc.) until you have discontinued the Xarelto.    HOME CARE INSTRUCTIONS  . Remove items at home which could result in a fall. This includes throw rugs or furniture in walking pathways.  . ICE to the affected knee as much as tolerated. Icing helps control swelling. If the swelling is well controlled you will be more comfortable and rehab easier. Continue to use ice on the knee for pain and swelling from surgery. You may notice swelling that will progress down to the foot and ankle. This is normal after surgery. Elevate the leg when you are not up walking on it.    . Continue to use the breathing machine which will help keep your temperature down. It is common for your temperature to cycle up and down following surgery, especially at night when you are not up moving around and exerting yourself. The breathing machine keeps your lungs expanded and your temperature down. . Do not place  pillow under the operative knee, focus on keeping the knee straight while resting  DIET You may resume your previous home diet once you are discharged from the hospital.  DRESSING / [place] / SHOWERING . Keep your bulky bandage on for 2 days. On the third post-operative day you may remove the Ace bandage and gauze. There is a waterproof adhesive bandage on your skin which will stay in place until your first follow-up appointment. Once you remove this you will not need to place another bandage . You may begin showering 3 days following surgery, but do not submerge the incision under water.  ACTIVITY For the first 5 days, the key is rest and control of pain and swelling . Do your home exercises twice a day starting on post-operative day 3. On the days you go to physical therapy, just do the home exercises once that day. . You should rest, ice and elevate the leg for 50 minutes out of every hour. Get up and walk/stretch for 10 minutes per hour. After 5 days you can increase your activity slowly as tolerated. . Walk with your walker as instructed. Use the walker until you are comfortable transitioning to a cane. Walk with the cane in the opposite hand of the operative leg. You may discontinue the cane once you are comfortable and walking steadily. . Avoid periods of inactivity such as sitting longer than an hour when not asleep. This helps prevent blood clots.  . You may discontinue the knee immobilizer once you are able to perform a straight leg raise while lying down. Marland Kitchen  You may resume a sexual relationship in one month or when given the OK by your doctor.  . You may return to work once you are cleared by your doctor.  . Do not drive a car for 6 weeks or until released by your surgeon.  . Do not drive while taking narcotics.  TED HOSE STOCKINGS Wear the elastic stockings on both legs for three weeks following surgery during the day. You may remove them at night for sleeping.  WEIGHT  BEARING Weight bearing as tolerated with assist device (walker, cane, etc) as directed, use it as long as suggested by your surgeon or therapist, typically at least 4-6 weeks.  POSTOPERATIVE CONSTIPATION PROTOCOL Constipation - defined medically as fewer than three stools per week and severe constipation as less than one stool per week.  One of the most common issues patients have following surgery is constipation.  Even if you have a regular bowel pattern at home, your normal regimen is likely to be disrupted due to multiple reasons following surgery.  Combination of anesthesia, postoperative narcotics, change in appetite and fluid intake all can affect your bowels.  In order to avoid complications following surgery, here are some recommendations in order to help you during your recovery period.  . Colace (docusate) - Pick up an over-the-counter form of Colace or another stool softener and take twice a day as long as you are requiring postoperative pain medications.  Take with a full glass of water daily.  If you experience loose stools or diarrhea, hold the colace until you stool forms back up. If your symptoms do not get better within 1 week or if they get worse, check with your doctor. . Dulcolax (bisacodyl) - Pick up over-the-counter and take as directed by the product packaging as needed to assist with the movement of your bowels.  Take with a full glass of water.  Use this product as needed if not relieved by Colace only.  . MiraLax (polyethylene glycol) - Pick up over-the-counter to have on hand. MiraLax is a solution that will increase the amount of water in your bowels to assist with bowel movements.  Take as directed and can mix with a glass of water, juice, soda, coffee, or tea. Take if you go more than two days without a movement. Do not use MiraLax more than once per day. Call your doctor if you are still constipated or irregular after using this medication for 7 days in a row.  If you  continue to have problems with postoperative constipation, please contact the office for further assistance and recommendations.  If you experience "the worst abdominal pain ever" or develop nausea or vomiting, please contact the office immediatly for further recommendations for treatment.  ITCHING If you experience itching with your medications, try taking only a single pain pill, or even half a pain pill at a time.  You can also use Benadryl over the counter for itching or also to help with sleep.   MEDICATIONS See your medication summary on the "After Visit Summary" that the nursing staff will review with you prior to discharge.  You may have some home medications which will be placed on hold until you complete the course of blood thinner medication.  It is important for you to complete the blood thinner medication as prescribed by your surgeon.  Continue your approved medications as instructed at time of discharge.  PRECAUTIONS . If you experience chest pain or shortness of breath - call 911 immediately for  transfer to the hospital emergency department.  . If you develop a fever greater that 101 F, purulent drainage from wound, increased redness or drainage from wound, foul odor from the wound/dressing, or calf pain - CONTACT YOUR SURGEON.                                                   FOLLOW-UP APPOINTMENTS Make sure you keep all of your appointments after your operation with your surgeon and caregivers. You should call the office at the above phone number and make an appointment for approximately two weeks after the date of your surgery or on the date instructed by your surgeon outlined in the "After Visit Summary".  RANGE OF MOTION AND STRENGTHENING EXERCISES  Rehabilitation of the knee is important following a knee injury or an operation. After just a few days of immobilization, the muscles of the thigh which control the knee become weakened and shrink (atrophy). Knee exercises are  designed to build up the tone and strength of the thigh muscles and to improve knee motion. Often times heat used for twenty to thirty minutes before working out will loosen up your tissues and help with improving the range of motion but do not use heat for the first two weeks following surgery. These exercises can be done on a training (exercise) mat, on the floor, on a table or on a bed. Use what ever works the best and is most comfortable for you Knee exercises include:  . Leg Lifts - While your knee is still immobilized in a splint or cast, you can do straight leg raises. Lift the leg to 60 degrees, hold for 3 sec, and slowly lower the leg. Repeat 10-20 times 2-3 times daily. Perform this exercise against resistance later as your knee gets better.  . Quad and Hamstring Sets - Tighten up the muscle on the front of the thigh (Quad) and hold for 5-10 sec. Repeat this 10-20 times hourly. Hamstring sets are done by pushing the foot backward against an object and holding for 5-10 sec. Repeat as with quad sets.   Leg Slides: Lying on your back, slowly slide your foot toward your buttocks, bending your knee up off the floor (only go as far as is comfortable). Then slowly slide your foot back down until your leg is flat on the floor again.  [place]: Lying on your back spread your legs to the side as far apart as you can without causing discomfort.  A rehabilitation program following serious knee injuries can speed recovery and prevent re-injury in the future due to weakened muscles. Contact your doctor or a physical therapist for more information on knee rehabilitation.   IF YOU ARE TRANSFERRED TO A SKILLED REHAB FACILITY If the patient is transferred to a skilled rehab facility following release from the hospital, a list of the current medications will be sent to the facility for the patient to continue.  When discharged from the skilled rehab facility, please have the facility set up the patient's Home  Health Physical Therapy prior to being released. Also, the skilled facility will be responsible for providing the patient with their medications at time of release from the facility to include their pain medication, the muscle relaxants, and their blood thinner medication. If the patient is still at the rehab facility at time of the   two week follow up appointment, the skilled rehab facility will also need to assist the patient in arranging follow up appointment in our office and any transportation needs.  MAKE SURE YOU:  . Understand these instructions.  . Get help right away if you are not doing well or get worse.   DENTAL ANTIBIOTICS:  In most cases prophylactic antibiotics for Dental procdeures after total joint surgery are not necessary.  Exceptions are as follows:  1. History of prior total joint infection  2. Severely immunocompromised (Organ Transplant, cancer chemotherapy, Rheumatoid biologic meds such as Humera)  3. Poorly controlled diabetes (A1C &gt; 8.0, blood glucose over 200)  If you have one of these conditions, contact your surgeon for an antibiotic prescription, prior to your dental procedure.    Pick up stool softner and laxative for home use following surgery while on pain medications. Do not submerge incision under water. Please use good hand washing techniques while changing dressing each day. May shower starting three days after surgery. Please use a clean towel to pat the incision dry following showers. Continue to use ice for pain and swelling after surgery. Do not use any lotions or creams on the incision until instructed by your surgeon.  

## 2020-10-31 NOTE — Op Note (Signed)
OPERATIVE REPORT-UNICOMPARTMENTAL ARTHROPLASTY  PREOPERATIVE DIAGNOSIS: Medial compartment osteoarthritis, Right knee  POSTOPERATIVE DIAGNOSIS: Medial compartment osteoarthritis, Right knee  PROCEDURE: Right knee medial unicompartmental arthroplasty. ([facility])  SURGEON: Gaynelle Arabian, MD   ASSISTANT: Griffith Citron, PA-C  ANESTHESIA:  Adductor canal block and spinal.   ESTIMATED BLOOD LOSS: Minimal.   DRAINS: Hemovac x1.   TOURNIQUET TIME: 26 minutes @ 329 mm Hg  COMPLICATIONS: None.   CONDITION: Stable to recovery.   BRIEF CLINICAL NOTE:  Susan Randolph is a 75 y.o. female , who has  significant isolated medial compartment arthritis of the Right knee. The patient has had nonoperative management including injections of cortisone and viscous supplements. Unfortunately, the pain persists.  Radiograph showed isolated medial compartment bone-on-bone arthritis  with normal-appearing patellofemoral and lateral compartments. The patient presents now for left knee unicompartmental arthroplasty.   PROCEDURE IN DETAIL: After successful administration of  Adductor canal block and spinal anesthetic, a tourniquet was placed high on theRight thigh and the Right lower extremity prepped and draped in usual sterile fashion. Extremity was wrapped in an Esmarch, knee flexed, and tourniquet inflated to 300 mmHg.       A midline incision was made with a 10 blade through subcutaneous tissue to the extensor mechanism. A fresh blade was used to make a  medial parapatellar arthrotomy. Soft tissue on the proximal medial  tibia subperiosteally elevated to the joint line with a knife and into  the semimembranosus bursa with a Cobb elevator. The patella was  subluxed laterally, and the knee flexed 90 degrees. The ACL was intact.  The marginal osteophytes on the medial femur and tibia were removed with  a rongeur. The medial meniscus was also removed. The extramedullary tibial cutting guide was placed  referencing Proximally at the medial aspect of the tibial tubercle and distally along the 2nd metatarsal axis and tibial crest. 6 degrees of posterior slope was dialed in and the block was pinned to remove 4 mm from the medial tibial surface.The cut is made with an oscillating saw and the cut bone removed.      The 9 mm spacer was then placed with the knee in extension with a stable fit. The distal femoral cutting guide was attached to the spacer in extension and pinned to make the distal femur cut with an oscillating saw.  The trial size 3 is placed and is most appropriate. The femoral preparation is completed with the posterior and chamfer cuts and drilling of the 2 lug holes. The trial size 3 femur is placed with excellent fit. The 9 mm spacer is placed and there is excellent balance through full range of motion.  The trial and the spacer are removed and tibia addressed.      The tibial sizer is placed and size C is most appropriate. The proximal tibia is prepared with the drill holes and keel for the size C The size C implant is placed with excellent fit. The trials are removed and cut bone surfaces prepared with pulsatile lavage. The cement is mixed and once ready for implantation The size C tibia and size 3 femur are cemented into place and all extruded cement removed. The 9 mm insert is then placed into the tibial tray  and locked into position. The knee is placed through a full range of motion with excellent stability.           I then injected the extensor mechanism, periosteum of  the femur and subcu tissues, a total  of 20 mL of Exparel mixed with 30  mL of saline. Wound was copiously irrigated with saline solution, and the arthrotomy closed over a Hemovac drain with a running #0 Stratofix  suture. The subcutaneous was closed with  interrupted 2-0 Vicryl and subcuticular running 4-0 Monocryl. The drain  was hooked to suction. Incision cleaned and dried and Steri-Strips and  a bulky sterile  dressing applied. The tourniquet was released after a  total time of 26 minutes. This was done after closing the extensor  mechanism. The wound was closed and a bulky sterile dressing was  applied. The operative limb was placed into a knee immobilizer, and the patient awakened and transported to recovery room in stable condition.       Please note that a surgical assistant was a medical necessity for this  procedure in order to perform it in a safe and expeditious manner.  Assistance was necessary for retracting vital ligaments, neurovascular  structures, as well as for proper positioning of the limb to allow for  appropriate bone cuts and appropriate placement of the prosthesis.    Dione Plover Amilyah Nack, MD

## 2020-10-31 NOTE — Anesthesia Procedure Notes (Signed)
Procedure Name: MAC Date/Time: 10/31/2020 9:46 AM Performed by: Niel Hummer, CRNA Pre-anesthesia Checklist: Patient identified, Emergency Drugs available, Suction available and Patient being monitored Oxygen Delivery Method: Simple face mask

## 2020-10-31 NOTE — Evaluation (Signed)
Physical Therapy Evaluation Patient Details Name: Susan Randolph MRN: [id number] DOB: 11-13-1945 Today's Date: 10/31/2020   History of Present Illness  Patient is 75 y.o. female s/p Rt UKA on 10/31/20 with PMH significant for GERD, breast cnacer, OA, anxiety, C1-2 fusion.    Clinical Impression  Susan Randolph is a 75 y.o. female POD 0 s/p Rt UKA. Patient reports independence with mobility at baseline. Patient is now limited by functional impairments (see PT problem list below) and requires min guard for transfers and gait with RW. Patient was able to ambulate ~100 feet with RW and min guard and cues for safe walker management. Patient experienced slight buckling at Rt knee initially but was able to use UE on RW to prevent as she progressed gait. Patient educated on safe sequencing for stair mobility and verbalized safe guarding position for people assisting with mobility. Patient instructed in exercises to facilitate ROM and circulation. Patient will benefit from continued skilled PT interventions to address impairments and progress towards PLOF. Patient has met mobility goals at adequate level for discharge home; will continue to follow if pt continues acute stay to progress towards Mod I goals.    Patient was issued basic knee immobilizer (KI) for safe DC home for increased safety with stair mobility. Patient was instructed on donning/doffing of KI and instructed on short term use for ambulation/stair mobility to prevent buckling with return home. Patient educated to remove KI in sitting/supine after safely negotiating stairs into home. Educated that [place] should not be worn to bed and does not need to be used again for mobility.      Follow Up Recommendations Follow surgeon's recommendation for DC plan and follow-up therapies;Outpatient PT    Equipment Recommendations  Rolling walker with 5" wheels;3in1 (PT) (delivered in pacu)    Recommendations for Other Services       Precautions /  Restrictions Precautions Precautions: Fall Restrictions Weight Bearing Restrictions: No Other Position/Activity Restrictions: WBAT      Mobility  Bed Mobility Overal bed mobility: Needs Assistance Bed Mobility: Supine to Sit;Sit to Supine     Supine to sit: Min guard;Supervision Sit to supine: Min guard   General bed mobility comments: pt taking extra time to sit up EOB and to raise Rt LE back onto bed at EOS. no assist needed.    Transfers Overall transfer level: Needs assistance Equipment used: Rolling walker (2 wheeled) Transfers: Sit to/from Stand Sit to Stand: Min guard;Supervision         General transfer comment: VC's for safe hand placement on RW and guard/supervision to rise from EOB and toilet. pt using grab bar in bathroom.  Ambulation/Gait Ambulation/Gait assistance: Min assist;Min guard;Supervision Gait Distance (Feet): 100 Feet Assistive device: Rolling walker (2 wheeled) Gait Pattern/deviations: Step-to pattern;Decreased stride length;Decreased weight shift to right;Decreased stance time - right Gait velocity: decr   General Gait Details: pt with Rt knee buckling on initial step and min assist required to steady and prevent LOB. with cues for UE use on RW pt able to prevent Rt knee buckling in stance phase. min guard for safety initially and pt progressed to supervision level for gait. pt being very cautious with slow short steps, cues required intermittently to keep walker close.  Stairs Stairs: Yes Stairs assistance: Min guard Stair Management: No rails;Step to pattern;Backwards;With walker Number of Stairs: 1 General stair comments: cues for safe sequencing "up with good, down with bad" for reverse step up to negotiate curb/single step. pt verbalized safe guarding  position for family to provide.  Wheelchair Mobility    Modified Rankin (Stroke Patients Only)       Balance Overall balance assessment: Needs assistance Sitting-balance support:  Feet supported Sitting balance-Leahy Scale: Good     Standing balance support: During functional activity;Bilateral upper extremity supported Standing balance-Leahy Scale: Poor                               Pertinent Vitals/Pain Pain Assessment: 0-10 Pain Score: 2  Pain Location: Rt knee Pain Descriptors / Indicators: Aching;Discomfort Pain Intervention(s): Limited activity within patient's tolerance;Monitored during session;Premedicated before session;Repositioned;Ice applied    Home Living Family/patient expects to be discharged to:: Private residence Living Arrangements: Spouse/significant other Available Help at Discharge: Family Type of Home: House Home Access: Stairs to enter Entrance Stairs-Rails: None Technical brewer of Steps: 1 Home Layout: One level Home Equipment: Grab bars - tub/shower      Prior Function Level of Independence: Independent   Gait / Transfers Assistance Needed: patient has been independent with no device for mobility           Hand Dominance   Dominant Hand: Right    Extremity/Trunk Assessment   Upper Extremity Assessment Upper Extremity Assessment: Overall WFL for tasks assessed    Lower Extremity Assessment Lower Extremity Assessment: RLE deficits/detail RLE Deficits / Details: pt with good quad activation and no extensor lag with SLR. slight buckling in standing on first step however pt able to prevent with use of UE's on RW. RLE Sensation: WNL RLE Coordination: WNL    Cervical / Trunk Assessment Cervical / Trunk Assessment: Normal  Communication   Communication: No difficulties  Cognition Arousal/Alertness: Awake/alert Behavior During Therapy: WFL for tasks assessed/performed Overall Cognitive Status: Within Functional Limits for tasks assessed                                        General Comments General comments (skin integrity, edema, etc.): Rt knee immobilizer donned at EOS for pt  to use with ambulation to go home and prevent buckling of Rt knee if pt fatigues.    Exercises Total Joint Exercises Ankle Circles/Pumps: AROM;Both;10 reps;Supine Quad Sets: AROM;Right;5 reps;Supine Towel Squeeze: AROM;Both;Other reps (comment);Supine (2) Short Arc Quad: AROM;Right;Other reps (comment);Supine (3) Heel Slides: AROM;Right;Other reps (comment);Supine (3) Hip ABduction/ADduction: AROM;Right;Other reps (comment);Supine (2) Straight Leg Raises: AROM;Right;Other reps (comment);Supine (2)   Assessment/Plan    PT Assessment Patient needs continued PT services  PT Problem List Decreased strength;Decreased range of motion;Decreased activity tolerance;Decreased balance;Decreased mobility;Decreased knowledge of use of DME;Decreased knowledge of precautions       PT Treatment Interventions DME instruction;Gait training;Stair training;Functional mobility training;Therapeutic activities;Therapeutic exercise;Balance training;Patient/family education    PT Goals (Current goals can be found in the Care Plan section)  Acute Rehab PT Goals Patient Stated Goal: get back to water aerobics PT Goal Formulation: With patient Time For Goal Achievement: 11/07/20 Potential to Achieve Goals: Good    Frequency 7X/week   Barriers to discharge        Co-evaluation               AM-PAC PT "6 Clicks" Mobility  Outcome Measure Help needed turning from your back to your side while in a flat bed without using bedrails?: A Little Help needed moving from lying on your back to sitting on the  side of a flat bed without using bedrails?: A Little Help needed moving to and from a bed to a chair (including a wheelchair)?: A Little Help needed standing up from a chair using your arms (e.g., wheelchair or bedside chair)?: A Little Help needed to walk in hospital room?: A Little Help needed climbing 3-5 steps with a railing? : A Little 6 Click Score: 18    End of Session Equipment Utilized  During Treatment: Gait belt;Right knee immobilizer Activity Tolerance: Patient tolerated treatment well Patient left: in bed;with call bell/phone within reach Nurse Communication: Mobility status PT Visit Diagnosis: Muscle weakness (generalized) (M62.81);Difficulty in walking, not elsewhere classified (R26.2);Other abnormalities of gait and mobility (R26.89)    Time: 8329-1916 PT Time Calculation (min) (ACUTE ONLY): 49 min   Charges:   PT Evaluation $PT Eval Low Complexity: 1 Low PT Treatments $Gait Training: 8-22 mins $Therapeutic Exercise: 8-22 mins        Verner Mould, DPT Acute Rehabilitation Services Office [phone] Pager [phone]    Jacques Navy 10/31/2020, 7:00 PM

## 2020-10-31 NOTE — Interval H&P Note (Signed)
History and Physical Interval Note:  10/31/2020 7:58 AM  Susan Randolph  has presented today for surgery, with the diagnosis of Right knee medial compartment osteoarthritis.  The various methods of treatment have been discussed with the patient and family. After consideration of risks, benefits and other options for treatment, the patient has consented to  Procedure(s) with comments: Right knee medial unicompartmental arthroplasty (Right) - 65min as a surgical intervention.  The patient's history has been reviewed, patient examined, no change in status, stable for surgery.  I have reviewed the patient's chart and labs.  Questions were answered to the patient's satisfaction.     Pilar Plate Shad Ledvina

## 2020-10-31 NOTE — Anesthesia Procedure Notes (Signed)
Anesthesia Regional Block: Adductor canal block   Pre-Anesthetic Checklist: ,, timeout performed, Correct Patient, Correct Site, Correct Laterality, Correct Procedure, Correct Position, site marked, Risks and benefits discussed,  Surgical consent,  Pre-op evaluation,  At surgeon's request and post-op pain management  Laterality: Right  Prep: Maximum Sterile Barrier Precautions used, chloraprep       Needles:  Injection technique: Single-shot  Needle Type: Echogenic Stimulator Needle     Needle Length: 9cm  Needle Gauge: 22     Additional Needles:   Procedures:,,,, ultrasound used (permanent image in chart),,,,  Narrative:  Start time: 10/31/2020 9:05 AM End time: 10/31/2020 9:12 AM Injection made incrementally with aspirations every 5 mL.  Performed by: Personally  Anesthesiologist: Freddrick March, MD  Additional Notes: Monitors applied. No increased pain on injection. No increased resistance to injection. Injection made in 5cc increments. Good needle visualization. Patient tolerated procedure well.

## 2020-11-01 ENCOUNTER — Encounter (HOSPITAL_COMMUNITY): Payer: Self-pay | Admitting: Orthopedic Surgery

## 2020-11-01 NOTE — Anesthesia Postprocedure Evaluation (Signed)
Anesthesia Post Note  Patient: Susan Randolph  Procedure(s) Performed: Right knee medial unicompartmental arthroplasty (Right Knee)     Patient location during evaluation: PACU Anesthesia Type: Regional and Spinal Level of consciousness: oriented and awake and alert Pain management: pain level controlled Vital Signs Assessment: post-procedure vital signs reviewed and stable Respiratory status: spontaneous breathing, respiratory function stable and patient connected to nasal cannula oxygen Cardiovascular status: blood pressure returned to baseline and stable Postop Assessment: no headache, no backache and no apparent nausea or vomiting Anesthetic complications: no   No complications documented.  Last Vitals:  Vitals:   10/31/20 1547 10/31/20 1802  BP: (!) 153/76 (!) 144/82  Pulse: 66 81  Resp: 16 16  Temp: 36.8 C 37.1 C  SpO2: 100% 97%    Last Pain:  Vitals:   10/31/20 1802  TempSrc: Oral  PainSc: 0-No pain                 Harland Aguiniga L Amanda Steuart

## 2020-11-02 DIAGNOSIS — M25561 Pain in right knee: Secondary | ICD-10-CM | POA: Diagnosis not present

## 2020-11-09 DIAGNOSIS — M25561 Pain in right knee: Secondary | ICD-10-CM | POA: Diagnosis not present

## 2020-11-12 DIAGNOSIS — M25561 Pain in right knee: Secondary | ICD-10-CM | POA: Diagnosis not present

## 2020-11-14 DIAGNOSIS — M25561 Pain in right knee: Secondary | ICD-10-CM | POA: Diagnosis not present

## 2020-11-16 DIAGNOSIS — M25561 Pain in right knee: Secondary | ICD-10-CM | POA: Diagnosis not present

## 2020-11-20 DIAGNOSIS — M25561 Pain in right knee: Secondary | ICD-10-CM | POA: Diagnosis not present

## 2020-11-22 DIAGNOSIS — M25561 Pain in right knee: Secondary | ICD-10-CM | POA: Diagnosis not present

## 2020-11-27 DIAGNOSIS — M25561 Pain in right knee: Secondary | ICD-10-CM | POA: Diagnosis not present

## 2020-11-30 DIAGNOSIS — M25561 Pain in right knee: Secondary | ICD-10-CM | POA: Diagnosis not present

## 2020-12-04 DIAGNOSIS — Z4789 Encounter for other orthopedic aftercare: Secondary | ICD-10-CM | POA: Diagnosis not present

## 2020-12-05 DIAGNOSIS — M25561 Pain in right knee: Secondary | ICD-10-CM | POA: Diagnosis not present

## 2020-12-07 DIAGNOSIS — M25561 Pain in right knee: Secondary | ICD-10-CM | POA: Diagnosis not present

## 2020-12-11 DIAGNOSIS — M25561 Pain in right knee: Secondary | ICD-10-CM | POA: Diagnosis not present

## 2020-12-14 DIAGNOSIS — M25561 Pain in right knee: Secondary | ICD-10-CM | POA: Diagnosis not present

## 2020-12-18 DIAGNOSIS — M25561 Pain in right knee: Secondary | ICD-10-CM | POA: Diagnosis not present

## 2020-12-18 DIAGNOSIS — H401131 Primary open-angle glaucoma, bilateral, mild stage: Secondary | ICD-10-CM | POA: Diagnosis not present

## 2020-12-20 DIAGNOSIS — M25561 Pain in right knee: Secondary | ICD-10-CM | POA: Diagnosis not present

## 2020-12-24 DIAGNOSIS — Z0000 Encounter for general adult medical examination without abnormal findings: Secondary | ICD-10-CM | POA: Diagnosis not present

## 2020-12-24 DIAGNOSIS — I700 Atherosclerosis of aorta: Secondary | ICD-10-CM | POA: Diagnosis not present

## 2020-12-24 DIAGNOSIS — M25561 Pain in right knee: Secondary | ICD-10-CM | POA: Diagnosis not present

## 2020-12-24 DIAGNOSIS — I10 Essential (primary) hypertension: Secondary | ICD-10-CM | POA: Diagnosis not present

## 2020-12-24 DIAGNOSIS — E559 Vitamin D deficiency, unspecified: Secondary | ICD-10-CM | POA: Diagnosis not present

## 2020-12-24 DIAGNOSIS — G4700 Insomnia, unspecified: Secondary | ICD-10-CM | POA: Diagnosis not present

## 2020-12-24 DIAGNOSIS — K219 Gastro-esophageal reflux disease without esophagitis: Secondary | ICD-10-CM | POA: Diagnosis not present

## 2020-12-24 DIAGNOSIS — G43009 Migraine without aura, not intractable, without status migrainosus: Secondary | ICD-10-CM | POA: Diagnosis not present

## 2020-12-24 DIAGNOSIS — Z79899 Other long term (current) drug therapy: Secondary | ICD-10-CM | POA: Diagnosis not present

## 2020-12-25 DIAGNOSIS — M25561 Pain in right knee: Secondary | ICD-10-CM | POA: Diagnosis not present

## 2020-12-26 ENCOUNTER — Other Ambulatory Visit: Payer: Self-pay

## 2020-12-26 ENCOUNTER — Other Ambulatory Visit: Payer: Medicare PPO

## 2020-12-26 DIAGNOSIS — Z01810 Encounter for preprocedural cardiovascular examination: Secondary | ICD-10-CM | POA: Diagnosis not present

## 2020-12-26 DIAGNOSIS — E785 Hyperlipidemia, unspecified: Secondary | ICD-10-CM

## 2020-12-26 DIAGNOSIS — I10 Essential (primary) hypertension: Secondary | ICD-10-CM

## 2020-12-26 DIAGNOSIS — I700 Atherosclerosis of aorta: Secondary | ICD-10-CM

## 2020-12-26 LAB — LIPID PANEL
Chol/HDL Ratio: 2.7 ratio (ref 0.0–4.4)
Cholesterol, Total: 135 mg/dL (ref 100–199)
HDL: 50 mg/dL (ref >39)
LDL Chol Calc (NIH): 63 mg/dL (ref 0–99)
Triglycerides: 125 mg/dL (ref 0–149)
VLDL Cholesterol Cal: 22 mg/dL (ref 5–40)

## 2020-12-26 LAB — ALT: ALT: 22 IU/L (ref 0–32)

## 2020-12-27 DIAGNOSIS — M25561 Pain in right knee: Secondary | ICD-10-CM | POA: Diagnosis not present

## 2021-01-01 DIAGNOSIS — M25561 Pain in right knee: Secondary | ICD-10-CM | POA: Diagnosis not present

## 2021-01-04 DIAGNOSIS — M25561 Pain in right knee: Secondary | ICD-10-CM | POA: Diagnosis not present

## 2021-01-07 DIAGNOSIS — M25561 Pain in right knee: Secondary | ICD-10-CM | POA: Diagnosis not present

## 2021-01-29 DIAGNOSIS — S29012A Strain of muscle and tendon of back wall of thorax, initial encounter: Secondary | ICD-10-CM | POA: Diagnosis not present

## 2021-01-29 DIAGNOSIS — M9901 Segmental and somatic dysfunction of cervical region: Secondary | ICD-10-CM | POA: Diagnosis not present

## 2021-01-29 DIAGNOSIS — M47812 Spondylosis without myelopathy or radiculopathy, cervical region: Secondary | ICD-10-CM | POA: Diagnosis not present

## 2021-01-29 DIAGNOSIS — S338XXA Sprain of other parts of lumbar spine and pelvis, initial encounter: Secondary | ICD-10-CM | POA: Diagnosis not present

## 2021-01-29 DIAGNOSIS — M9903 Segmental and somatic dysfunction of lumbar region: Secondary | ICD-10-CM | POA: Diagnosis not present

## 2021-01-29 DIAGNOSIS — M9902 Segmental and somatic dysfunction of thoracic region: Secondary | ICD-10-CM | POA: Diagnosis not present

## 2021-01-31 DIAGNOSIS — M47812 Spondylosis without myelopathy or radiculopathy, cervical region: Secondary | ICD-10-CM | POA: Diagnosis not present

## 2021-01-31 DIAGNOSIS — M9902 Segmental and somatic dysfunction of thoracic region: Secondary | ICD-10-CM | POA: Diagnosis not present

## 2021-01-31 DIAGNOSIS — S338XXA Sprain of other parts of lumbar spine and pelvis, initial encounter: Secondary | ICD-10-CM | POA: Diagnosis not present

## 2021-01-31 DIAGNOSIS — S29012A Strain of muscle and tendon of back wall of thorax, initial encounter: Secondary | ICD-10-CM | POA: Diagnosis not present

## 2021-01-31 DIAGNOSIS — M9901 Segmental and somatic dysfunction of cervical region: Secondary | ICD-10-CM | POA: Diagnosis not present

## 2021-01-31 DIAGNOSIS — M9903 Segmental and somatic dysfunction of lumbar region: Secondary | ICD-10-CM | POA: Diagnosis not present

## 2021-02-06 DIAGNOSIS — M9903 Segmental and somatic dysfunction of lumbar region: Secondary | ICD-10-CM | POA: Diagnosis not present

## 2021-02-06 DIAGNOSIS — M9901 Segmental and somatic dysfunction of cervical region: Secondary | ICD-10-CM | POA: Diagnosis not present

## 2021-02-06 DIAGNOSIS — M9902 Segmental and somatic dysfunction of thoracic region: Secondary | ICD-10-CM | POA: Diagnosis not present

## 2021-02-06 DIAGNOSIS — S29012A Strain of muscle and tendon of back wall of thorax, initial encounter: Secondary | ICD-10-CM | POA: Diagnosis not present

## 2021-02-06 DIAGNOSIS — M47812 Spondylosis without myelopathy or radiculopathy, cervical region: Secondary | ICD-10-CM | POA: Diagnosis not present

## 2021-02-06 DIAGNOSIS — S338XXA Sprain of other parts of lumbar spine and pelvis, initial encounter: Secondary | ICD-10-CM | POA: Diagnosis not present

## 2021-02-08 DIAGNOSIS — M47812 Spondylosis without myelopathy or radiculopathy, cervical region: Secondary | ICD-10-CM | POA: Diagnosis not present

## 2021-02-08 DIAGNOSIS — S338XXA Sprain of other parts of lumbar spine and pelvis, initial encounter: Secondary | ICD-10-CM | POA: Diagnosis not present

## 2021-02-08 DIAGNOSIS — M9901 Segmental and somatic dysfunction of cervical region: Secondary | ICD-10-CM | POA: Diagnosis not present

## 2021-02-08 DIAGNOSIS — M9902 Segmental and somatic dysfunction of thoracic region: Secondary | ICD-10-CM | POA: Diagnosis not present

## 2021-02-08 DIAGNOSIS — S29012A Strain of muscle and tendon of back wall of thorax, initial encounter: Secondary | ICD-10-CM | POA: Diagnosis not present

## 2021-02-08 DIAGNOSIS — M9903 Segmental and somatic dysfunction of lumbar region: Secondary | ICD-10-CM | POA: Diagnosis not present

## 2021-02-17 DIAGNOSIS — J029 Acute pharyngitis, unspecified: Secondary | ICD-10-CM | POA: Diagnosis not present

## 2021-02-17 DIAGNOSIS — R509 Fever, unspecified: Secondary | ICD-10-CM | POA: Diagnosis not present

## 2021-02-17 DIAGNOSIS — R519 Headache, unspecified: Secondary | ICD-10-CM | POA: Diagnosis not present

## 2021-02-17 DIAGNOSIS — U071 COVID-19: Secondary | ICD-10-CM | POA: Diagnosis not present

## 2021-02-17 DIAGNOSIS — R059 Cough, unspecified: Secondary | ICD-10-CM | POA: Diagnosis not present

## 2021-02-28 DIAGNOSIS — S29012A Strain of muscle and tendon of back wall of thorax, initial encounter: Secondary | ICD-10-CM | POA: Diagnosis not present

## 2021-02-28 DIAGNOSIS — M9903 Segmental and somatic dysfunction of lumbar region: Secondary | ICD-10-CM | POA: Diagnosis not present

## 2021-02-28 DIAGNOSIS — S338XXA Sprain of other parts of lumbar spine and pelvis, initial encounter: Secondary | ICD-10-CM | POA: Diagnosis not present

## 2021-02-28 DIAGNOSIS — M47812 Spondylosis without myelopathy or radiculopathy, cervical region: Secondary | ICD-10-CM | POA: Diagnosis not present

## 2021-02-28 DIAGNOSIS — M9901 Segmental and somatic dysfunction of cervical region: Secondary | ICD-10-CM | POA: Diagnosis not present

## 2021-02-28 DIAGNOSIS — M9902 Segmental and somatic dysfunction of thoracic region: Secondary | ICD-10-CM | POA: Diagnosis not present

## 2021-03-05 DIAGNOSIS — M25532 Pain in left wrist: Secondary | ICD-10-CM | POA: Diagnosis not present

## 2021-03-06 DIAGNOSIS — M9902 Segmental and somatic dysfunction of thoracic region: Secondary | ICD-10-CM | POA: Diagnosis not present

## 2021-03-06 DIAGNOSIS — M9903 Segmental and somatic dysfunction of lumbar region: Secondary | ICD-10-CM | POA: Diagnosis not present

## 2021-03-06 DIAGNOSIS — S338XXA Sprain of other parts of lumbar spine and pelvis, initial encounter: Secondary | ICD-10-CM | POA: Diagnosis not present

## 2021-03-06 DIAGNOSIS — M47812 Spondylosis without myelopathy or radiculopathy, cervical region: Secondary | ICD-10-CM | POA: Diagnosis not present

## 2021-03-06 DIAGNOSIS — M9901 Segmental and somatic dysfunction of cervical region: Secondary | ICD-10-CM | POA: Diagnosis not present

## 2021-03-06 DIAGNOSIS — S29012A Strain of muscle and tendon of back wall of thorax, initial encounter: Secondary | ICD-10-CM | POA: Diagnosis not present

## 2021-03-13 DIAGNOSIS — M9903 Segmental and somatic dysfunction of lumbar region: Secondary | ICD-10-CM | POA: Diagnosis not present

## 2021-03-13 DIAGNOSIS — S29012A Strain of muscle and tendon of back wall of thorax, initial encounter: Secondary | ICD-10-CM | POA: Diagnosis not present

## 2021-03-13 DIAGNOSIS — M9902 Segmental and somatic dysfunction of thoracic region: Secondary | ICD-10-CM | POA: Diagnosis not present

## 2021-03-13 DIAGNOSIS — M47812 Spondylosis without myelopathy or radiculopathy, cervical region: Secondary | ICD-10-CM | POA: Diagnosis not present

## 2021-03-13 DIAGNOSIS — S338XXA Sprain of other parts of lumbar spine and pelvis, initial encounter: Secondary | ICD-10-CM | POA: Diagnosis not present

## 2021-03-13 DIAGNOSIS — M9901 Segmental and somatic dysfunction of cervical region: Secondary | ICD-10-CM | POA: Diagnosis not present

## 2021-03-20 DIAGNOSIS — M9903 Segmental and somatic dysfunction of lumbar region: Secondary | ICD-10-CM | POA: Diagnosis not present

## 2021-03-20 DIAGNOSIS — S338XXA Sprain of other parts of lumbar spine and pelvis, initial encounter: Secondary | ICD-10-CM | POA: Diagnosis not present

## 2021-03-20 DIAGNOSIS — S29012A Strain of muscle and tendon of back wall of thorax, initial encounter: Secondary | ICD-10-CM | POA: Diagnosis not present

## 2021-03-20 DIAGNOSIS — M9902 Segmental and somatic dysfunction of thoracic region: Secondary | ICD-10-CM | POA: Diagnosis not present

## 2021-03-20 DIAGNOSIS — M9901 Segmental and somatic dysfunction of cervical region: Secondary | ICD-10-CM | POA: Diagnosis not present

## 2021-03-20 DIAGNOSIS — M47812 Spondylosis without myelopathy or radiculopathy, cervical region: Secondary | ICD-10-CM | POA: Diagnosis not present

## 2021-03-21 DIAGNOSIS — L89899 Pressure ulcer of other site, unspecified stage: Secondary | ICD-10-CM | POA: Diagnosis not present

## 2021-03-26 DIAGNOSIS — H2513 Age-related nuclear cataract, bilateral: Secondary | ICD-10-CM | POA: Diagnosis not present

## 2021-03-26 DIAGNOSIS — H401131 Primary open-angle glaucoma, bilateral, mild stage: Secondary | ICD-10-CM | POA: Diagnosis not present

## 2021-03-27 DIAGNOSIS — S29012A Strain of muscle and tendon of back wall of thorax, initial encounter: Secondary | ICD-10-CM | POA: Diagnosis not present

## 2021-03-27 DIAGNOSIS — S338XXA Sprain of other parts of lumbar spine and pelvis, initial encounter: Secondary | ICD-10-CM | POA: Diagnosis not present

## 2021-03-27 DIAGNOSIS — M9901 Segmental and somatic dysfunction of cervical region: Secondary | ICD-10-CM | POA: Diagnosis not present

## 2021-03-27 DIAGNOSIS — M9903 Segmental and somatic dysfunction of lumbar region: Secondary | ICD-10-CM | POA: Diagnosis not present

## 2021-03-27 DIAGNOSIS — M47812 Spondylosis without myelopathy or radiculopathy, cervical region: Secondary | ICD-10-CM | POA: Diagnosis not present

## 2021-03-27 DIAGNOSIS — M9902 Segmental and somatic dysfunction of thoracic region: Secondary | ICD-10-CM | POA: Diagnosis not present

## 2021-04-15 DIAGNOSIS — M9903 Segmental and somatic dysfunction of lumbar region: Secondary | ICD-10-CM | POA: Diagnosis not present

## 2021-04-15 DIAGNOSIS — S29012A Strain of muscle and tendon of back wall of thorax, initial encounter: Secondary | ICD-10-CM | POA: Diagnosis not present

## 2021-04-15 DIAGNOSIS — S338XXA Sprain of other parts of lumbar spine and pelvis, initial encounter: Secondary | ICD-10-CM | POA: Diagnosis not present

## 2021-04-15 DIAGNOSIS — M9901 Segmental and somatic dysfunction of cervical region: Secondary | ICD-10-CM | POA: Diagnosis not present

## 2021-04-15 DIAGNOSIS — M9902 Segmental and somatic dysfunction of thoracic region: Secondary | ICD-10-CM | POA: Diagnosis not present

## 2021-04-15 DIAGNOSIS — M47812 Spondylosis without myelopathy or radiculopathy, cervical region: Secondary | ICD-10-CM | POA: Diagnosis not present

## 2021-04-25 DIAGNOSIS — M47812 Spondylosis without myelopathy or radiculopathy, cervical region: Secondary | ICD-10-CM | POA: Diagnosis not present

## 2021-04-25 DIAGNOSIS — S338XXA Sprain of other parts of lumbar spine and pelvis, initial encounter: Secondary | ICD-10-CM | POA: Diagnosis not present

## 2021-04-25 DIAGNOSIS — M9901 Segmental and somatic dysfunction of cervical region: Secondary | ICD-10-CM | POA: Diagnosis not present

## 2021-04-25 DIAGNOSIS — M9903 Segmental and somatic dysfunction of lumbar region: Secondary | ICD-10-CM | POA: Diagnosis not present

## 2021-04-25 DIAGNOSIS — M9902 Segmental and somatic dysfunction of thoracic region: Secondary | ICD-10-CM | POA: Diagnosis not present

## 2021-04-25 DIAGNOSIS — S29012A Strain of muscle and tendon of back wall of thorax, initial encounter: Secondary | ICD-10-CM | POA: Diagnosis not present

## 2021-05-09 DIAGNOSIS — S338XXA Sprain of other parts of lumbar spine and pelvis, initial encounter: Secondary | ICD-10-CM | POA: Diagnosis not present

## 2021-05-09 DIAGNOSIS — M9902 Segmental and somatic dysfunction of thoracic region: Secondary | ICD-10-CM | POA: Diagnosis not present

## 2021-05-09 DIAGNOSIS — M9901 Segmental and somatic dysfunction of cervical region: Secondary | ICD-10-CM | POA: Diagnosis not present

## 2021-05-09 DIAGNOSIS — M9903 Segmental and somatic dysfunction of lumbar region: Secondary | ICD-10-CM | POA: Diagnosis not present

## 2021-05-09 DIAGNOSIS — M47812 Spondylosis without myelopathy or radiculopathy, cervical region: Secondary | ICD-10-CM | POA: Diagnosis not present

## 2021-05-09 DIAGNOSIS — S29012A Strain of muscle and tendon of back wall of thorax, initial encounter: Secondary | ICD-10-CM | POA: Diagnosis not present

## 2021-05-17 DIAGNOSIS — M6281 Muscle weakness (generalized): Secondary | ICD-10-CM | POA: Diagnosis not present

## 2021-05-17 DIAGNOSIS — R2689 Other abnormalities of gait and mobility: Secondary | ICD-10-CM | POA: Diagnosis not present

## 2021-05-17 DIAGNOSIS — M545 Low back pain, unspecified: Secondary | ICD-10-CM | POA: Diagnosis not present

## 2021-05-22 DIAGNOSIS — M6281 Muscle weakness (generalized): Secondary | ICD-10-CM | POA: Diagnosis not present

## 2021-05-22 DIAGNOSIS — R2689 Other abnormalities of gait and mobility: Secondary | ICD-10-CM | POA: Diagnosis not present

## 2021-05-22 DIAGNOSIS — M545 Low back pain, unspecified: Secondary | ICD-10-CM | POA: Diagnosis not present

## 2021-05-24 DIAGNOSIS — M6281 Muscle weakness (generalized): Secondary | ICD-10-CM | POA: Diagnosis not present

## 2021-05-24 DIAGNOSIS — R2689 Other abnormalities of gait and mobility: Secondary | ICD-10-CM | POA: Diagnosis not present

## 2021-05-24 DIAGNOSIS — M545 Low back pain, unspecified: Secondary | ICD-10-CM | POA: Diagnosis not present

## 2021-05-28 DIAGNOSIS — M6281 Muscle weakness (generalized): Secondary | ICD-10-CM | POA: Diagnosis not present

## 2021-05-28 DIAGNOSIS — R2689 Other abnormalities of gait and mobility: Secondary | ICD-10-CM | POA: Diagnosis not present

## 2021-05-28 DIAGNOSIS — M545 Low back pain, unspecified: Secondary | ICD-10-CM | POA: Diagnosis not present

## 2021-05-30 DIAGNOSIS — R2689 Other abnormalities of gait and mobility: Secondary | ICD-10-CM | POA: Diagnosis not present

## 2021-05-30 DIAGNOSIS — M545 Low back pain, unspecified: Secondary | ICD-10-CM | POA: Diagnosis not present

## 2021-05-30 DIAGNOSIS — M6281 Muscle weakness (generalized): Secondary | ICD-10-CM | POA: Diagnosis not present

## 2021-06-04 DIAGNOSIS — M6281 Muscle weakness (generalized): Secondary | ICD-10-CM | POA: Diagnosis not present

## 2021-06-04 DIAGNOSIS — R2689 Other abnormalities of gait and mobility: Secondary | ICD-10-CM | POA: Diagnosis not present

## 2021-06-04 DIAGNOSIS — M545 Low back pain, unspecified: Secondary | ICD-10-CM | POA: Diagnosis not present

## 2021-06-11 DIAGNOSIS — M6281 Muscle weakness (generalized): Secondary | ICD-10-CM | POA: Diagnosis not present

## 2021-06-11 DIAGNOSIS — R2689 Other abnormalities of gait and mobility: Secondary | ICD-10-CM | POA: Diagnosis not present

## 2021-06-11 DIAGNOSIS — M545 Low back pain, unspecified: Secondary | ICD-10-CM | POA: Diagnosis not present

## 2021-06-13 DIAGNOSIS — M6281 Muscle weakness (generalized): Secondary | ICD-10-CM | POA: Diagnosis not present

## 2021-06-13 DIAGNOSIS — R2689 Other abnormalities of gait and mobility: Secondary | ICD-10-CM | POA: Diagnosis not present

## 2021-06-13 DIAGNOSIS — M545 Low back pain, unspecified: Secondary | ICD-10-CM | POA: Diagnosis not present

## 2021-06-18 DIAGNOSIS — M6281 Muscle weakness (generalized): Secondary | ICD-10-CM | POA: Diagnosis not present

## 2021-06-18 DIAGNOSIS — M545 Low back pain, unspecified: Secondary | ICD-10-CM | POA: Diagnosis not present

## 2021-06-18 DIAGNOSIS — R2689 Other abnormalities of gait and mobility: Secondary | ICD-10-CM | POA: Diagnosis not present

## 2021-06-20 DIAGNOSIS — M6281 Muscle weakness (generalized): Secondary | ICD-10-CM | POA: Diagnosis not present

## 2021-06-20 DIAGNOSIS — R2689 Other abnormalities of gait and mobility: Secondary | ICD-10-CM | POA: Diagnosis not present

## 2021-06-20 DIAGNOSIS — M545 Low back pain, unspecified: Secondary | ICD-10-CM | POA: Diagnosis not present

## 2021-06-25 DIAGNOSIS — M545 Low back pain, unspecified: Secondary | ICD-10-CM | POA: Diagnosis not present

## 2021-06-25 DIAGNOSIS — M6281 Muscle weakness (generalized): Secondary | ICD-10-CM | POA: Diagnosis not present

## 2021-06-25 DIAGNOSIS — R2689 Other abnormalities of gait and mobility: Secondary | ICD-10-CM | POA: Diagnosis not present

## 2021-06-28 DIAGNOSIS — R2689 Other abnormalities of gait and mobility: Secondary | ICD-10-CM | POA: Diagnosis not present

## 2021-06-28 DIAGNOSIS — M545 Low back pain, unspecified: Secondary | ICD-10-CM | POA: Diagnosis not present

## 2021-06-28 DIAGNOSIS — M6281 Muscle weakness (generalized): Secondary | ICD-10-CM | POA: Diagnosis not present

## 2021-07-02 DIAGNOSIS — M545 Low back pain, unspecified: Secondary | ICD-10-CM | POA: Diagnosis not present

## 2021-07-02 DIAGNOSIS — M6281 Muscle weakness (generalized): Secondary | ICD-10-CM | POA: Diagnosis not present

## 2021-07-02 DIAGNOSIS — R2689 Other abnormalities of gait and mobility: Secondary | ICD-10-CM | POA: Diagnosis not present

## 2021-07-09 DIAGNOSIS — M545 Low back pain, unspecified: Secondary | ICD-10-CM | POA: Diagnosis not present

## 2021-07-09 DIAGNOSIS — R2689 Other abnormalities of gait and mobility: Secondary | ICD-10-CM | POA: Diagnosis not present

## 2021-07-09 DIAGNOSIS — M6281 Muscle weakness (generalized): Secondary | ICD-10-CM | POA: Diagnosis not present

## 2021-07-17 DIAGNOSIS — M545 Low back pain, unspecified: Secondary | ICD-10-CM | POA: Diagnosis not present

## 2021-07-17 DIAGNOSIS — M6281 Muscle weakness (generalized): Secondary | ICD-10-CM | POA: Diagnosis not present

## 2021-07-17 DIAGNOSIS — R2689 Other abnormalities of gait and mobility: Secondary | ICD-10-CM | POA: Diagnosis not present

## 2021-07-24 DIAGNOSIS — M545 Low back pain, unspecified: Secondary | ICD-10-CM | POA: Diagnosis not present

## 2021-07-24 DIAGNOSIS — R2689 Other abnormalities of gait and mobility: Secondary | ICD-10-CM | POA: Diagnosis not present

## 2021-07-24 DIAGNOSIS — M6281 Muscle weakness (generalized): Secondary | ICD-10-CM | POA: Diagnosis not present

## 2021-07-29 DIAGNOSIS — L538 Other specified erythematous conditions: Secondary | ICD-10-CM | POA: Diagnosis not present

## 2021-07-29 DIAGNOSIS — L821 Other seborrheic keratosis: Secondary | ICD-10-CM | POA: Diagnosis not present

## 2021-07-29 DIAGNOSIS — L814 Other melanin hyperpigmentation: Secondary | ICD-10-CM | POA: Diagnosis not present

## 2021-07-29 DIAGNOSIS — L720 Epidermal cyst: Secondary | ICD-10-CM | POA: Diagnosis not present

## 2021-07-29 DIAGNOSIS — L570 Actinic keratosis: Secondary | ICD-10-CM | POA: Diagnosis not present

## 2021-07-29 DIAGNOSIS — L820 Inflamed seborrheic keratosis: Secondary | ICD-10-CM | POA: Diagnosis not present

## 2021-07-29 DIAGNOSIS — D1801 Hemangioma of skin and subcutaneous tissue: Secondary | ICD-10-CM | POA: Diagnosis not present

## 2021-07-29 DIAGNOSIS — Z789 Other specified health status: Secondary | ICD-10-CM | POA: Diagnosis not present

## 2021-08-01 DIAGNOSIS — M47816 Spondylosis without myelopathy or radiculopathy, lumbar region: Secondary | ICD-10-CM | POA: Diagnosis not present

## 2021-08-02 ENCOUNTER — Other Ambulatory Visit: Payer: Self-pay | Admitting: Oncology

## 2021-08-02 DIAGNOSIS — Z9889 Other specified postprocedural states: Secondary | ICD-10-CM

## 2021-08-27 NOTE — Progress Notes (Signed)
[facility]  Telephone:(336) [phone] Fax:(336) [phone]     ID: Susan Randolph DOB: 15-Dec-1945  MR#: [id number]  FBP#:[id number]  Patient Care Team: Josetta Huddle, MD as PCP - General (Internal Medicine) Jerline Pain, MD as PCP - Cardiology (Cardiology) Mauro Kaufmann, RN as Oncology Nurse Navigator Rockwell Germany, RN as Oncology Nurse Navigator Alphonsa Overall, MD as Consulting Physician (General Surgery) Waldron Gerry, Virgie Dad, MD as Consulting Physician (Oncology) Eppie Gibson, MD as Attending Physician (Radiation Oncology) Gaynelle Arabian, MD as Consulting Physician (Orthopedic Surgery) Earnie Larsson, MD as Consulting Physician (Neurosurgery) Christophe Louis, MD as Consulting Physician (Obstetrics and Gynecology) Clarene Essex, MD as Consulting Physician (Gastroenterology) Chauncey Cruel, MD OTHER MD:  CHIEF COMPLAINT: estrogen receptor positive breast cancer  CURRENT TREATMENT: anastrozole   INTERVAL HISTORY: Susan Randolph was scheduled today for follow up of her estrogen receptor positive breast cancer. However she did not show    Her most recent bone density screening on 08/25/2019 showed a T-score of -1.8, which is considered osteopenic.  She is scheduled for annual mammography on 09/03/2021.   REVIEW OF SYSTEMS: Susan Randolph    COVID 19 VACCINATION STATUS: [place] x2; infection 02/2021   HISTORY OF CURRENT ILLNESS: From the original intake note:  Susan Randolph had routine screening mammography on 08/02/2019 showing a possible abnormality in the left breast. She underwent left diagnostic mammography with tomography and left breast ultrasonography at [facility] on 08/08/2019 showing: breast density category B; 6 mm mass in the left breast at 12 o'clock; no enlarged or abnormal left axillary lymph nodes.  Accordingly on 08/09/2019 she proceeded to biopsy of the left breast area in question. The pathology from this procedure ([id number]) showed: invasive mammary  carcinoma, grade 2, e-cadherin positive. Prognostic indicators significant for: estrogen receptor, 100% positive and progesterone receptor, 95% positive, both with strong staining intensity. Proliferation marker Ki67 at 2%. HER2 equivocal by immunohistochemistry (2+), but negative by fluorescent in situ hybridization with a signals ratio 1.19 and number per cell 1.85.  The patient's subsequent history is as detailed below.   PAST MEDICAL HISTORY: Past Medical History:  Diagnosis Date   Anxiety    Arthritis    Breast cancer ([facility]) 06/2019   left IMC   Cancer ([place])    basal cell carcinoma   GERD (gastroesophageal reflux disease)    History of radiation therapy 10/24/19- 11/18/19   Left Breast 15 fractions of 2.67 Gy to total 40.05 Gy. Left breast boost 5 fractions of 2 Gy to total 10 Gy   Personal history of radiation therapy 10/2019   Rectal bleed 09/2016    PAST SURGICAL HISTORY: Past Surgical History:  Procedure Laterality Date   BREAST BIOPSY Left 08/2019   BREAST EXCISIONAL BIOPSY Left 08/2019   BREAST LUMPECTOMY WITH RADIOACTIVE SEED AND SENTINEL LYMPH NODE BIOPSY Left 08/31/2019   Procedure: LEFT BREAST LUMPECTOMY WITH RADIOACTIVE SEED AND LEFT AXILLARY SENTINEL LYMPH NODE BIOPSY;  Surgeon: Alphonsa Overall, MD;  Location: [facility];  Service: General;  Laterality: Left;   EYE SURGERY     Corrected drooping eye lids bilateral   MENISCUS REPAIR Right 03/29/2019   PARTIAL KNEE ARTHROPLASTY Right 10/31/2020   Procedure: Right knee medial unicompartmental arthroplasty;  Surgeon: Gaynelle Arabian, MD;  Location: [facility];  Service: Orthopedics;  Laterality: Right;  70mn   POSTERIOR CERVICAL FUSION/FORAMINOTOMY N/A 01/11/2018   Procedure: Posterior Cervical Fusion with lateral mass fixation - Cervical one-Cervical two with Iliac Crest bone graft;  Surgeon: Earnie Larsson, MD;  Location: [facility];  Service: Neurosurgery;  Laterality: N/A;   TUBAL LIGATION      FAMILY  HISTORY: Family History  Problem Relation Age of Onset   Cancer Mother        breast cancer/endometrial cancer   Hypertension Mother    Breast cancer Mother    Hypertension Father    Cancer Father        Glioblastoma   Arthritis Father    Rheum arthritis Maternal Uncle    Colon cancer Maternal Uncle   Patient's father was 66 years old when he died from [place]. Patient's mother is 59 years old as of 07/2019.  She was diagnosed with breast cancer at age 62 and with endometrial cancer at age 82. The patient denies a family hx of ovarian cancer. She has no siblings. She also notes a maternal uncle with colon cancer in his 5s.   GYNECOLOGIC HISTORY:  No LMP recorded. Patient is postmenopausal. Menarche: 75 years old Age at first live birth: 75 years old [place] P 3 LMP age 57 Contraceptive yes, unsure how long, no issues HRT yes, used for 5 years  Hysterectomy? no BSO? no   SOCIAL HISTORY: (updated 07/2019)  Susan Randolph is currently retired from working as a [employer].  Husband Susan Randolph") is a retired Clinical biochemist. She lives at home with [name]. Daughter Susan Randolph, age 15, is a homemaker in [place]. Daughter Susan Randolph, age 38, is a Marine scientist at the [employer] hospital in [place]. The patient lost her son [name] to a car accident in 2015.  The patient has 3 grandchildren.  She attends [employer].    ADVANCED DIRECTIVES: In the absence of any documentation to the contrary, the patient's spouse is her 67.   HEALTH MAINTENANCE: Social History   Tobacco Use   Smoking status: Never   Smokeless tobacco: Never  Vaping Use   Vaping Use: Never used  Substance Use Topics   Alcohol use: Yes    Comment: social   Drug use: No     Colonoscopy: 2014  PAP: 03/2017, negative  Bone density: scheduled 08/2019   Allergies  Allergen Reactions   Sulfa Antibiotics Other (See Comments)    Doesn't remember, happened in childhood   Amitriptyline Other (See  Comments)    tremors    Current Outpatient Medications  Medication Sig Dispense Refill   acetaminophen (TYLENOL) 500 MG tablet Take 1,000 mg by mouth 2 (two) times daily as needed for mild pain.     ALPRAZolam (XANAX) 0.25 MG tablet Take 0.25 mg by mouth daily as needed for anxiety.     anastrozole (ARIMIDEX) 1 MG tablet Take 1 tablet (1 mg total) by mouth daily. 90 tablet 4   bimatoprost (LUMIGAN) 0.01 % SOLN Place 1 drop into both eyes at bedtime.      bisacodyl (BISACODYL) 5 MG EC tablet Take 5 mg by mouth daily as needed for moderate constipation.     brimonidine-timolol (COMBIGAN) 0.2-0.5 % ophthalmic solution Place 1 drop into both eyes 2 (two) times daily.     Calcium Polycarbophil (FIBER-CAPS PO) Take 2 capsules by mouth daily.     fluticasone (FLONASE) 50 MCG/ACT nasal spray Place 2 sprays into both nostrils daily as needed for allergies or rhinitis.     gabapentin (NEURONTIN) 300 MG capsule Take 1 capsule (300 mg total) by mouth at bedtime. 90 capsule 4   loperamide (IMODIUM A-D) 2 MG tablet Take  2-4 mg by mouth 4 (four) times daily as needed for diarrhea or loose stools.     loratadine (CLARITIN) 10 MG tablet Take 10 mg by mouth daily.     methocarbamol (ROBAXIN) 500 MG tablet Take 1 tablet (500 mg total) by mouth every 6 (six) hours as needed for muscle spasms. 40 tablet 0   omeprazole (PRILOSEC) 20 MG capsule Take 20 mg by mouth daily.     oxyCODONE (ROXICODONE) 5 MG immediate release tablet Take 1-2 tablets (5-10 mg total) by mouth every 6 (six) hours as needed for severe pain. 42 tablet 0   rivaroxaban (XARELTO) 10 MG TABS tablet Take 1 tablet (10 mg total) by mouth daily for 21 days. Then take one 81 mg aspirin once a day for three weeks. Then discontinue aspirin. 21 tablet 0   rosuvastatin (CRESTOR) 10 MG tablet Take 1 tablet (10 mg total) by mouth daily. 30 tablet 11   traMADol (ULTRAM) 50 MG tablet Take 1-2 tablets (50-100 mg total) by mouth every 6 (six) hours as needed for  moderate pain. 40 tablet 0   traZODone (DESYREL) 50 MG tablet Take 50 mg by mouth at bedtime.     No current facility-administered medications for this visit.    OBJECTIVE: white woman who appears younger than stated age  There were no vitals filed for this visit.    There is no height or weight on file to calculate BMI.   Wt Readings from Last 3 Encounters:  10/31/20 141 lb (64 kg)  10/23/20 141 lb (64 kg)  09/26/20 139 lb (63 kg)      ECOG FS:1 - Symptomatic but completely ambulatory    LAB RESULTS:  CMP     Component Value Date/Time   NA 139 10/23/2020 1137   K 4.9 10/23/2020 1137   CL 103 10/23/2020 1137   CO2 29 10/23/2020 1137   GLUCOSE 99 10/23/2020 1137   BUN 17 10/23/2020 1137   CREATININE 0.67 10/23/2020 1137   CREATININE 0.83 08/17/2019 0846   CALCIUM 10.3 10/23/2020 1137   PROT 6.7 10/23/2020 1137   ALBUMIN 4.3 10/23/2020 1137   AST 20 10/23/2020 1137   AST 16 08/17/2019 0846   ALT 22 12/26/2020 0842   ALT 17 08/17/2019 0846   ALKPHOS 56 10/23/2020 1137   BILITOT 0.6 10/23/2020 1137   BILITOT 0.4 08/17/2019 0846   GFRNONAA >60 10/23/2020 1137   GFRNONAA >60 08/17/2019 0846   GFRAA >60 04/09/2020 1255   GFRAA >60 08/17/2019 0846    No results found for: TOTALPROTELP, ALBUMINELP, A1GS, A2GS, BETS, BETA2SER, GAMS, MSPIKE, SPEI  No results found for: KPAFRELGTCHN, LAMBDASER, KAPLAMBRATIO  Lab Results  Component Value Date   WBC 5.2 10/23/2020   NEUTROABS 2.5 08/28/2020   HGB 12.6 10/23/2020   HCT 37.6 10/23/2020   MCV 92.4 10/23/2020   PLT 169 10/23/2020   No results found for: LABCA2  No components found for: EXNTZG017  No results for input(s): INR in the last 168 hours.  No results found for: LABCA2  No results found for: CBS496  No results found for: PRF163  No results found for: WGY659  No results found for: CA2729  No components found for: HGQUANT  No results found for: CEA1 / No results found for: CEA1   No results found  for: AFPTUMOR  No results found for: [place]  No results found for: HGBA, HGBA2QUANT, HGBFQUANT, HGBSQUAN (Hemoglobinopathy evaluation)   No results found for: LDH  No results  found for: IRON, TIBC, IRONPCTSAT (Iron and TIBC)  No results found for: FERRITIN  Urinalysis No results found for: COLORURINE, APPEARANCEUR, LABSPEC, PHURINE, GLUCOSEU, HGBUR, BILIRUBINUR, KETONESUR, PROTEINUR, UROBILINOGEN, NITRITE, LEUKOCYTESUR   STUDIES: No results found.   ELIGIBLE FOR AVAILABLE RESEARCH PROTOCOL: no  ASSESSMENT: 75 y.o. [place] woman status post left breast upper outer quadrant biopsy 08/09/2019 for a clinical T1b N0, stage IA invasive ductal carcinoma, grade 2, estrogen and progesterone receptor positive, HER-2 not amplified, with an MIB-1 of 2%.  (1) status post left lumpectomy and sentinel lymph node sampling 08/31/2019 for a pT1a pN0, stage IA invasive ductal carcinoma, with negative margins  (2) adjuvant radiation 10/24/2019-11/18/2019 Site Technique Total Dose (Gy) Dose per Fx (Gy) Completed Fx Beam Energies  Breast, Left: Breast_Lt 3D 40.05/40.05 2.67 15/15 6X  Breast, Left: Breast_Lt_Bst 3D 10/10 2 5/5 6X   (3) started anastrozole 01/08/2020  (a) bone density 08/25/2019 shows a T score of -1.8 ([facility])   PLAN: Jocelyn Lamer did not show for her 08/28/2021 appointment. A follow-up letter has been sent.   Chauncey Cruel, MD   08/29/2021 8:08 AM Medical Oncology and Hematology [facility] [place], [place] [address] Tel. [phone]    Fax. [phone]   This document serves as a record of services personally performed by Lurline Del, MD. It was created on his behalf by Wilburn Mylar, a trained medical scribe. The creation of this record is based on the scribe's personal observations and the provider's statements to them.   I, Lurline Del MD, have reviewed the above documentation for accuracy and completeness, and I  agree with the above.   *Total Encounter Time as defined by the Centers for Medicare and Medicaid Services includes, in addition to the face-to-face time of a patient visit (documented in the note above) non-face-to-face time: obtaining and reviewing outside history, ordering and reviewing medications, tests or procedures, care coordination (communications with other health care professionals or caregivers) and documentation in the medical record.

## 2021-08-28 ENCOUNTER — Inpatient Hospital Stay: Payer: Medicare PPO | Attending: Oncology | Admitting: Oncology

## 2021-08-28 ENCOUNTER — Inpatient Hospital Stay: Payer: Medicare PPO

## 2021-08-28 DIAGNOSIS — C50412 Malignant neoplasm of upper-outer quadrant of left female breast: Secondary | ICD-10-CM

## 2021-08-28 DIAGNOSIS — Z170 Estrogen receptor positive status [ER+]: Secondary | ICD-10-CM

## 2021-08-29 ENCOUNTER — Encounter: Payer: Self-pay | Admitting: Oncology

## 2021-09-02 DIAGNOSIS — M5416 Radiculopathy, lumbar region: Secondary | ICD-10-CM | POA: Diagnosis not present

## 2021-09-03 ENCOUNTER — Other Ambulatory Visit: Payer: Self-pay

## 2021-09-03 ENCOUNTER — Ambulatory Visit
Admission: RE | Admit: 2021-09-03 | Discharge: 2021-09-03 | Disposition: A | Discharge status: Home / Self Care | Payer: Medicare PPO | Source: Ambulatory Visit | Attending: Oncology | Admitting: Oncology

## 2021-09-03 DIAGNOSIS — Z9889 Other specified postprocedural states: Secondary | ICD-10-CM

## 2021-09-03 DIAGNOSIS — R922 Inconclusive mammogram: Secondary | ICD-10-CM | POA: Diagnosis not present

## 2021-09-03 DIAGNOSIS — Z853 Personal history of malignant neoplasm of breast: Secondary | ICD-10-CM | POA: Diagnosis not present

## 2021-09-09 DIAGNOSIS — M47816 Spondylosis without myelopathy or radiculopathy, lumbar region: Secondary | ICD-10-CM | POA: Diagnosis not present

## 2021-09-24 NOTE — Progress Notes (Signed)
[facility]  [phone] [phone]     ID: Susan Randolph DOB: [birth year]  MR#: [id number]  HRC#:[id number]  Patient Care Team: Josetta Huddle, MD as PCP - General (Internal Medicine) Jerline Pain, MD as PCP - Cardiology (Cardiology) Mauro Kaufmann, RN as Oncology Nurse Navigator Rockwell Germany, RN as Oncology Nurse Navigator Alphonsa Overall, MD as Consulting Physician (General Surgery) Viney Acocella, Virgie Dad, MD as Consulting Physician (Oncology) Eppie Gibson, MD as Attending Physician (Radiation Oncology) Gaynelle Arabian, MD as Consulting Physician (Orthopedic Surgery) Earnie Larsson, MD as Consulting Physician (Neurosurgery) Christophe Louis, MD as Consulting Physician (Obstetrics and Gynecology) Clarene Essex, MD as Consulting Physician (Gastroenterology) Chauncey Cruel, MD OTHER MD:  CHIEF COMPLAINT: estrogen receptor positive breast cancer  CURRENT TREATMENT: anastrozole   INTERVAL HISTORY: Susan Randolph returns today for follow up of her estrogen receptor positive breast cancer.   She continues on anastrozole and is tolerating it well.  She was having some nighttime hot flashes and took gabapentin for that which did work but she felt it might have been interfering with her mental acuity so she stopped.  Now she takes trazodone at night and that helps her fall asleep.   Her most recent bone density screening on 08/25/2019 showed a T-score of -1.8, which is considered osteopenic.  Since her last visit, she underwent bilateral diagnostic mammography with tomography at [facility] on 09/03/2021 showing: breast density category B; no evidence of malignancy in either breast.   REVIEW OF SYSTEMS: Susan Randolph is using some CBD oil to also help her sleep.  She is concerned that this might interfere with some of her other medications particularly the statin.  For exercise she does water aerobics 3 times a week.  [facility].  She had  knee surgery in January and that was difficult but she feels she has made a good recovery.  A detailed review of systems today was otherwise stable.   COVID 19 VACCINATION STATUS: [place] x2; infection 02/2021   HISTORY OF CURRENT ILLNESS: From the original intake note:  Susan Randolph had routine screening mammography on 08/02/2019 showing a possible abnormality in the left breast. She underwent left diagnostic mammography with tomography and left breast ultrasonography at [facility] on 08/08/2019 showing: breast density category B; 6 mm mass in the left breast at 12 o'clock; no enlarged or abnormal left axillary lymph nodes.  Accordingly on 08/09/2019 she proceeded to biopsy of the left breast area in question. The pathology from this procedure ([id number]) showed: invasive mammary carcinoma, grade 2, e-cadherin positive. Prognostic indicators significant for: estrogen receptor, 100% positive and progesterone receptor, 95% positive, both with strong staining intensity. Proliferation marker Ki67 at 2%. HER2 equivocal by immunohistochemistry (2+), but negative by fluorescent in situ hybridization with a signals ratio 1.19 and number per cell 1.85.  The patient's subsequent history is as detailed below.   PAST MEDICAL HISTORY: Past Medical History:  Diagnosis Date   Anxiety    Arthritis    Breast cancer ([place]) 06/2019   left IMC   Cancer ([place])    basal cell carcinoma   GERD (gastroesophageal reflux disease)    History of radiation therapy 10/24/19- 11/18/19   Left Breast 15 fractions of 2.67 Gy to total 40.05 Gy. Left breast boost 5 fractions of 2 Gy to total 10 Gy   Personal history of radiation therapy 10/2019   Rectal bleed 09/2016    PAST SURGICAL HISTORY:  Past Surgical History:  Procedure Laterality Date   BREAST BIOPSY Left 08/2019   BREAST LUMPECTOMY Left 2020   BREAST LUMPECTOMY WITH RADIOACTIVE SEED AND SENTINEL LYMPH NODE BIOPSY Left 08/31/2019   Procedure: LEFT  BREAST LUMPECTOMY WITH RADIOACTIVE SEED AND LEFT AXILLARY SENTINEL LYMPH NODE BIOPSY;  Surgeon: Alphonsa Overall, MD;  Location: [facility];  Service: General;  Laterality: Left;   EYE SURGERY     Corrected drooping eye lids bilateral   MENISCUS REPAIR Right 03/29/2019   PARTIAL KNEE ARTHROPLASTY Right 10/31/2020   Procedure: Right knee medial unicompartmental arthroplasty;  Surgeon: Gaynelle Arabian, MD;  Location: [facility];  Service: Orthopedics;  Laterality: Right;  26mn   POSTERIOR CERVICAL FUSION/FORAMINOTOMY N/A 01/11/2018   Procedure: Posterior Cervical Fusion with lateral mass fixation - Cervical one-Cervical two with Iliac Crest bone graft;  Surgeon: PEarnie Larsson MD;  [facility]  Service: Neurosurgery;  Laterality: N/A;   TUBAL LIGATION      FAMILY HISTORY: Family History  Problem Relation Age of Onset   Cancer Mother        breast cancer/endometrial cancer   Hypertension Mother    Breast cancer Mother    Hypertension Father    Cancer Father        Glioblastoma   Arthritis Father    Rheum arthritis Maternal Uncle    Colon cancer Maternal Uncle   Patient's father was 747years old when he died from [place] Patient's mother is 913years old as of 07/2019.  She was diagnosed with breast cancer at age 8283and with endometrial cancer at age 74 The patient denies a family hx of ovarian cancer. She has no siblings. She also notes a maternal uncle with colon cancer in his 733s   GYNECOLOGIC HISTORY:  No LMP recorded. Patient is postmenopausal. Menarche: 75years old Age at first live birth: 75years old [place] L[place] age [age]Contraceptive yes, unsure how long, no issues HRT yes, used for 5 years  Hysterectomy? no BSO? 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  The patient has 3 grandchildren.  She attends [employer].    ADVANCED DIRECTIVES: In the absence of any documentation to the contrary, the patient's spouse is her H76   HEALTH MAINTENANCE: Social History   Tobacco Use   Smoking status: Never   Smokeless tobacco: Never  Vaping Use   Vaping Use: Never used  Substance Use Topics   Alcohol use: Yes    Comment: social   Drug use: No     Colonoscopy: 2014  PAP: 03/2017, negative  Bone density: scheduled 08/2019   Allergies  Allergen Reactions   Sulfa Antibiotics Other (See Comments)    Doesn't remember, happened in childhood   Amitriptyline Other (See Comments)    tremors    Current Outpatient Medications  Medication Sig Dispense Refill   acetaminophen (TYLENOL) 500 MG tablet Take 1,000 mg by mouth 2 (two) times daily as needed for mild pain.     ALPRAZolam (XANAX) 0.25 MG tablet Take 0.25 mg by mouth daily as needed for anxiety.  anastrozole (ARIMIDEX) 1 MG tablet Take 1 tablet (1 mg total) by mouth daily. 90 tablet 4   bimatoprost (LUMIGAN) 0.01 % SOLN Place 1 drop into both eyes at bedtime.      bisacodyl (BISACODYL) 5 MG EC tablet Take 5 mg by mouth daily as needed for moderate constipation.     brimonidine-timolol (COMBIGAN) 0.2-0.5 % ophthalmic solution Place 1 drop into both eyes 2 (two) times daily.     Calcium Polycarbophil (FIBER-CAPS PO) Take 2 capsules by mouth daily.     fluticasone (FLONASE) 50 MCG/ACT nasal spray Place 2 sprays into both nostrils daily as needed for allergies or rhinitis.     gabapentin (NEURONTIN) 300 MG capsule Take 1 capsule (300 mg total) by mouth at bedtime. 90 capsule 4   loperamide (IMODIUM A-D) 2 MG tablet Take 2-4 mg by mouth 4 (four) times daily as needed for diarrhea or loose stools.     loratadine (CLARITIN) 10 MG tablet Take  10 mg by mouth daily.     methocarbamol (ROBAXIN) 500 MG tablet Take 1 tablet (500 mg total) by mouth every 6 (six) hours as needed for muscle spasms. 40 tablet 0   omeprazole (PRILOSEC) 20 MG capsule Take 20 mg by mouth daily.     oxyCODONE (ROXICODONE) 5 MG immediate release tablet Take 1-2 tablets (5-10 mg total) by mouth every 6 (six) hours as needed for severe pain. 42 tablet 0   rivaroxaban (XARELTO) 10 MG TABS tablet Take 1 tablet (10 mg total) by mouth daily for 21 days. Then take one 81 mg aspirin once a day for three weeks. Then discontinue aspirin. 21 tablet 0   rosuvastatin (CRESTOR) 10 MG tablet Take 1 tablet (10 mg total) by mouth daily. 30 tablet 11   traMADol (ULTRAM) 50 MG tablet Take 1-2 tablets (50-100 mg total) by mouth every 6 (six) hours as needed for moderate pain. 40 tablet 0   traZODone (DESYREL) 50 MG tablet Take 50 mg by mouth at bedtime.     No current facility-administered medications for this visit.    OBJECTIVE: white woman who appears younger than stated age  51:   09/25/21 1344  BP: 140/68  Pulse: 72  Resp: 18  Temp: 98.6 F (37 C)  SpO2: 100%      Body mass index is 25.2 kg/m.   Wt Readings from Last 3 Encounters:  09/25/21 146 lb 12.8 oz (66.6 kg)  10/31/20 141 lb (64 kg)  10/23/20 141 lb (64 kg)      ECOG FS:1 - Symptomatic but completely ambulatory  Sclerae unicteric, EOMs intact Wearing a mask No cervical or supraclavicular adenopathy Lungs no rales or rhonchi Heart regular rate and rhythm Abd soft, nontender, positive bowel sounds MSK no focal spinal tenderness, no upper extremity lymphedema Neuro: nonfocal, well oriented, appropriate affect Breasts: The right breast is benign.  The left breast is status postlumpectomy and radiation.  There is no evidence of disease recurrence.  Both axillae are benign.   LAB RESULTS:  CMP     Component Value Date/Time   NA 140 09/25/2021 1337   K 4.1 09/25/2021 1337   CL 104 09/25/2021  1337   CO2 26 09/25/2021 1337   GLUCOSE 95 09/25/2021 1337   BUN 21 09/25/2021 1337   CREATININE 0.86 09/25/2021 1337   CREATININE 0.83 08/17/2019 0846   CALCIUM 9.9 09/25/2021 1337   PROT 6.9 09/25/2021 1337   ALBUMIN 4.3 09/25/2021 1337   AST 20 09/25/2021 1337  AST 16 08/17/2019 0846   ALT 20 09/25/2021 1337   ALT 17 08/17/2019 0846   ALKPHOS 81 09/25/2021 1337   BILITOT 0.4 09/25/2021 1337   BILITOT 0.4 08/17/2019 0846   GFRNONAA >60 09/25/2021 1337   GFRNONAA >60 08/17/2019 0846   GFRAA >60 04/09/2020 1255   GFRAA >60 08/17/2019 0846    No results found for: TOTALPROTELP, ALBUMINELP, A1GS, A2GS, BETS, BETA2SER, GAMS, MSPIKE, SPEI  No results found for: KPAFRELGTCHN, LAMBDASER, KAPLAMBRATIO  Lab Results  Component Value Date   WBC 6.0 09/25/2021   NEUTROABS 3.5 09/25/2021   HGB 12.2 09/25/2021   HCT 36.8 09/25/2021   MCV 90.0 09/25/2021   PLT 168 09/25/2021   No results found for: LABCA2  No components found for: NFAOZH086  No results for input(s): INR in the last 168 hours.  No results found for: LABCA2  No results found for: VHQ469  No results found for: GEX528  No results found for: UXL244  No results found for: CA2729  No components found for: HGQUANT  No results found for: CEA1 / No results found for: CEA1   No results found for: AFPTUMOR  No results found for: CHROMOGRNA  No results found for: HGBA, HGBA2QUANT, HGBFQUANT, HGBSQUAN (Hemoglobinopathy evaluation)   No results found for: LDH  No results found for: IRON, TIBC, IRONPCTSAT (Iron and TIBC)  No results found for: FERRITIN  Urinalysis No results found for: COLORURINE, APPEARANCEUR, LABSPEC, PHURINE, GLUCOSEU, HGBUR, BILIRUBINUR, KETONESUR, PROTEINUR, UROBILINOGEN, NITRITE, LEUKOCYTESUR   STUDIES: MM DIAG BREAST TOMO BILATERAL  Result Date: 09/03/2021 CLINICAL DATA:  75 year old female for annual follow-up. History of LEFT breast cancer and lumpectomy in 2020. EXAM: DIGITAL  DIAGNOSTIC BILATERAL MAMMOGRAM WITH CAD AND TOMO COMPARISON:  Previous exam(s). ACR Breast Density Category b: There are scattered areas of fibroglandular density. FINDINGS: 2D and 3D full field views of both breasts and a magnification view of the lumpectomy site demonstrate no suspicious mass, nonsurgical distortion or worrisome calcifications. LEFT lumpectomy changes are again noted. Mammographic images were processed with CAD. IMPRESSION: No evidence of breast malignancy. RECOMMENDATION: Per protocol, as the patient is now 2 or more years status post lumpectomy, she may return to annual screening mammography in 1 year. However, given the history of breast cancer, the patient remains eligible for annual diagnostic mammography if preferred. (Code:SM-B-01Y) I have discussed the findings and recommendations with the patient. If applicable, a reminder letter will be sent to the patient regarding the next appointment. BI-RADS CATEGORY  2: Benign. Electronically Signed   By: Margarette Canada M.D.   On: 09/03/2021 16:17     ELIGIBLE FOR AVAILABLE RESEARCH PROTOCOL: no  ASSESSMENT: 75 y.o. [place] woman status post left breast upper outer quadrant biopsy 08/09/2019 for a clinical T1b N0, stage IA invasive ductal carcinoma, grade 2, estrogen and progesterone receptor positive, HER-2 not amplified, with an MIB-1 of 2%.  (1) status post left lumpectomy and sentinel lymph node sampling 08/31/2019 for a pT1a pN0, stage IA invasive ductal carcinoma, with negative margins  (2) adjuvant radiation 10/24/2019-11/18/2019 Site Technique Total Dose (Gy) Dose per Fx (Gy) Completed Fx Beam Energies  Breast, Left: Breast_Lt 3D 40.05/40.05 2.67 15/15 6X  Breast, Left: Breast_Lt_Bst 3D 10/10 2 5/5 6X   (3) started anastrozole 01/08/2020  (a) bone density 08/25/2019 shows a T score of -1.8 ([facility])   PLAN: Susan Randolph is now just over 2 years out from definitive surgery for her breast cancer with no evidence of disease  recurrence.  This  is very favorable.  She is tolerating anastrozole well and the plan is to continue that a total of 5 years.  She was supposed to have had a repeat bone density this past November but for some reason that was not done.  We can certainly catch up on that when she has her next mammogram November 2023.  There may well be an interaction between CBD oil and rosuvastatin.  The people who sells CBD oil do not do any studies regarding effectiveness side effects or drug interactions.  They sell something as a medicine which they tell the FDA is really a food.  This is why I do not like so-called supplements".   However there is at least a little bit of data in the nonmedical Internet that there may be an interaction between CBD oil and rosuvastatin.  Susan Randolph may continue to it at her own discretion  She will return to see [place] in a year.  She knows to call for any other issues that may develop before then.  Total encounter time 20 minutes.Chauncey Cruel, MD   09/25/2021 4:23 PM Medical Oncology and Hematology [facility] [place], [place] [address] Tel. [phone]    Fax. [phone]   This document serves as a record of services personally performed by Lurline Del, MD. It was created on his behalf by Wilburn Mylar, a trained medical scribe. The creation of this record is based on the scribe's personal observations and the provider's statements to them.   I, Lurline Del MD, have reviewed the above documentation for accuracy and completeness, and I agree with the above.   *Total Encounter Time as defined by the Centers for Medicare and Medicaid Services includes, in addition to the face-to-face time of a patient visit (documented in the note above) non-face-to-face time: obtaining and reviewing outside history, ordering and reviewing medications, tests or procedures, care coordination (communications with other health care professionals  or caregivers) and documentation in the medical record.

## 2021-09-25 ENCOUNTER — Inpatient Hospital Stay: Payer: Medicare PPO | Attending: Oncology

## 2021-09-25 ENCOUNTER — Inpatient Hospital Stay: Payer: Medicare PPO | Admitting: Oncology

## 2021-09-25 ENCOUNTER — Other Ambulatory Visit: Payer: Self-pay

## 2021-09-25 VITALS — BP 140/68 | HR 72 | Temp 98.6°F | Resp 18 | Ht 64.0 in | Wt 146.8 lb

## 2021-09-25 DIAGNOSIS — Z170 Estrogen receptor positive status [ER+]: Secondary | ICD-10-CM | POA: Insufficient documentation

## 2021-09-25 DIAGNOSIS — M8580 Other specified disorders of bone density and structure, unspecified site: Secondary | ICD-10-CM | POA: Insufficient documentation

## 2021-09-25 DIAGNOSIS — Z7982 Long term (current) use of aspirin: Secondary | ICD-10-CM | POA: Diagnosis not present

## 2021-09-25 DIAGNOSIS — Z7901 Long term (current) use of anticoagulants: Secondary | ICD-10-CM | POA: Insufficient documentation

## 2021-09-25 DIAGNOSIS — Z79811 Long term (current) use of aromatase inhibitors: Secondary | ICD-10-CM | POA: Insufficient documentation

## 2021-09-25 DIAGNOSIS — Z79899 Other long term (current) drug therapy: Secondary | ICD-10-CM | POA: Diagnosis not present

## 2021-09-25 DIAGNOSIS — F419 Anxiety disorder, unspecified: Secondary | ICD-10-CM | POA: Diagnosis not present

## 2021-09-25 DIAGNOSIS — C50412 Malignant neoplasm of upper-outer quadrant of left female breast: Secondary | ICD-10-CM | POA: Diagnosis not present

## 2021-09-25 DIAGNOSIS — C50919 Malignant neoplasm of unspecified site of unspecified female breast: Secondary | ICD-10-CM | POA: Insufficient documentation

## 2021-09-25 DIAGNOSIS — Z923 Personal history of irradiation: Secondary | ICD-10-CM | POA: Insufficient documentation

## 2021-09-25 LAB — CBC WITH DIFFERENTIAL/PLATELET
Abs Immature Granulocytes: 0.01 10*3/uL (ref 0.00–0.07)
Basophils Absolute: 0 10*3/uL (ref 0.0–0.1)
Basophils Relative: 1 %
Eosinophils Absolute: 0.1 10*3/uL (ref 0.0–0.5)
Eosinophils Relative: 2 %
HCT: 36.8 % (ref 36.0–46.0)
Hemoglobin: 12.2 g/dL (ref 12.0–15.0)
Immature Granulocytes: 0 %
Lymphocytes Relative: 33 %
Lymphs Abs: 2 10*3/uL (ref 0.7–4.0)
MCH: 29.8 pg (ref 26.0–34.0)
MCHC: 33.2 g/dL (ref 30.0–36.0)
MCV: 90 fL (ref 80.0–100.0)
Monocytes Absolute: 0.4 10*3/uL (ref 0.1–1.0)
Monocytes Relative: 6 %
Neutro Abs: 3.5 10*3/uL (ref 1.7–7.7)
Neutrophils Relative %: 58 %
Platelets: 168 10*3/uL (ref 150–400)
RBC: 4.09 MIL/uL (ref 3.87–5.11)
RDW: 12.3 % (ref 11.5–15.5)
WBC: 6 10*3/uL (ref 4.0–10.5)
nRBC: 0 % (ref 0.0–0.2)

## 2021-09-25 LAB — COMPREHENSIVE METABOLIC PANEL
ALT: 20 U/L (ref 0–44)
AST: 20 U/L (ref 15–41)
Albumin: 4.3 g/dL (ref 3.5–5.0)
Alkaline Phosphatase: 81 U/L (ref 38–126)
Anion gap: 10 (ref 5–15)
BUN: 21 mg/dL (ref 8–23)
CO2: 26 mmol/L (ref 22–32)
Calcium: 9.9 mg/dL (ref 8.9–10.3)
Chloride: 104 mmol/L (ref 98–111)
Creatinine, Ser: 0.86 mg/dL (ref 0.44–1.00)
GFR, Estimated: >60 mL/min (ref >60)
Glucose, Bld: 95 mg/dL (ref 70–99)
Potassium: 4.1 mmol/L (ref 3.5–5.1)
Sodium: 140 mmol/L (ref 135–145)
Total Bilirubin: 0.4 mg/dL (ref 0.3–1.2)
Total Protein: 6.9 g/dL (ref 6.5–8.1)

## 2021-10-05 DIAGNOSIS — M47816 Spondylosis without myelopathy or radiculopathy, lumbar region: Secondary | ICD-10-CM | POA: Diagnosis not present

## 2021-10-08 DIAGNOSIS — H401131 Primary open-angle glaucoma, bilateral, mild stage: Secondary | ICD-10-CM | POA: Diagnosis not present

## 2021-10-08 DIAGNOSIS — H35363 Drusen (degenerative) of macula, bilateral: Secondary | ICD-10-CM | POA: Diagnosis not present

## 2021-10-08 DIAGNOSIS — Z9889 Other specified postprocedural states: Secondary | ICD-10-CM | POA: Diagnosis not present

## 2021-10-08 DIAGNOSIS — H25813 Combined forms of age-related cataract, bilateral: Secondary | ICD-10-CM | POA: Diagnosis not present

## 2021-10-09 ENCOUNTER — Other Ambulatory Visit: Payer: Self-pay | Admitting: Cardiology

## 2021-10-22 ENCOUNTER — Telehealth: Payer: Self-pay | Admitting: Cardiology

## 2021-10-22 NOTE — Telephone Encounter (Signed)
Spoke with Pt.  Per Pt she has had a few episodes of chest discomfort.  First episode was on Christmas Eve.  She noticed that her chest was hurting.  She was not active when this began and it resolved shortly after noticing it.  Next episode was last Sunday when she was sitting in church.  She noticed her chest started "hurting pretty bad" and had it in her mind she would go to [facility] urgent [facility] when service was over.  By the time service was over the pain had resolved and she went home.  She realized she was overdue for a follow up with Dr. Marlou Porch and is calling for further advisement.  Chest pain is nonexertional.  No other symptoms.   Made appointment end of January with [facility] at [facility].  Pt aware to call with worsening symptoms.  Pt thanked nurse for call back.

## 2021-10-22 NOTE — Telephone Encounter (Signed)
Pt stated that her indigestion has been getting worse. She stated that when she has the indigestion it causes her chest to hurt. It is hurting more on the left side when she has this pain. She stated that "it is not really chest pains , only when she has the indigestion".   She stated she has not noticed anything else.  She she on omeprazole (PRILOSEC) 20 MG capsule [[id number]]    She was not currently having the indigestion , it was on Sunday during [place]   She is due for a 1 year fu but there was nothing soon witih a PA or Dr Marlou Porch.  She has been getting injection in her back so she is not sure if that may have something to do with it.   She wanted to touch basic with the nurse to see if she it would be ok to wait for Dr Gillian Shields Next available which is in April?    Best number [phone]

## 2021-11-02 ENCOUNTER — Other Ambulatory Visit: Payer: Self-pay | Admitting: Cardiology

## 2021-11-07 DIAGNOSIS — M542 Cervicalgia: Secondary | ICD-10-CM | POA: Diagnosis not present

## 2021-11-08 ENCOUNTER — Other Ambulatory Visit: Payer: Self-pay | Admitting: Student

## 2021-11-08 DIAGNOSIS — M542 Cervicalgia: Secondary | ICD-10-CM

## 2021-11-14 NOTE — Progress Notes (Signed)
Cardiology Office Note:    Date:  11/15/2021   ID:  Susan Randolph, DOB 07/10/1946, MRN [id number]  PCP:  Josetta Huddle, MD   [facility] Providers Cardiologist:  Candee Furbish, MD     Referring MD: Josetta Huddle, MD   Chief Complaint  Patient presents with   Follow-up  Chest pain  History of Present Illness:    Susan Randolph is a 76 y.o. female with a hx of HTN, aortic atherosclerosis by CT, estrogen receptor positive breast cancer.  She was referred to cardiology by PCP for hypertension, review of cardiac prevention and was evaluated by Dr. Marlou Porch on 09/26/2020.  A CT scan of her abdomen and pelvis in October 2018 was personally reviewed by Dr. Marlou Porch and reported to demonstrate evidence of descending aortic atherosclerosis. Due to this finding and an elevated LDL cholesterol, she was started on Crestor 10 mg.  She reported taking antihypertensives in the past which were eventually stopped for blood pressure in the 90s.  Her EKG revealed on his bradycardia at 55 bpm with no other abnormalities noted.  She was encouraged to follow-up in 1 year.  Today, she is here alone for follow-up.  She reports recent chest discomfort, described as "aching." First started noticing this in December 2022. One episode occurred while sitting in church. She planned to go to ED if pain persisted when church ended but it resolved on its own. This episode lasted approximately 20 minutes. Another episode occurred while preparing for Christmas lunch. She was busy working in the kitchen and around the house to prepare. This episode also resolved on its own. States there are other times that she just feels aching in her chest. She also reports increased dyspnea with exertion on occasion. Is able to participate in water aerobics 3 x per week without difficulty. She denies lower extremity edema, fatigue, palpitations, melena, hematuria, hemoptysis, diaphoresis, weakness, presyncope, syncope, orthopnea, and PND. She has  had Covid infection x 3, with the most recent occurring 08/20/21. She took molnupiravir and did not experience significant illness. She completed radiation for breast cancer in January 2021.   Past Medical History:  Diagnosis Date   Anxiety    Arthritis    Breast cancer ([place]) 06/2019   left IMC   Cancer ([place])    basal cell carcinoma   GERD (gastroesophageal reflux disease)    History of radiation therapy 10/24/19- 11/18/19   Left Breast 15 fractions of 2.67 Gy to total 40.05 Gy. Left breast boost 5 fractions of 2 Gy to total 10 Gy   Personal history of radiation therapy 10/2019   Rectal bleed 09/2016    Past Surgical History:  Procedure Laterality Date   BREAST BIOPSY Left 08/2019   BREAST LUMPECTOMY Left 2020   BREAST LUMPECTOMY WITH RADIOACTIVE SEED AND SENTINEL LYMPH NODE BIOPSY Left 08/31/2019   Procedure: LEFT BREAST LUMPECTOMY WITH RADIOACTIVE SEED AND LEFT AXILLARY SENTINEL LYMPH NODE BIOPSY;  Surgeon: Alphonsa Overall, MD;  Location: [place];  Service: General;  Laterality: Left;   EYE SURGERY     Corrected drooping eye lids bilateral   MENISCUS REPAIR Right 03/29/2019   PARTIAL KNEE ARTHROPLASTY Right 10/31/2020   Procedure: Right knee medial unicompartmental arthroplasty;  Surgeon: Gaynelle Arabian, MD;  Location: [facility];  Service: Orthopedics;  Laterality: Right;  30min   POSTERIOR CERVICAL FUSION/FORAMINOTOMY N/A 01/11/2018   Procedure: Posterior Cervical Fusion with lateral mass fixation - Cervical one-Cervical two with Iliac Crest bone graft;  Surgeon:  Earnie Larsson, MD;  Location: [place];  Service: Neurosurgery;  Laterality: N/A;   TUBAL LIGATION      Current Medications: Current Meds  Medication Sig   acetaminophen (TYLENOL) 500 MG tablet Take 1,000 mg by mouth 2 (two) times daily as needed for mild pain.   ALPRAZolam (XANAX) 0.25 MG tablet Take 0.25 mg by mouth daily as needed for anxiety.   anastrozole (ARIMIDEX) 1 MG tablet Take 1 tablet (1 mg total) by  mouth daily.   Calcium Polycarbophil (FIBER-CAPS PO) Take 2 capsules by mouth daily.   diclofenac (VOLTAREN) 50 MG EC tablet Take 50 mg by mouth 2 (two) times daily.   fluticasone (FLONASE) 50 MCG/ACT nasal spray Place 2 sprays into both nostrils daily as needed for allergies or rhinitis.   latanoprost (XALATAN) 0.005 % ophthalmic solution Place 1 drop into both eyes at bedtime.   loperamide (IMODIUM A-D) 2 MG tablet Take 2-4 mg by mouth 4 (four) times daily as needed for diarrhea or loose stools.   loratadine (CLARITIN) 10 MG tablet Take 10 mg by mouth daily.   metoprolol tartrate (LOPRESSOR) 100 MG tablet Take 1 tablet (100 mg total) by mouth once for 1 dose. Take 1 dose 2 hours prior to cardiac CTA!   nitroGLYCERIN (NITROSTAT) 0.4 MG SL tablet Place 1 tablet (0.4 mg total) under the tongue every 5 (five) minutes as needed for chest pain. Place 1 tablet (0.4 mg total) under the tongue every 5 (five) minutes up to 3 times as needed for chest pain.   omeprazole (PRILOSEC) 20 MG capsule Take 20 mg by mouth daily.   rosuvastatin (CRESTOR) 10 MG tablet TAKE 1 TABLET BY MOUTH EVERY DAY   traZODone (DESYREL) 50 MG tablet Take 75 mg by mouth at bedtime.     Allergies:   Amitriptyline and Sulfa antibiotics   Social History   Socioeconomic History   Marital status: Married    Spouse name: Not on file   Number of children: Not on file   Years of education: Not on file   Highest education level: Not on file  Occupational History   Not on file  Tobacco Use   Smoking status: Never   Smokeless tobacco: Never  Vaping Use   Vaping Use: Never used  Substance and Sexual Activity   Alcohol use: Yes    Comment: social   Drug use: No   Sexual activity: Not on file  Other Topics Concern   Not on file  Social History Narrative   Not on file   Social Determinants of Health   Financial Resource Strain: Not on file  Food Insecurity: Not on file  Transportation Needs: Not on file  Physical  Activity: Not on file  Stress: Not on file  Social Connections: Not on file     Family History: The patient's family history includes Arthritis in her father; Breast cancer in her mother; Cancer in her father and mother; Colon cancer in her maternal uncle; Hypertension in her father and mother; Rheum arthritis in her maternal uncle.  ROS:   Please see the history of present illness. All other systems reviewed and are negative.  Labs/Other Studies Reviewed:    The following studies were reviewed today:  CT abdomen 10/18   IMPRESSION: 1.  No acute process or explanation for hematuria. 2.  Aortic Atherosclerosis (ICD10-I70.0). 3. Prominent gonadal veins, as can be seen with pelvic congestion syndrome.    Recent Labs: 09/25/2021: ALT 20; BUN 21; Creatinine, Ser  0.86; Hemoglobin 12.2; Platelets 168; Potassium 4.1; Sodium 140  Recent Lipid Panel    Component Value Date/Time   CHOL 135 12/26/2020 0842   TRIG 125 12/26/2020 0842   HDL 50 12/26/2020 0842   CHOLHDL 2.7 12/26/2020 0842   LDLCALC 63 12/26/2020 0842     Risk Assessment/Calculations:      Physical Exam:    VS:  BP 128/80    Pulse 74    Ht 5\' 4"  (1.626 m)    Wt 143 lb (64.9 kg)    SpO2 99%    BMI 24.55 kg/m     Wt Readings from Last 3 Encounters:  11/15/21 143 lb (64.9 kg)  09/25/21 146 lb 12.8 oz (66.6 kg)  10/31/20 141 lb (64 kg)     GEN:  Well nourished, well developed in no acute distress HEENT: Normal NECK: No JVD; No carotid bruits CARDIAC: RRR, no murmurs, rubs, gallops RESPIRATORY:  Clear to auscultation without rales, wheezing or rhonchi  ABDOMEN: Soft, non-tender, non-distended MUSCULOSKELETAL:  No edema; No deformity. 2+ pedal pulses, equal bilaterally SKIN: Warm and dry NEUROLOGIC:  Alert and oriented x 3 PSYCHIATRIC:  Normal affect   EKG:  EKG is ordered today.  The ekg ordered today demonstrates NSR at rate of 70 bpm, no ST/T wave abnormality  Diagnoses:    1. Dyspnea, unspecified type    2. Precordial pain   3. Aortic atherosclerosis ([place])   4. Essential hypertension   5. Hyperlipidemia LDL goal <70    Assessment and Plan:     Chest pain:  Chest pain symptoms concerning for ischemia. She has known aortic atherosclerosis and hyperlipidemia. Encouraged her to monitor symptoms closely and gave her specific ER precautions. Will give her sublingual nitroglycerin with indications for use.  We will get a coronary CT for coronary anatomy. Will get echocardiogram for dyspnea and for evaluation of wall motion abnormality.   Dyspnea: She reports recent increase in dyspnea on exertion. She is on oral chemotherapy for breast cancer. She appears euvolemic on exam. Will get echo for evaluation of heart and valve function.  Aortic atherosclerosis/Hyperlipidemia LDL goal < 70: Will evaluate for further coronary calcification as noted above with CT. LDL 64 12/26/20. Continue rosuvastatin.   Essential hypertension: BP is stable today. She reports she has been off lisinopril for many years and BP has been well-controlled. Continue to monitor.    Disposition: f/u in 3 months with Dr. Marlou Porch

## 2021-11-15 ENCOUNTER — Encounter (HOSPITAL_BASED_OUTPATIENT_CLINIC_OR_DEPARTMENT_OTHER): Payer: Self-pay | Admitting: Nurse Practitioner

## 2021-11-15 ENCOUNTER — Other Ambulatory Visit: Payer: Self-pay

## 2021-11-15 ENCOUNTER — Ambulatory Visit (HOSPITAL_BASED_OUTPATIENT_CLINIC_OR_DEPARTMENT_OTHER): Payer: Medicare PPO | Admitting: Nurse Practitioner

## 2021-11-15 VITALS — BP 128/80 | HR 74 | Ht 64.0 in | Wt 143.0 lb

## 2021-11-15 DIAGNOSIS — E785 Hyperlipidemia, unspecified: Secondary | ICD-10-CM

## 2021-11-15 DIAGNOSIS — I10 Essential (primary) hypertension: Secondary | ICD-10-CM

## 2021-11-15 DIAGNOSIS — I700 Atherosclerosis of aorta: Secondary | ICD-10-CM | POA: Diagnosis not present

## 2021-11-15 DIAGNOSIS — R0600 Dyspnea, unspecified: Secondary | ICD-10-CM | POA: Diagnosis not present

## 2021-11-15 DIAGNOSIS — R072 Precordial pain: Secondary | ICD-10-CM | POA: Diagnosis not present

## 2021-11-15 MED ORDER — METOPROLOL TARTRATE 100 MG PO TABS: 100.0000 mg | ORAL_TABLET | Freq: Once | ORAL | 0 refills | Status: DC

## 2021-11-15 MED ORDER — NITROGLYCERIN 0.4 MG SL SUBL: 0.4000 mg | SUBLINGUAL_TABLET | SUBLINGUAL | 4 refills | Status: DC | PRN

## 2021-11-15 NOTE — Patient Instructions (Signed)
Medication Instructions:  Your physician has recommended you make the following change in your medication:   Start: Nitroglycerin as needed for chest pain   Take 1 tablet wait 5 minutes if no relief take another wait 5 minutes if no relief take a 3rd one and call 911!    *If you need a refill on your cardiac medications before your next appointment, please call your pharmacy*   Lab Work: Your physician recommends that you return for lab work Today- BMET   If you have labs (blood work) drawn today and your tests are completely normal, you will receive your results only by: MyChart Message (if you have MyChart) OR A paper copy in the mail If you have any lab test that is abnormal or we need to change your treatment, we will call you to review the results.   Testing/Procedures: Your physician has requested that you have an echocardiogram. Echocardiography is a painless test that uses sound waves to create images of your heart. It provides your doctor with information about the size and shape of your heart and how well your hearts chambers and valves are working. This procedure takes approximately one hour. There are no restrictions for this procedure. [place] cardiac CT will be scheduled at one of the below locations:   [facility] [address] [place], [place] [address] [phone]   If scheduled at [facility], please arrive at the [facility] main entrance (entrance A) of [facility] 30 minutes prior to test start time. You can use the FREE valet parking offered at the main entrance (encouraged to control the heart rate for the test) Proceed to the [facility] Radiology Department (first floor) to check-in and test prep.  Please follow these instructions carefully (unless otherwise directed):  On the Night Before the Test: Be sure to Drink plenty of water. Do not consume any caffeinated/decaffeinated  beverages or chocolate 12 hours prior to your test. Do not take any antihistamines 12 hours prior to your test.  On the Day of the Test: Drink plenty of water until 1 hour prior to the test. Do not eat any food 4 hours prior to the test. You may take your regular medications prior to the test.  Take metoprolol (Lopressor) two hours prior to test. FEMALES- please wear underwire-free bra if available, avoid dresses & tight clothing      After the Test: Drink plenty of water. After receiving IV contrast, you may experience a mild flushed feeling. This is normal. On occasion, you may experience a mild rash up to 24 hours after the test. This is not dangerous. If this occurs, you can take Benadryl 25 mg and increase your fluid intake. If you experience trouble breathing, this can be serious. If it is severe call 911 IMMEDIATELY. If it is mild, please call our office. If you take any of these medications: Glipizide/Metformin, Avandament, Glucavance, please do not take 48 hours after completing test unless otherwise instructed.  We will call to schedule your test 2-4 weeks out understanding that some insurance companies will need an authorization prior to the service being performed.   For non-scheduling related questions, please contact the cardiac imaging nurse navigator should you have any questions/concerns: Marchia Bond, Cardiac Imaging Nurse Navigator Gordy Clement, Cardiac Imaging Nurse Navigator [facility] Direct Office Dial: [phone]   For scheduling needs, including cancellations and rescheduling, please call [place], [phone].  Follow-Up: At [facility] , you and your health needs are our priority.  As part of our continuing mission to provide you with exceptional heart care, we have created designated Provider Care Teams.  These Care Teams include your primary Cardiologist (physician) and Advanced Practice Providers (APPs -  Physician  Assistants and Nurse Practitioners) who all work together to provide you with the care you need, when you need it.  We recommend signing up for the patient portal called "MyChart".  Sign up information is provided on this After Visit Summary.  MyChart is used to connect with patients for Virtual Visits (Telemedicine).  Patients are able to view lab/test results, encounter notes, upcoming appointments, etc.  Non-urgent messages can be sent to your provider as well.   To learn more about what you can do with MyChart, go to [email or web address].    Your next appointment:   Follow up as scheduled with Dr. Marlou Porch!

## 2021-11-16 LAB — BASIC METABOLIC PANEL
BUN/Creatinine Ratio: 34 — ABNORMAL HIGH (ref 12–28)
BUN: 24 mg/dL (ref 8–27)
CO2: 24 mmol/L (ref 20–29)
Calcium: 10.6 mg/dL — ABNORMAL HIGH (ref 8.7–10.3)
Chloride: 99 mmol/L (ref 96–106)
Creatinine, Ser: 0.71 mg/dL (ref 0.57–1.00)
Glucose: 83 mg/dL (ref 70–99)
Potassium: 4.7 mmol/L (ref 3.5–5.2)
Sodium: 139 mmol/L (ref 134–144)
eGFR: 89 mL/min/{1.73_m2} (ref >59)

## 2021-11-16 LAB — SPECIMEN STATUS REPORT

## 2021-11-21 ENCOUNTER — Other Ambulatory Visit: Payer: Self-pay

## 2021-11-21 ENCOUNTER — Other Ambulatory Visit (HOSPITAL_BASED_OUTPATIENT_CLINIC_OR_DEPARTMENT_OTHER): Payer: Medicare PPO

## 2021-11-21 MED ORDER — ANASTROZOLE 1 MG PO TABS: 1.0000 mg | ORAL_TABLET | Freq: Every day | ORAL | 3 refills | Status: DC

## 2021-11-22 ENCOUNTER — Other Ambulatory Visit: Payer: Self-pay

## 2021-11-22 ENCOUNTER — Ambulatory Visit (INDEPENDENT_AMBULATORY_CARE_PROVIDER_SITE_OTHER): Payer: Medicare PPO

## 2021-11-22 DIAGNOSIS — R0600 Dyspnea, unspecified: Secondary | ICD-10-CM

## 2021-11-22 LAB — ECHOCARDIOGRAM COMPLETE
AR max vel: 1.47 cm2
AV Area VTI: 1.26 cm2
AV Area mean vel: 1.08 cm2
AV Mean grad: 6 mmHg
AV Peak grad: 11.4 mmHg
Ao pk vel: 1.69 m/s
Area-P 1/2: 3.61 cm2
S' Lateral: 2.96 cm
Single Plane A4C EF: 64.3 %

## 2021-11-27 ENCOUNTER — Telehealth (HOSPITAL_COMMUNITY): Payer: Self-pay | Admitting: Emergency Medicine

## 2021-11-27 ENCOUNTER — Ambulatory Visit
Admission: RE | Admit: 2021-11-27 | Discharge: 2021-11-27 | Disposition: A | Discharge status: Home / Self Care | Payer: Medicare PPO | Source: Ambulatory Visit | Attending: Student | Admitting: Student

## 2021-11-27 ENCOUNTER — Other Ambulatory Visit: Payer: Self-pay

## 2021-11-27 DIAGNOSIS — M542 Cervicalgia: Secondary | ICD-10-CM

## 2021-11-27 DIAGNOSIS — M40202 Unspecified kyphosis, cervical region: Secondary | ICD-10-CM | POA: Diagnosis not present

## 2021-11-27 DIAGNOSIS — M4802 Spinal stenosis, cervical region: Secondary | ICD-10-CM | POA: Diagnosis not present

## 2021-11-27 NOTE — Telephone Encounter (Signed)
Reaching out to patient to offer assistance regarding upcoming cardiac imaging study; pt verbalizes understanding of appt date/time, parking situation and where to check in, pre-test NPO status and medications ordered, and verified current allergies; name and call back number provided for further questions should they arise Marchia Bond RN Navigator Cardiac Imaging [facility] [phone] office [phone] cell  LIMB RESTRICTION- L side 100mg  metoprolol tartrate Arrival 130

## 2021-11-28 ENCOUNTER — Ambulatory Visit (HOSPITAL_COMMUNITY)
Admission: RE | Admit: 2021-11-28 | Discharge: 2021-11-28 | Disposition: A | Discharge status: Home / Self Care | Payer: Medicare PPO | Source: Ambulatory Visit | Attending: Nurse Practitioner | Admitting: Nurse Practitioner

## 2021-11-28 DIAGNOSIS — R072 Precordial pain: Secondary | ICD-10-CM | POA: Diagnosis not present

## 2021-11-28 MED ORDER — IOHEXOL 350 MG/ML SOLN
95.0000 mL | Freq: Once | INTRAVENOUS | Status: AC | PRN
  Administered 2021-11-28: 95 mL via INTRAVENOUS

## 2021-11-28 MED ORDER — NITROGLYCERIN 0.4 MG SL SUBL
0.8000 mg | SUBLINGUAL_TABLET | Freq: Once | SUBLINGUAL | Status: AC
Start: 2021-11-28 — End: 2021-11-28
  Administered 2021-11-28: 0.8 mg via SUBLINGUAL

## 2021-11-28 MED ORDER — NITROGLYCERIN 0.4 MG SL SUBL
SUBLINGUAL_TABLET | SUBLINGUAL | Status: AC
  Filled 2021-11-28: qty 2

## 2021-12-03 DIAGNOSIS — M47812 Spondylosis without myelopathy or radiculopathy, cervical region: Secondary | ICD-10-CM | POA: Diagnosis not present

## 2021-12-06 DIAGNOSIS — N941 Unspecified dyspareunia: Secondary | ICD-10-CM | POA: Diagnosis not present

## 2021-12-06 DIAGNOSIS — Z853 Personal history of malignant neoplasm of breast: Secondary | ICD-10-CM | POA: Diagnosis not present

## 2021-12-06 DIAGNOSIS — Z01419 Encounter for gynecological examination (general) (routine) without abnormal findings: Secondary | ICD-10-CM | POA: Diagnosis not present

## 2021-12-19 DIAGNOSIS — R82998 Other abnormal findings in urine: Secondary | ICD-10-CM | POA: Diagnosis not present

## 2021-12-19 DIAGNOSIS — D72829 Elevated white blood cell count, unspecified: Secondary | ICD-10-CM | POA: Diagnosis not present

## 2021-12-19 DIAGNOSIS — R059 Cough, unspecified: Secondary | ICD-10-CM | POA: Diagnosis not present

## 2021-12-19 DIAGNOSIS — R55 Syncope and collapse: Secondary | ICD-10-CM | POA: Diagnosis not present

## 2021-12-19 DIAGNOSIS — Z20822 Contact with and (suspected) exposure to covid-19: Secondary | ICD-10-CM | POA: Diagnosis not present

## 2021-12-19 DIAGNOSIS — R0602 Shortness of breath: Secondary | ICD-10-CM | POA: Diagnosis not present

## 2021-12-19 DIAGNOSIS — R918 Other nonspecific abnormal finding of lung field: Secondary | ICD-10-CM | POA: Diagnosis not present

## 2021-12-19 DIAGNOSIS — E860 Dehydration: Secondary | ICD-10-CM | POA: Diagnosis not present

## 2021-12-19 DIAGNOSIS — R509 Fever, unspecified: Secondary | ICD-10-CM | POA: Diagnosis not present

## 2021-12-20 DIAGNOSIS — R55 Syncope and collapse: Secondary | ICD-10-CM | POA: Diagnosis not present

## 2021-12-20 DIAGNOSIS — E860 Dehydration: Secondary | ICD-10-CM | POA: Diagnosis not present

## 2021-12-25 ENCOUNTER — Ambulatory Visit
Admission: RE | Admit: 2021-12-25 | Discharge: 2021-12-25 | Disposition: A | Discharge status: Home / Self Care | Payer: Medicare PPO | Source: Ambulatory Visit | Attending: Internal Medicine | Admitting: Internal Medicine

## 2021-12-25 ENCOUNTER — Other Ambulatory Visit: Payer: Self-pay | Admitting: Internal Medicine

## 2021-12-25 DIAGNOSIS — T17800D Unspecified foreign body in other parts of respiratory tract causing asphyxiation, subsequent encounter: Secondary | ICD-10-CM

## 2021-12-25 DIAGNOSIS — J439 Emphysema, unspecified: Secondary | ICD-10-CM | POA: Diagnosis not present

## 2021-12-25 DIAGNOSIS — R918 Other nonspecific abnormal finding of lung field: Secondary | ICD-10-CM | POA: Diagnosis not present

## 2021-12-25 DIAGNOSIS — R051 Acute cough: Secondary | ICD-10-CM | POA: Diagnosis not present

## 2021-12-25 DIAGNOSIS — R0602 Shortness of breath: Secondary | ICD-10-CM | POA: Diagnosis not present

## 2021-12-31 ENCOUNTER — Other Ambulatory Visit: Payer: Self-pay

## 2021-12-31 ENCOUNTER — Other Ambulatory Visit: Payer: Self-pay | Admitting: Internal Medicine

## 2021-12-31 ENCOUNTER — Ambulatory Visit
Admission: RE | Admit: 2021-12-31 | Discharge: 2021-12-31 | Disposition: A | Discharge status: Home / Self Care | Payer: Medicare PPO | Source: Ambulatory Visit | Attending: Internal Medicine | Admitting: Internal Medicine

## 2021-12-31 DIAGNOSIS — Z1211 Encounter for screening for malignant neoplasm of colon: Secondary | ICD-10-CM | POA: Diagnosis not present

## 2021-12-31 DIAGNOSIS — R0609 Other forms of dyspnea: Secondary | ICD-10-CM

## 2021-12-31 DIAGNOSIS — R55 Syncope and collapse: Secondary | ICD-10-CM | POA: Diagnosis not present

## 2021-12-31 DIAGNOSIS — E559 Vitamin D deficiency, unspecified: Secondary | ICD-10-CM | POA: Diagnosis not present

## 2021-12-31 DIAGNOSIS — Z8371 Family history of colonic polyps: Secondary | ICD-10-CM | POA: Diagnosis not present

## 2021-12-31 DIAGNOSIS — R142 Eructation: Secondary | ICD-10-CM | POA: Diagnosis not present

## 2021-12-31 DIAGNOSIS — K219 Gastro-esophageal reflux disease without esophagitis: Secondary | ICD-10-CM | POA: Diagnosis not present

## 2021-12-31 DIAGNOSIS — R1310 Dysphagia, unspecified: Secondary | ICD-10-CM | POA: Diagnosis not present

## 2021-12-31 DIAGNOSIS — I10 Essential (primary) hypertension: Secondary | ICD-10-CM | POA: Diagnosis not present

## 2021-12-31 DIAGNOSIS — G43009 Migraine without aura, not intractable, without status migrainosus: Secondary | ICD-10-CM | POA: Diagnosis not present

## 2021-12-31 DIAGNOSIS — G4700 Insomnia, unspecified: Secondary | ICD-10-CM | POA: Diagnosis not present

## 2021-12-31 DIAGNOSIS — M5030 Other cervical disc degeneration, unspecified cervical region: Secondary | ICD-10-CM | POA: Diagnosis not present

## 2021-12-31 DIAGNOSIS — Z0000 Encounter for general adult medical examination without abnormal findings: Secondary | ICD-10-CM | POA: Diagnosis not present

## 2022-01-21 ENCOUNTER — Ambulatory Visit: Payer: Medicare PPO | Admitting: Pulmonary Disease

## 2022-01-21 ENCOUNTER — Encounter: Payer: Self-pay | Admitting: Pulmonary Disease

## 2022-01-21 VITALS — BP 150/82 | HR 70 | Temp 98.7°F | Ht 64.0 in | Wt 144.2 lb

## 2022-01-21 DIAGNOSIS — R0602 Shortness of breath: Secondary | ICD-10-CM | POA: Diagnosis not present

## 2022-01-21 DIAGNOSIS — T17908S Unspecified foreign body in respiratory tract, part unspecified causing other injury, sequela: Secondary | ICD-10-CM

## 2022-01-21 MED ORDER — ALBUTEROL SULFATE HFA 108 (90 BASE) MCG/ACT IN AERS: 2.0000 | INHALATION_SPRAY | Freq: Four times a day (QID) | RESPIRATORY_TRACT | 0 refills | Status: DC | PRN

## 2022-01-21 NOTE — Patient Instructions (Addendum)
Shortness of breath ?--ARRANGE with pulmonary function tests ?--Consider CT Chest pending results ?--Encourage regular aerobic activity daily ? ?Follow-up with me after PFTs in May ?

## 2022-01-21 NOTE — Progress Notes (Signed)
Patient she has a video ? ? ?Subjective:  ? ?PATIENT ID: Susan Randolph GENDER: female DOB: Feb 20, 1946, MRN: [id number] ? ? ?HPI ? ?Chief Complaint  ?Patient presents with  ? Consult  ?  Consult.   ? ? ?Reason for Visit: New consult for possible emphysema ? ?[name] is a 76 year old female with history of left breast cancer s/p lumpectomy in 2020, allergic rhinitis, hypertension, GERD, osteoarthritis who presents as a new consultation for emphysema. ? ?Faxed records reviewed. Note from 12/31/21 by Dr. Inda Merlin from [place]. Seen for CME. CXR concerning for possible emphysema and scarring vs aspiration. ? ?She reports recent syncope one month ago followed by aspiration. [facility] CXR 3/2 with airspace opacities in left lung base concerning for atelectasis/aspiration/infection. ? ?She reports hx of COVID x 3 with including Dec 2020 and last illness in Nov 2022 which she contracted in [place] so a different strain. She is vaccinated x 4. She has had shortness of breath since 2020 which has gradually worsened in the last several months associated with significant fatigue. Some coughing however not associated with dyspnea. No wheezing. Going grocery shopping can be taxing and activity is limited due to her endurance. Previously did water aerobics. She had recent cardiology evaluation demonstrating grade II diastolic dysfunction. Scheduled with Dr. Marlou Porch. ? ?Social History: ?Never smoker ?Second smoke exposure ?Wood burning stove x 2-3 years ? ?I have personally reviewed patient's past medical/family/social history, allergies, current medications. ? ?Past Medical History:  ?Diagnosis Date  ? Anxiety   ? Arthritis   ? Breast cancer ([place]) 06/2019  ? left IMC  ? Cancer [facility])   ? basal cell carcinoma  ? GERD (gastroesophageal reflux disease)   ? History of radiation therapy 10/24/19- 11/18/19  ? Left Breast 15 fractions of 2.67 Gy to total 40.05 Gy. Left breast boost 5 fractions of 2 Gy to total 10 Gy  ? Personal history  of radiation therapy 10/2019  ? Rectal bleed 09/2016  ?  ? ?Family History  ?Problem Relation Age of Onset  ? Cancer Mother   ?     breast cancer/endometrial cancer  ? Hypertension Mother   ? Breast cancer Mother   ? Hypertension Father   ? Cancer Father   ?     Glioblastoma  ? Arthritis Father   ? Rheum arthritis Maternal Uncle   ? Colon cancer Maternal Uncle   ?  ? ?Social History  ? ?Occupational History  ? Not on file  ?Tobacco Use  ? Smoking status: Never  ? Smokeless tobacco: Never  ?Vaping Use  ? Vaping Use: Never used  ?Substance and Sexual Activity  ? Alcohol use: Yes  ?  Comment: social  ? Drug use: No  ? Sexual activity: Not on file  ? ? ?Allergies  ?Allergen Reactions  ? Amitriptyline Other (See Comments)  ?  tremors  ? Sulfa Antibiotics Other (See Comments)  ?  Doesn't remember, happened in childhood  ?  ? ?Outpatient Medications Prior to Visit  ?Medication Sig Dispense Refill  ? acetaminophen (TYLENOL) 500 MG tablet Take 1,000 mg by mouth 2 (two) times daily as needed for mild pain.    ? ALPRAZolam (XANAX) 0.25 MG tablet Take 0.25 mg by mouth daily as needed for anxiety.    ? anastrozole (ARIMIDEX) 1 MG tablet Take 1 tablet (1 mg total) by mouth daily. 90 tablet 3  ? Calcium Polycarbophil (FIBER-CAPS PO) Take 2 capsules by mouth daily.    ? diclofenac (  VOLTAREN) 50 MG EC tablet Take 50 mg by mouth 2 (two) times daily.    ? fluticasone (FLONASE) 50 MCG/ACT nasal spray Place 2 sprays into both nostrils daily as needed for allergies or rhinitis.    ? latanoprost (XALATAN) 0.005 % ophthalmic solution Place 1 drop into both eyes at bedtime.    ? loperamide (IMODIUM A-D) 2 MG tablet Take 2-4 mg by mouth 4 (four) times daily as needed for diarrhea or loose stools.    ? loratadine (CLARITIN) 10 MG tablet Take 10 mg by mouth daily.    ? nitroGLYCERIN (NITROSTAT) 0.4 MG SL tablet Place 1 tablet (0.4 mg total) under the tongue every 5 (five) minutes as needed for chest pain. Place 1 tablet (0.4 mg total) under  the tongue every 5 (five) minutes up to 3 times as needed for chest pain. 25 tablet 4  ? omeprazole (PRILOSEC) 20 MG capsule Take 20 mg by mouth daily.    ? rosuvastatin (CRESTOR) 10 MG tablet TAKE 1 TABLET BY MOUTH EVERY DAY 30 tablet 2  ? traZODone (DESYREL) 50 MG tablet Take 75 mg by mouth at bedtime.    ? metoprolol tartrate (LOPRESSOR) 100 MG tablet Take 1 tablet (100 mg total) by mouth once for 1 dose. Take 1 dose 2 hours prior to cardiac CTA! 1 tablet 0  ? ?No facility-administered medications prior to visit.  ? ? ?Review of Systems  ?Constitutional:  Negative for chills, diaphoresis, fever, malaise/fatigue and weight loss.  ?HENT:  Negative for congestion.   ?Respiratory:  Positive for cough and shortness of breath. Negative for hemoptysis, sputum production and wheezing.   ?Cardiovascular:  Negative for chest pain, palpitations and leg swelling.  ? ? ?Objective:  ? ?Vitals:  ? 01/21/22 0935  ?BP: (!) 150/82  ?Pulse: 70  ?Temp: 98.7 ?F (37.1 ?C)  ?TempSrc: Oral  ?SpO2: 97%  ?Weight: 144 lb 3.2 oz (65.4 kg)  ?Height: '5\' 4"'$  (1.626 m)  ? ?SpO2: 97 % ?O2 Device: None (Room air) ? ?Physical Exam: ?General: Well-appearing, no acute distress ?HENT: [place], AT ?Eyes: EOMI, no scleral icterus ?Respiratory: Clear to auscultation bilaterally.  No crackles, wheezing or rales ?Cardiovascular: RRR, -M/R/G, no JVD ?Extremities:-Edema,-tenderness ?Neuro: AAO x4, CNII-XII grossly intact ?Psych: Normal mood, normal affect ? ?Data Reviewed: ? ?Imaging: ?CT coronary 11/28/2021-visualized lung fields with mild bibasilar this is ?CXR 12/31/2021-no acute cardiopulmonary disease ? ?PFT: ?None on file ? ?Labs: ?CBC ?   ?Component Value Date/Time  ? WBC 6.0 09/25/2021 1337  ? RBC 4.09 09/25/2021 1337  ? HGB 12.2 09/25/2021 1337  ? HGB 12.9 08/17/2019 0846  ? HCT 36.8 09/25/2021 1337  ? PLT 168 09/25/2021 1337  ? PLT 204 08/17/2019 0846  ? MCV 90.0 09/25/2021 1337  ? MCH 29.8 09/25/2021 1337  ? MCHC 33.2 09/25/2021 1337  ? RDW 12.3  09/25/2021 1337  ? LYMPHSABS 2.0 09/25/2021 1337  ? MONOABS 0.4 09/25/2021 1337  ? EOSABS 0.1 09/25/2021 1337  ? BASOSABS 0.0 09/25/2021 1337  ? ?Absolute eos 09/25/21 - 100 ? ?   ?Assessment & Plan:  ? ?Discussion: ?76 year old female never smoker with history of left breast cancer s/p lumpectomy in 2020, allergic rhinitis, hypertension, GERD, osteoarthritis who presents as a new consultation for emphysema. Recent CXR reviewed with mild hyperinflation. Symptomatic. Possibly related COVID-long hauler vs scarring from recent aspiration. Will evaluate with PFTs. If normal this is reassuring. ? ?Shortness of breath ?--ARRANGE with pulmonary function tests ?--Consider CT Chest pending results ? ?Health  Maintenance ?Immunization History  ?Administered Date(s) Administered  ? Influenza, High Dose Seasonal PF 09/20/2021  ? Influenza,inj,quad, With Preservative 06/20/2018, 07/21/2019  ? PFIZER(Purple Top)SARS-COV-2 Vaccination 11/28/2019, 12/23/2019  ? ? ?Orders Placed This Encounter  ?Procedures  ? Pulmonary function test  ?  Standing Status:   Future  ?  Standing Expiration Date:   01/22/2023  ?  Order Specific Question:   Where should this test be performed?  ?  Answer:   [facility]  ?  Order Specific Question:   Full PFT: includes the following: basic spirometry, spirometry pre & post bronchodilator, diffusion capacity (DLCO), lung volumes  ?  Answer:   Full PFT  ? ?Meds ordered this encounter  ?Medications  ? albuterol (VENTOLIN HFA) 108 (90 Base) MCG/ACT inhaler  ?  Sig: Inhale 2 puffs into the lungs every 6 (six) hours as needed for wheezing or shortness of breath.  ?  Dispense:  8 g  ?  Refill:  0  ? ? ?Return in about 1 month (around 02/20/2022). ? ?I have spent a total time of 45-minutes on the day of the appointment reviewing prior documentation, coordinating care and discussing medical diagnosis and plan with the patient/family. Imaging, labs and tests included in this note have been reviewed and interpreted  independently by me. ? ?Gina Costilla Rodman Pickle, MD ?[facility] ?01/21/2022 10:28 AM  ?Office Number [phone] ? ? ?

## 2022-01-23 ENCOUNTER — Encounter: Payer: Self-pay | Admitting: Cardiology

## 2022-01-23 ENCOUNTER — Ambulatory Visit: Payer: Medicare PPO | Admitting: Cardiology

## 2022-01-23 DIAGNOSIS — I10 Essential (primary) hypertension: Secondary | ICD-10-CM | POA: Diagnosis not present

## 2022-01-23 DIAGNOSIS — R55 Syncope and collapse: Secondary | ICD-10-CM

## 2022-01-23 DIAGNOSIS — I5189 Other ill-defined heart diseases: Secondary | ICD-10-CM | POA: Diagnosis not present

## 2022-01-23 DIAGNOSIS — I2510 Atherosclerotic heart disease of native coronary artery without angina pectoris: Secondary | ICD-10-CM | POA: Diagnosis not present

## 2022-01-23 DIAGNOSIS — R0602 Shortness of breath: Secondary | ICD-10-CM | POA: Diagnosis not present

## 2022-01-23 NOTE — Assessment & Plan Note (Signed)
Mild nonobstructive coronary artery disease.  50th percentile coronary calcium. ?Continue with Crestor for plaque stabilization.  Last LDL 49.  Excellent.  No myalgias. ?

## 2022-01-23 NOTE — Patient Instructions (Signed)
Medication Instructions:  ?The current medical regimen is effective;  continue present plan and medications. ? ?*If you need a refill on your cardiac medications before your next appointment, please call your pharmacy* ? ?Follow-Up: ?At [facility], you and your health needs are our priority.  As part of our continuing mission to provide you with exceptional heart care, we have created designated Provider Care Teams.  These Care Teams include your primary Cardiologist (physician) and Advanced Practice Providers (APPs -  Physician Assistants and Nurse Practitioners) who all work together to provide you with the care you need, when you need it. ? ?We recommend signing up for the patient portal called "MyChart".  Sign up information is provided on this After Visit Summary.  MyChart is used to connect with patients for Virtual Visits (Telemedicine).  Patients are able to view lab/test results, encounter notes, upcoming appointments, etc.  Non-urgent messages can be sent to your provider as well.   ?To learn more about what you can do with MyChart, go to [email or web address].   ? ?Your next appointment:   ?6 month(s) ? ?The format for your next appointment:   ?In Person ? ?Provider:   ?Christen Bame, NP       ? ? ?Thank you for choosing [facility]!! ? ? ? ?

## 2022-01-23 NOTE — Assessment & Plan Note (Signed)
Enjoyed seeing pulmonary.  PFTs are upcoming.  1 chest x-ray was personally reviewed, hyperinflation of lungs/emphysematous changes potentially noted.  Explained to them how this was fairly nonspecific.  She did have aspiration during the vasovagal episode. ?

## 2022-01-23 NOTE — Assessment & Plan Note (Signed)
Grade 2 diastolic dysfunction seen on echocardiogram.  Personally reviewed images as well.  We discussed the possibility of utilizing HCTZ 12.5 mg once a day.  For now, we will hold off.  Obviously if shortness of breath becomes more of an issue we can always push forward. ?

## 2022-01-23 NOTE — Assessment & Plan Note (Signed)
Off of lisinopril.  Has been doing well.  Continue to monitor. ?

## 2022-01-23 NOTE — Assessment & Plan Note (Signed)
Had an episode at [facility] hot, flushed, faint.  Was observed overnight.  Extensive work-up, unremarkable.  Continue with adequate hydration. ?

## 2022-01-23 NOTE — Progress Notes (Signed)
?Cardiology Office Note:   ? ?Date:  01/23/2022  ? ?ID:  Susan Randolph, DOB 28-May-1946, MRN [id number] ? ?PCP:  Josetta Huddle, MD  ?[place] Cardiologist:  Candee Furbish, MD  ?[facility] Electrophysiologist:  None  ? ?Referring MD: Josetta Huddle, MD  ? ? ? ?History of Present Illness:   ? ?Susan Randolph is a 76 y.o. female with history of estrogen receptor positive breast cancer followed by Dr. Jana Hakim here for the evaluation of hypertension, review of cardiac prevention.  Dr. Inda Merlin is her primary doctor. ? ?Since her last visit she was in the ED on 12/19/2021 for syncope. Her CXR showed LLL atelectasis versus aspiration. ? ?Has family history: Mother AFIB and TIA. Grandfathers had heart issues. Wants prevention. Has [name] watch. Her father had a seizure and passed away from a aneurism. ? ?A  previous CT scan of her abdomen pelvis on 07/27/2017 was personally reviewed and does demonstrate evidence of descending aortic atherosclerosis. ? ?Took HTN meds for many years. BP was really low 90's. Lisinopril was stopped by Dr. Hans Eden.  ? ?At the previous visit on Jan 12th she was experienced right knee surgery.  ?Prior C1-2 fusion by Dr. Trenton Gammon ? ?Prior COVID virus 3 different times, latest in November of 2022. ? ?Last visit notes from Dr. Inda Merlin reviewed.  Never smoked. ? ?Today: ?Overall, she is feeling well.She is accompanied by her husband, [name]. [place], who also had an appointment today and are being seen together. ? ?Recently she experienced some chest discomfort and sob. Yesterday she walked 3.3 miles and 3 miles today. After walking the 3 miles she experienced fatigue but was not as limited by her sob. ? ?She reports that her blood pressure was well controlled at home. ? ?She denies taking Asprin due to her husband's ecchymosis from it.  ? ?The pt experienced syncope and aspirations recently and was admitted ito the hospital. In May she has a Pulmonary appointment to complete a pulmonary function test. At  fist, she thought she was experiencing food poisoning. ? ?She denies any palpitations, or peripheral edema. No lightheadedness, headaches, orthopnea, or PND. ? ?  ?Past Medical History:  ?Diagnosis Date  ? Anxiety   ? Arthritis   ? Breast cancer ([place]) 06/2019  ? left IMC  ? Cancer [facility])   ? basal cell carcinoma  ? GERD (gastroesophageal reflux disease)   ? History of radiation therapy 10/24/19- 11/18/19  ? Left Breast 15 fractions of 2.67 Gy to total 40.05 Gy. Left breast boost 5 fractions of 2 Gy to total 10 Gy  ? Personal history of radiation therapy 10/2019  ? Rectal bleed 09/2016  ? ? ?Past Surgical History:  ?Procedure Laterality Date  ? BREAST BIOPSY Left 08/2019  ? BREAST LUMPECTOMY Left 2020  ? BREAST LUMPECTOMY WITH RADIOACTIVE SEED AND SENTINEL LYMPH NODE BIOPSY Left 08/31/2019  ? Procedure: LEFT BREAST LUMPECTOMY WITH RADIOACTIVE SEED AND LEFT AXILLARY SENTINEL LYMPH NODE BIOPSY;  Surgeon: Alphonsa Overall, MD;  Location: [facility];  Service: General;  Laterality: Left;  ? EYE SURGERY    ? Corrected drooping eye lids bilateral  ? MENISCUS REPAIR Right 03/29/2019  ? PARTIAL KNEE ARTHROPLASTY Right 10/31/2020  ? Procedure: Right knee medial unicompartmental arthroplasty;  Surgeon: Gaynelle Arabian, MD;  Location: [facility];  Service: Orthopedics;  Laterality: Right;  50mn  ? POSTERIOR CERVICAL FUSION/FORAMINOTOMY N/A 01/11/2018  ? Procedure: Posterior Cervical Fusion with lateral mass fixation - Cervical one-Cervical two with Iliac Crest  bone graft;  Surgeon: Earnie Larsson, MD;  Location: [facility];  Service: Neurosurgery;  Laterality: N/A;  ? TUBAL LIGATION    ? ? ?Current Medications: ?Current Meds  ?Medication Sig  ? acetaminophen (TYLENOL) 500 MG tablet Take 1,000 mg by mouth 2 (two) times daily as needed for mild pain.  ? albuterol (VENTOLIN HFA) 108 (90 Base) MCG/ACT inhaler Inhale 2 puffs into the lungs every 6 (six) hours as needed for wheezing or shortness of breath.  ? ALPRAZolam (XANAX) 0.25 MG  tablet Take 0.25 mg by mouth daily as needed for anxiety.  ? anastrozole (ARIMIDEX) 1 MG tablet Take 1 tablet (1 mg total) by mouth daily.  ? Calcium Polycarbophil (FIBER-CAPS PO) Take 2 capsules by mouth daily.  ? diclofenac (VOLTAREN) 50 MG EC tablet Take 50 mg by mouth 2 (two) times daily.  ? Diclofenac 35 MG CAPS 1 capsule as needed  ? fluticasone (FLONASE) 50 MCG/ACT nasal spray Place 2 sprays into both nostrils daily as needed for allergies or rhinitis.  ? latanoprost (XALATAN) 0.005 % ophthalmic solution Place 1 drop into both eyes at bedtime.  ? latanoprost (XALATAN) 0.005 % ophthalmic solution 1 drop into affected eye in the evening  ? loperamide (IMODIUM A-D) 2 MG tablet Take 2-4 mg by mouth 4 (four) times daily as needed for diarrhea or loose stools.  ? loratadine (CLARITIN) 10 MG tablet Take 10 mg by mouth daily.  ? Magnesium 250 MG TABS 1 tablet with a meal  ? nitroGLYCERIN (NITROSTAT) 0.4 MG SL tablet Place 1 tablet (0.4 mg total) under the tongue every 5 (five) minutes as needed for chest pain. Place 1 tablet (0.4 mg total) under the tongue every 5 (five) minutes up to 3 times as needed for chest pain.  ? omeprazole (PRILOSEC) 20 MG capsule Take 20 mg by mouth daily.  ? rosuvastatin (CRESTOR) 10 MG tablet TAKE 1 TABLET BY MOUTH EVERY DAY  ? SYSTANE OVERNIGHT THERAPY 0.3 % GEL ophthalmic ointment SMARTSIG:1 Drop(s) In Eye(s) Every Evening  ? SYSTANE ULTRA 0.4-0.3 % SOLN SMARTSIG:1 Drop(s) In Eye(s) PRN  ? traZODone (DESYREL) 50 MG tablet Take 75 mg by mouth at bedtime.  ?  ? ?Allergies:   Amitriptyline and Sulfa antibiotics  ? ?Social History  ? ?Socioeconomic History  ? Marital status: Married  ?  Spouse name: Not on file  ? Number of children: Not on file  ? Years of education: Not on file  ? Highest education level: Not on file  ?Occupational History  ? Not on file  ?Tobacco Use  ? Smoking status: Never  ? Smokeless tobacco: Never  ?Vaping Use  ? Vaping Use: Never used  ?Substance and Sexual  Activity  ? Alcohol use: Yes  ?  Comment: social  ? Drug use: No  ? Sexual activity: Not on file  ?Other Topics Concern  ? Not on file  ?Social History Narrative  ? Not on file  ? ?Social Determinants of Health  ? ?Financial Resource Strain: Not on file  ?Food Insecurity: Not on file  ?Transportation Needs: Not on file  ?Physical Activity: Not on file  ?Stress: Not on file  ?Social Connections: Not on file  ?  ? ?Family History: ?The patient's family history includes Arthritis in her father; Breast cancer in her mother; Cancer in her father and mother; Colon cancer in her maternal uncle; Hypertension in her father and mother; Rheum arthritis in her maternal uncle. ? ?ROS:   ?Please see the history  of present illness.    ?(+) Chest Discomfort ?(+) Shortness of Breath ?(+) Syncope ?(+) Aspirations ?(+) Fatigue ?All other systems reviewed and are negative. ? ?EKGs/Labs/Other Studies Reviewed:   ? ?The following studies were reviewed today: ? ?CT Coronary Morph 11/28/2021: ?FINDINGS: ?Asymmetric density in the left breast immediately anterior to the ?pectoralis muscle with adjacent clips, likely representing site of ?prior biopsy and lumpectomy in 2020. Mild dependent atelectasis in ?both lower lobes. ?  ?IMPRESSION: ?1. Prior lumpectomy site in the left breast noted. ?2. Mild dependent atelectasis in both lower lobes. ? ?Echo 11/22/2021: ?IMPRESSIONS  ? ? 1. Left ventricular ejection fraction, by estimation, is 65 to 70%. The  ?left ventricle has normal function. The left ventricle has no regional  ?wall motion abnormalities. Left ventricular diastolic parameters are  ?consistent with Grade II diastolic  ?dysfunction (pseudonormalization).  ? 2. Right ventricular systolic function is normal. The right ventricular  ?size is normal. There is normal pulmonary artery systolic pressure.  ? 3. Left atrial size was moderately dilated.  ? 4. Right atrial size was mildly dilated.  ? 5. A small pericardial effusion is present. There  is no evidence of  ?cardiac tamponade.  ? 6. The mitral valve is normal in structure. Trivial mitral valve  ?regurgitation. No evidence of mitral stenosis.  ? 7. The aortic valve is tricuspid. Aortic valve regu

## 2022-01-31 ENCOUNTER — Other Ambulatory Visit: Payer: Self-pay | Admitting: Cardiology

## 2022-02-17 ENCOUNTER — Other Ambulatory Visit: Payer: Self-pay | Admitting: Pulmonary Disease

## 2022-03-10 ENCOUNTER — Ambulatory Visit (INDEPENDENT_AMBULATORY_CARE_PROVIDER_SITE_OTHER): Payer: Medicare PPO | Admitting: Pulmonary Disease

## 2022-03-10 ENCOUNTER — Ambulatory Visit: Payer: Medicare PPO | Admitting: Pulmonary Disease

## 2022-03-10 ENCOUNTER — Encounter: Payer: Self-pay | Admitting: Pulmonary Disease

## 2022-03-10 VITALS — BP 120/62 | HR 62 | Temp 97.9°F | Ht 64.0 in | Wt 143.0 lb

## 2022-03-10 DIAGNOSIS — R0602 Shortness of breath: Secondary | ICD-10-CM | POA: Diagnosis not present

## 2022-03-10 LAB — PULMONARY FUNCTION TEST
DL/VA % pred: 112 %
DL/VA: 4.61 ml/min/mmHg/L
DLCO cor % pred: 104 %
DLCO cor: 19.98 ml/min/mmHg
DLCO unc % pred: 104 %
DLCO unc: 19.98 ml/min/mmHg
FEF 25-75 Post: 3.63 L/sec
FEF 25-75 Pre: 3.13 L/sec
FEF2575-%Change-Post: 15 %
FEF2575-%Pred-Post: 218 %
FEF2575-%Pred-Pre: 188 %
FEV1-%Change-Post: 5 %
FEV1-%Pred-Post: 107 %
FEV1-%Pred-Pre: 102 %
FEV1-Post: 2.27 L
FEV1-Pre: 2.16 L
FEV1FVC-%Change-Post: -1 %
FEV1FVC-%Pred-Pre: 126 %
FEV6-%Change-Post: 7 %
FEV6-%Pred-Post: 91 %
FEV6-%Pred-Pre: 85 %
FEV6-Post: 2.45 L
FEV6-Pre: 2.28 L
FEV6FVC-%Pred-Post: 105 %
FEV6FVC-%Pred-Pre: 105 %
FVC-%Change-Post: 7 %
FVC-%Pred-Post: 86 %
FVC-%Pred-Pre: 81 %
FVC-Post: 2.45 L
FVC-Pre: 2.28 L
Post FEV1/FVC ratio: 93 %
Post FEV6/FVC ratio: 100 %
Pre FEV1/FVC ratio: 95 %
Pre FEV6/FVC Ratio: 100 %
RV % pred: 68 %
RV: 1.57 L
TLC % pred: 86 %
TLC: 4.35 L

## 2022-03-10 NOTE — Progress Notes (Signed)
Full PFT performed today. °

## 2022-03-10 NOTE — Patient Instructions (Signed)
Full PFT performed today. °

## 2022-03-10 NOTE — Patient Instructions (Signed)
Follow up with me as needed.

## 2022-03-10 NOTE — Progress Notes (Signed)
Patient she has a video   Subjective:   PATIENT ID: Susan Randolph GENDER: female DOB: May 08, 1946, MRN: [id number]   HPI  Chief Complaint  Patient presents with   Follow-up    PFT today.     Reason for Visit: PFT follow-up  [name] is a 76 year old female with history of left breast cancer s/p lumpectomy in 2020, allergic rhinitis, hypertension, GERD, osteoarthritis who presents for follow-up.  Initial consult Faxed records reviewed. Note from 12/31/21 by Dr. Inda Merlin from [facility]. Seen for CME. CXR concerning for possible emphysema and scarring vs aspiration.  She reports recent syncope one month ago followed by aspiration. [facility] CXR 3/2 with airspace opacities in left lung base concerning for atelectasis/aspiration/infection.  She reports hx of COVID x 3 with including Dec 2020 and last illness in Nov 2022 which she contracted in [place] so a different strain. She is vaccinated x 4. She has had shortness of breath since 2020 which has gradually worsened in the last several months associated with significant fatigue. Some coughing however not associated with dyspnea. No wheezing. Going grocery shopping can be taxing and activity is limited due to her endurance. Previously did water aerobics. She had recent cardiology evaluation demonstrating grade II diastolic dysfunction. Scheduled with Dr. Marlou Porch.  03/10/22 She reports her shortness of breath has improved without intervention. Occurs with moderate exertion. Denies wheezing and coughing. Since our last visit she is walking 2 miles three days a week. Drinking one protein shake a day. Seen by Cardiology in April and discussed diastolic dysfunction with plan to hold off on medication management since her dyspnea is not severe at this point.  Social History: Never smoker Second smoke exposure Wood burning stove x 2-3 years   Past Medical History:  Diagnosis Date   Anxiety    Arthritis    Breast cancer ([place]) 06/2019    left IMC   Cancer ([place])    basal cell carcinoma   GERD (gastroesophageal reflux disease)    History of radiation therapy 10/24/19- 11/18/19   Left Breast 15 fractions of 2.67 Gy to total 40.05 Gy. Left breast boost 5 fractions of 2 Gy to total 10 Gy   Personal history of radiation therapy 10/2019   Rectal bleed 09/2016     Family History  Problem Relation Age of Onset   Cancer Mother        breast cancer/endometrial cancer   Hypertension Mother    Breast cancer Mother    Hypertension Father    Cancer Father        Glioblastoma   Arthritis Father    Rheum arthritis Maternal Uncle    Colon cancer Maternal Uncle      Social History   Occupational History   Not on file  Tobacco Use   Smoking status: Never   Smokeless tobacco: Never  Vaping Use   Vaping Use: Never used  Substance and Sexual Activity   Alcohol use: Yes    Comment: social   Drug use: No   Sexual activity: Not on file    Allergies  Allergen Reactions   Amitriptyline Other (See Comments)    tremors   Sulfa Antibiotics Other (See Comments)    Doesn't remember, happened in childhood     Outpatient Medications Prior to Visit  Medication Sig Dispense Refill   acetaminophen (TYLENOL) 500 MG tablet Take 1,000 mg by mouth 2 (two) times daily as needed for mild pain.     albuterol (VENTOLIN HFA)  108 (90 Base) MCG/ACT inhaler INHALE 2 PUFFS BY MOUTH EVERY 6 HOURS AS NEEDED FOR WHEEZE OR SHORTNESS OF BREATH 18 each 6   ALPRAZolam (XANAX) 0.25 MG tablet Take 0.25 mg by mouth daily as needed for anxiety.     anastrozole (ARIMIDEX) 1 MG tablet Take 1 tablet (1 mg total) by mouth daily. 90 tablet 3   Calcium Polycarbophil (FIBER-CAPS PO) Take 2 capsules by mouth daily.     diclofenac (VOLTAREN) 50 MG EC tablet Take 50 mg by mouth 2 (two) times daily.     Diclofenac 35 MG CAPS 1 capsule as needed     fluticasone (FLONASE) 50 MCG/ACT nasal spray Place 2 sprays into both nostrils daily as needed for allergies or rhinitis.      latanoprost (XALATAN) 0.005 % ophthalmic solution Place 1 drop into both eyes at bedtime.     latanoprost (XALATAN) 0.005 % ophthalmic solution 1 drop into affected eye in the evening     loperamide (IMODIUM A-D) 2 MG tablet Take 2-4 mg by mouth 4 (four) times daily as needed for diarrhea or loose stools.     loratadine (CLARITIN) 10 MG tablet Take 10 mg by mouth daily.     Magnesium 250 MG TABS 1 tablet with a meal     nitroGLYCERIN (NITROSTAT) 0.4 MG SL tablet Place 1 tablet (0.4 mg total) under the tongue every 5 (five) minutes as needed for chest pain. Place 1 tablet (0.4 mg total) under the tongue every 5 (five) minutes up to 3 times as needed for chest pain. 25 tablet 4   pantoprazole (PROTONIX) 40 MG tablet Take by mouth. (Patient not taking: Reported on 01/23/2022)     rosuvastatin (CRESTOR) 10 MG tablet TAKE 1 TABLET BY MOUTH EVERY DAY 90 tablet 3   SYSTANE OVERNIGHT THERAPY 0.3 % GEL ophthalmic ointment SMARTSIG:1 Drop(s) In Eye(s) Every Evening     SYSTANE ULTRA 0.4-0.3 % SOLN SMARTSIG:1 Drop(s) In Eye(s) PRN     traZODone (DESYREL) 50 MG tablet Take 75 mg by mouth at bedtime.     metoprolol tartrate (LOPRESSOR) 100 MG tablet Take 1 tablet (100 mg total) by mouth once for 1 dose. Take 1 dose 2 hours prior to cardiac CTA! (Patient not taking: Reported on 01/23/2022) 1 tablet 0   omeprazole (PRILOSEC) 20 MG capsule Take 20 mg by mouth daily.     No facility-administered medications prior to visit.    Review of Systems  Constitutional:  Negative for chills, diaphoresis, fever, malaise/fatigue and weight loss.  HENT:  Negative for congestion.   Respiratory:  Positive for shortness of breath. Negative for cough, hemoptysis, sputum production and wheezing.   Cardiovascular:  Negative for chest pain, palpitations and leg swelling.    Objective:   Vitals:   03/10/22 1331  BP: 120/62  Pulse: 62  Temp: 97.9 F (36.6 C)  TempSrc: Oral  SpO2: 100%  Weight: 64.9 kg  Height: '5\' 4"'$   (1.626 m)     Physical Exam: General: Well-appearing, no acute distress HENT: [place], AT Eyes: EOMI, no scleral icterus Respiratory: Clear to auscultation bilaterally.  No crackles, wheezing or rales Cardiovascular: RRR, -M/R/G, no JVD Extremities:-Edema,-tenderness Neuro: AAO x4, CNII-XII grossly intact Psych: Normal mood, normal affect  Data Reviewed:  Imaging: CT coronary 11/28/2021-visualized lung fields with mild bibasilar atelectasis CXR 12/31/2021-no acute cardiopulmonary disease  PFT: 03/10/22 FVC 2.45 (86%) FEV1 2.27 (107%) Ratio 95  TLC 86% DLCO 104%. Partial bronchodilator response. Interpretation: Normal PFTs. No evidence of  obstructive or restrictive lung disease.    Labs: CBC    Component Value Date/Time   WBC 6.0 09/25/2021 1337   RBC 4.09 09/25/2021 1337   HGB 12.2 09/25/2021 1337   HGB 12.9 08/17/2019 0846   HCT 36.8 09/25/2021 1337   PLT 168 09/25/2021 1337   PLT 204 08/17/2019 0846   MCV 90.0 09/25/2021 1337   MCH 29.8 09/25/2021 1337   MCHC 33.2 09/25/2021 1337   RDW 12.3 09/25/2021 1337   LYMPHSABS 2.0 09/25/2021 1337   MONOABS 0.4 09/25/2021 1337   EOSABS 0.1 09/25/2021 1337   BASOSABS 0.0 09/25/2021 1337   Absolute eos 09/25/21 - 100     Assessment & Plan:   Discussion: 76 year old female never smoker with history of left breast cancer s/p lumpectomy in 2020, allergic rhinitis, HTN, GERD, osteoarthritis who presents for follow-up. Initially presented for shortness of breath and CXR commenting on emphysematous changes and pulmonary scarring. CXR reviewed with mild hyperinflation.  PFTs reviewed and normal with no evidence of obstructive or restrictive defect. No evidence of damage related to prior COVID infection. We discussed how her shortness of breath is likely due to deconditioning and encouraged regular aerobic activity and healthy diet to promote muscle building.  Shortness of breath likely secondary to deconditioning --Encourage regular  aerobic activity  Health Maintenance Immunization History  Administered Date(s) Administered   Influenza Split 07/27/2009, 08/25/2012, 07/27/2020   Influenza, High Dose Seasonal PF 09/20/2021   Influenza,inj,Quad PF,6+ Mos 08/25/2011   Influenza,inj,quad, With Preservative 06/20/2018, 07/21/2019   PFIZER(Purple Top)SARS-COV-2 Vaccination 11/28/2019, 12/23/2019, 07/27/2020   Pneumococcal Conjugate-13 09/04/2014   Pneumococcal Polysaccharide-23 08/25/2012   Tdap 01/16/2006, 05/06/2016   Zoster, Live 05/04/2012    No orders of the defined types were placed in this encounter.  No orders of the defined types were placed in this encounter.   Return if symptoms worsen or fail to improve.  I have spent a total time of 30-minutes on the day of the appointment including chart review, data review, collecting history, coordinating care and discussing medical diagnosis and plan with the patient/family. Past medical history, allergies, medications were reviewed. Pertinent imaging, labs and tests included in this note have been reviewed and interpreted independently by me.  [place], MD [facility] 03/10/2022 1:33 PM  Office Number [phone]

## 2022-03-25 DIAGNOSIS — H25813 Combined forms of age-related cataract, bilateral: Secondary | ICD-10-CM | POA: Diagnosis not present

## 2022-03-25 DIAGNOSIS — H401131 Primary open-angle glaucoma, bilateral, mild stage: Secondary | ICD-10-CM | POA: Diagnosis not present

## 2022-03-25 DIAGNOSIS — H35371 Puckering of macula, right eye: Secondary | ICD-10-CM | POA: Diagnosis not present

## 2022-04-16 DIAGNOSIS — K5730 Diverticulosis of large intestine without perforation or abscess without bleeding: Secondary | ICD-10-CM | POA: Diagnosis not present

## 2022-04-16 DIAGNOSIS — K2950 Unspecified chronic gastritis without bleeding: Secondary | ICD-10-CM | POA: Diagnosis not present

## 2022-04-16 DIAGNOSIS — K449 Diaphragmatic hernia without obstruction or gangrene: Secondary | ICD-10-CM | POA: Diagnosis not present

## 2022-04-16 DIAGNOSIS — K649 Unspecified hemorrhoids: Secondary | ICD-10-CM | POA: Diagnosis not present

## 2022-04-16 DIAGNOSIS — K317 Polyp of stomach and duodenum: Secondary | ICD-10-CM | POA: Diagnosis not present

## 2022-04-16 DIAGNOSIS — Z1211 Encounter for screening for malignant neoplasm of colon: Secondary | ICD-10-CM | POA: Diagnosis not present

## 2022-04-16 DIAGNOSIS — Z8371 Family history of colonic polyps: Secondary | ICD-10-CM | POA: Diagnosis not present

## 2022-04-16 DIAGNOSIS — K219 Gastro-esophageal reflux disease without esophagitis: Secondary | ICD-10-CM | POA: Diagnosis not present

## 2022-05-21 ENCOUNTER — Other Ambulatory Visit: Payer: Self-pay | Admitting: *Deleted

## 2022-05-21 NOTE — Patient Outreach (Signed)
  Care Coordination   05/21/2022 Name: Susan Randolph MRN: [id number] DOB: October 17, 1946   Care Coordination Outreach Attempts:  An unsuccessful telephone outreach was attempted today to offer the patient information about available care coordination services as a benefit of their health plan.   Follow Up Plan:  Additional outreach attempts will be made to offer the patient care coordination information and services.   Encounter Outcome:  No Answer  Care Coordination Interventions Activated:  Yes   Care Coordination Interventions:  No, not indicated    [facility] [phone]

## 2022-06-16 DIAGNOSIS — H35371 Puckering of macula, right eye: Secondary | ICD-10-CM | POA: Diagnosis not present

## 2022-06-16 DIAGNOSIS — H401131 Primary open-angle glaucoma, bilateral, mild stage: Secondary | ICD-10-CM | POA: Diagnosis not present

## 2022-06-16 DIAGNOSIS — H25811 Combined forms of age-related cataract, right eye: Secondary | ICD-10-CM | POA: Diagnosis not present

## 2022-06-25 DIAGNOSIS — H9202 Otalgia, left ear: Secondary | ICD-10-CM | POA: Diagnosis not present

## 2022-07-14 DIAGNOSIS — U071 COVID-19: Secondary | ICD-10-CM | POA: Diagnosis not present

## 2022-07-29 DIAGNOSIS — Z789 Other specified health status: Secondary | ICD-10-CM | POA: Diagnosis not present

## 2022-07-29 DIAGNOSIS — B029 Zoster without complications: Secondary | ICD-10-CM | POA: Diagnosis not present

## 2022-07-29 DIAGNOSIS — Z85828 Personal history of other malignant neoplasm of skin: Secondary | ICD-10-CM | POA: Diagnosis not present

## 2022-07-29 DIAGNOSIS — L821 Other seborrheic keratosis: Secondary | ICD-10-CM | POA: Diagnosis not present

## 2022-07-29 DIAGNOSIS — Z08 Encounter for follow-up examination after completed treatment for malignant neoplasm: Secondary | ICD-10-CM | POA: Diagnosis not present

## 2022-07-29 DIAGNOSIS — L814 Other melanin hyperpigmentation: Secondary | ICD-10-CM | POA: Diagnosis not present

## 2022-07-29 DIAGNOSIS — L820 Inflamed seborrheic keratosis: Secondary | ICD-10-CM | POA: Diagnosis not present

## 2022-07-29 DIAGNOSIS — R208 Other disturbances of skin sensation: Secondary | ICD-10-CM | POA: Diagnosis not present

## 2022-07-29 DIAGNOSIS — D1801 Hemangioma of skin and subcutaneous tissue: Secondary | ICD-10-CM | POA: Diagnosis not present

## 2022-08-07 ENCOUNTER — Other Ambulatory Visit: Payer: Self-pay | Admitting: Internal Medicine

## 2022-08-07 DIAGNOSIS — Z853 Personal history of malignant neoplasm of breast: Secondary | ICD-10-CM

## 2022-08-25 ENCOUNTER — Telehealth: Payer: Self-pay | Admitting: Hematology and Oncology

## 2022-08-25 NOTE — Telephone Encounter (Signed)
Called patient to reschedule December appointment. Patient notified.

## 2022-08-27 NOTE — Progress Notes (Signed)
Cardiology Office Note:    Date:  09/02/2022   ID:  NIYAH MAMARIL, DOB 1946/07/02, MRN [id number]  PCP:  Josetta Huddle, MD   [facility] Providers Cardiologist:  Candee Furbish, MD     Referring MD: Josetta Huddle, MD   No chief complaint on file. Chest pain  History of Present Illness:    Susan Randolph is a 76 y.o. female with a hx of HTN, aortic atherosclerosis by CT, estrogen receptor positive breast cancer, nonobstructive CAD, and hyperlipidemia.   She was referred to cardiology by PCP for hypertension, review of cardiac prevention and was evaluated by Dr. Marlou Porch on 09/26/2020.  A CT scan of her abdomen and pelvis in October 2018 was personally reviewed by Dr. Marlou Porch and reported to demonstrate evidence of descending aortic atherosclerosis. Due to this finding and an elevated LDL cholesterol, she was started on Crestor 10 mg.  She reported taking antihypertensives in the past which were eventually stopped for blood pressure in the 90s.  Her EKG revealed on his bradycardia at 55 bpm with no other abnormalities noted.  She was encouraged to follow-up in 1 year.  I saw her on 11/15/21 for evaluation of  recent chest discomfort, described as "aching." First occurred December 2022. One episode occurred while sitting in church, resolved before church ended, lasted approximately 20 minutes. Another episode occurred while preparing for Christmas lunch while busy working in the kitchen. This episode also resolved on its own. States there are other times that she just feels aching in her chest. Reported increased dyspnea with exertion on occasion. Able to participate in water aerobics 3 x per week without difficulty. She denied lower extremity edema, fatigue, palpitations, melena, hematuria, hemoptysis, diaphoresis, weakness, presyncope, syncope, orthopnea, and PND. Had Covid infection x 3, with the most recent occurring 08/20/21, took molnupiravir and did not experience significant illness. Completed  radiation for breast cancer in January 2021. 2D echo on 11/22/21 revealed normal LVEF 65 to 03%, grade 2 diastolic dysfunction, normal RV, moderately dilated LA, small pericardial effusion, no significant valve disease. Coronary CTA 11/28/21 revealed coronary calcium score of 41.9, 50th percentile for age/sex, mild stenosis in RCA and LAD, normal coronary origin with right dominance.   Seen by Dr. Marlou Porch 01/23/22 for follow-up.  She had recent palpitations and syncope and was admitted to the hospital, she also aspirated. At first thought it was food poisoning.  Had extensive work-up at [facility] which was unremarkable. Addition of HCTZ was discussed for diastolic dysfunction, however she decided to hold off.  31-monthfollow-up was recommended.  Today, she is here alone for follow-up. Reassured by pulmonology visit 03/10/22.  PFTs were normal with no evidence of obstructive or restrictive defect. Shortness of breath felt to be 2/2 deconditioning and regular aerobic activity was encouraged. She continues to feel fatigued. Sleeps approximately 10 hours nightly and also naps most days. Has not been monitoring BP at home. She denies chest pain, shortness of breath, lower extremity edema, palpitations, melena, hematuria, hemoptysis, diaphoresis, weakness, presyncope, syncope, orthopnea, and PND. Husband walks 4 miles 6 days per week. I have encouraged her to join him.   Past Medical History:  Diagnosis Date   Anxiety    Arthritis    Breast cancer ([facility] 06/2019   left IMC   Cancer ([place]    basal cell carcinoma   GERD (gastroesophageal reflux disease)    History of radiation therapy 10/24/19- 11/18/19   Left Breast 15 fractions of 2.67 Gy to total 40.05  Gy. Left breast boost 5 fractions of 2 Gy to total 10 Gy   Personal history of radiation therapy 10/2019   Rectal bleed 09/2016    Past Surgical History:  Procedure Laterality Date   BREAST BIOPSY Left 08/2019   BREAST LUMPECTOMY Left 2020   BREAST LUMPECTOMY WITH  RADIOACTIVE SEED AND SENTINEL LYMPH NODE BIOPSY Left 08/31/2019   Procedure: LEFT BREAST LUMPECTOMY WITH RADIOACTIVE SEED AND LEFT AXILLARY SENTINEL LYMPH NODE BIOPSY;  Surgeon: Alphonsa Overall, MD;  Location: [facility];  Service: General;  Laterality: Left;   EYE SURGERY     Corrected drooping eye lids bilateral   MENISCUS REPAIR Right 03/29/2019   PARTIAL KNEE ARTHROPLASTY Right 10/31/2020   Procedure: Right knee medial unicompartmental arthroplasty;  Surgeon: Gaynelle Arabian, MD;  Location: [facility];  Service: Orthopedics;  Laterality: Right;  48mn   POSTERIOR CERVICAL FUSION/FORAMINOTOMY N/A 01/11/2018   Procedure: Posterior Cervical Fusion with lateral mass fixation - Cervical one-Cervical two with Iliac Crest bone graft;  Surgeon: PEarnie Larsson MD;  Location[facility]  Service: Neurosurgery;  Laterality: N/A;   TUBAL LIGATION      Current Medications: Current Meds  Medication Sig   acetaminophen (TYLENOL) 500 MG tablet Take 1,000 mg by mouth 2 (two) times daily as needed for mild pain.   albuterol (VENTOLIN HFA) 108 (90 Base) MCG/ACT inhaler INHALE 2 PUFFS BY MOUTH EVERY 6 HOURS AS NEEDED FOR WHEEZE OR SHORTNESS OF BREATH   ALPRAZolam (XANAX) 0.25 MG tablet Take 0.25 mg by mouth daily as needed for anxiety.   anastrozole (ARIMIDEX) 1 MG tablet Take 1 tablet (1 mg total) by mouth daily.   Calcium Polycarbophil (FIBER-CAPS PO) Take 2 capsules by mouth daily.   diclofenac (VOLTAREN) 50 MG EC tablet Take 50 mg by mouth daily at 6 (six) AM.   Diclofenac 35 MG CAPS 1 capsule as needed   latanoprost (XALATAN) 0.005 % ophthalmic solution Place 1 drop into both eyes at bedtime.   loperamide (IMODIUM A-D) 2 MG tablet Take 2-4 mg by mouth 4 (four) times daily as needed for diarrhea or loose stools.   loratadine (CLARITIN) 10 MG tablet Take 10 mg by mouth daily.   Magnesium 250 MG TABS 3 (three) times a week.   nitroGLYCERIN (NITROSTAT) 0.4 MG SL tablet Place 1 tablet (0.4 mg total)  under the tongue every 5 (five) minutes as needed for chest pain. Place 1 tablet (0.4 mg total) under the tongue every 5 (five) minutes up to 3 times as needed for chest pain.   pantoprazole (PROTONIX) 40 MG tablet Take by mouth.   Prednisol Ace-Moxiflox-Bromfen 1-0.5-0.075 % SUSP 4 (four) times daily.   rosuvastatin (CRESTOR) 10 MG tablet TAKE 1 TABLET BY MOUTH EVERY DAY   SYSTANE OVERNIGHT THERAPY 0.3 % GEL ophthalmic ointment as needed for dry eyes.   SYSTANE ULTRA 0.4-0.3 % SOLN as needed (dry eyes).   traZODone (DESYREL) 50 MG tablet Take 25 mg by mouth at bedtime.     Allergies:   Amitriptyline and Sulfa antibiotics   Social History   Socioeconomic History   Marital status: Married    Spouse name: Not on file   Number of children: Not on file   Years of education: Not on file   Highest education level: Not on file  Occupational History   Not on file  Tobacco Use   Smoking status: Never   Smokeless tobacco: Never  Vaping Use   Vaping Use: Never used  Substance  and Sexual Activity   Alcohol use: Yes    Comment: social   Drug use: No   Sexual activity: Not on file  Other Topics Concern   Not on file  Social History Narrative   Not on file   Social Determinants of Health   Financial Resource Strain: Not on file  Food Insecurity: Not on file  Transportation Needs: No Transportation Needs (09/21/2019)   PRAPARE - Hydrologist (Medical): No    Lack of Transportation (Non-Medical): No  Physical Activity: Not on file  Stress: Not on file  Social Connections: Not on file     Family History: The patient's family history includes Arthritis in her father; Breast cancer in her mother; Cancer in her father and mother; Colon cancer in her maternal uncle; Hypertension in her father and mother; Rheum arthritis in her maternal uncle.  ROS:   Please see the history of present illness. All other systems reviewed and are negative.  Labs/Other Studies  Reviewed:    The following studies were reviewed today:  CCTA 11/28/21  IMPRESSION: 1. Minimal mixed non-obstructive CAD, CADRADS = 1.   2. Coronary calcium score of 41.9. This was 50th percentile for age and sex matched control.   3. Normal coronary origin with right dominance.   4. Consider non-coronary causes of chest pain.   Echo 11/22/21  1. Left ventricular ejection fraction, by estimation, is 65 to 70%. The  left ventricle has normal function. The left ventricle has no regional  wall motion abnormalities. Left ventricular diastolic parameters are  consistent with Grade II diastolic  dysfunction (pseudonormalization).   2. Right ventricular systolic function is normal. The right ventricular  size is normal. There is normal pulmonary artery systolic pressure.   3. Left atrial size was moderately dilated.   4. Right atrial size was mildly dilated.   5. A small pericardial effusion is present. There is no evidence of  cardiac tamponade.   6. The mitral valve is normal in structure. Trivial mitral valve  regurgitation. No evidence of mitral stenosis.   7. The aortic valve is tricuspid. Aortic valve regurgitation is not  visualized. No aortic stenosis is present.   8. The inferior vena cava is normal in size with greater than 50%  respiratory variability, suggesting right atrial pressure of 3 mmHg.   Comparison(s): No prior Echocardiogram.    CT abdomen 10/18   IMPRESSION: 1.  No acute process or explanation for hematuria. 2.  Aortic Atherosclerosis (ICD10-I70.0). 3. Prominent gonadal veins, as can be seen with pelvic congestion syndrome.    Recent Labs: 09/25/2021: ALT 20; Hemoglobin 12.2; Platelets 168 11/15/2021: BUN 24; Creatinine, Ser 0.71; Potassium 4.7; Sodium 139  Recent Lipid Panel    Component Value Date/Time   CHOL 135 12/26/2020 0842   TRIG 125 12/26/2020 0842   HDL 50 12/26/2020 0842   CHOLHDL 2.7 12/26/2020 0842   LDLCALC 63 12/26/2020 0842      Risk Assessment/Calculations:      Physical Exam:    VS:  BP (!) 142/78   Pulse 63   Ht '5\' 4"'$  (1.626 m)   Wt 146 lb (66.2 kg)   SpO2 99%   BMI 25.06 kg/m     Wt Readings from Last 3 Encounters:  09/02/22 146 lb (66.2 kg)  03/10/22 143 lb (64.9 kg)  01/23/22 141 lb 9.6 oz (64.2 kg)     GEN:  Well nourished, well developed in no acute distress HEENT:  Normal NECK: No JVD; No carotid bruits CARDIAC: RRR, no murmurs, rubs, gallops RESPIRATORY:  Clear to auscultation without rales, wheezing or rhonchi  ABDOMEN: Soft, non-tender, non-distended MUSCULOSKELETAL:  No edema; No deformity. 2+ pedal pulses, equal bilaterally SKIN: Warm and dry NEUROLOGIC:  Alert and oriented x 3 PSYCHIATRIC:  Normal affect   EKG:  EKG is ordered today.  The ekg ordered today demonstrates NSR at rate of 70 bpm, no ST/T wave abnormality  Diagnoses:    1. Coronary artery disease involving native coronary artery of native heart without angina pectoris   2. Fatigue, unspecified type   3. Hyperlipidemia LDL goal <70   4. Chronic heart failure with preserved ejection fraction (HFpEF) ([place])   5. Essential hypertension     Assessment and Plan:     CAD:  Mild nonobstructive CAD by CCTA 11/2021. We reviewed these results in detail. She denies chest pain, dyspnea, or other symptoms concerning for angina. No indication for further ischemic evaluation at this time. Encouraged 150 minutes of moderate intensity exercise each week. Should walk 1/2 mile in 30 minutes or less. Encouraged heart healthy, mostly plant based diet, avoiding high sugar and processed foods.   Chronic HFpEF: LVEF 65 to 43%, grade 2 diastolic dysfunction on echo 11/2021.  Discussed the finding of diastolic dysfunction and its indications. Appears euvolemic on exam. She will work on increasing activity. BP elevated today. If it remains elevated will initiate GDMT for diastolic heart failure with ACEi/ARB/ARNI. She will call back to report  BP readings over next 2 weeks.   Fatigue: Has been ongoing for several months at least. Maybe prior to COVID infection last fall. Encouraged increased physical activity and improved diet as noted above. No clear explanation for fatigue from recent lab work. Has routine follow-up with PCP in January.   Hyperlipidemia LDL goal < 70: LDL 49 on 12/31/21. She understands indication for goal < 70 in setting of CAD on coronary CT. Will recheck fasting lipids at next appointment if not done by PCP.   Essential hypertension: BP is elevated today. She has not been monitoring at home recently. Advised her to monitor consistently for 2 weeks and send those readings to [place].  She previously took lisinopril for hypertension. Would consider restarting low dose lisinopril or an ARB if BP remains elevated.  Disposition: 6 months with Dr. Marlou Porch or me

## 2022-08-28 DIAGNOSIS — H2511 Age-related nuclear cataract, right eye: Secondary | ICD-10-CM | POA: Diagnosis not present

## 2022-08-28 DIAGNOSIS — H401111 Primary open-angle glaucoma, right eye, mild stage: Secondary | ICD-10-CM | POA: Diagnosis not present

## 2022-09-02 ENCOUNTER — Encounter: Payer: Self-pay | Admitting: Nurse Practitioner

## 2022-09-02 ENCOUNTER — Ambulatory Visit: Payer: Medicare PPO | Attending: Nurse Practitioner | Admitting: Nurse Practitioner

## 2022-09-02 VITALS — BP 142/78 | HR 63 | Ht 64.0 in | Wt 146.0 lb

## 2022-09-02 DIAGNOSIS — I10 Essential (primary) hypertension: Secondary | ICD-10-CM | POA: Diagnosis not present

## 2022-09-02 DIAGNOSIS — I2510 Atherosclerotic heart disease of native coronary artery without angina pectoris: Secondary | ICD-10-CM

## 2022-09-02 DIAGNOSIS — I5032 Chronic diastolic (congestive) heart failure: Secondary | ICD-10-CM

## 2022-09-02 DIAGNOSIS — R5383 Other fatigue: Secondary | ICD-10-CM | POA: Diagnosis not present

## 2022-09-02 DIAGNOSIS — E785 Hyperlipidemia, unspecified: Secondary | ICD-10-CM | POA: Diagnosis not present

## 2022-09-02 NOTE — Patient Instructions (Signed)
Medication Instructions:   Your physician recommends that you continue on your current medications as directed. Please refer to the Current Medication list given to you today.   *If you need a refill on your cardiac medications before your next appointment, please call your pharmacy*   Lab Work:  None ordered.  If you have labs (blood work) drawn today and your tests are completely normal, you will receive your results only by: [place] (if you have MyChart) OR A paper copy in the mail If you have any lab test that is abnormal or we need to change your treatment, we will call you to review the results.   Testing/Procedures:  None ordered.   Follow-Up: At [facility], you and your health needs are our priority.  As part of our continuing mission to provide you with exceptional heart care, we have created designated Provider Care Teams.  These Care Teams include your primary Cardiologist (physician) and Advanced Practice Providers (APPs -  Physician Assistants and Nurse Practitioners) who all work together to provide you with the care you need, when you need it.  We recommend signing up for the patient portal called "MyChart".  Sign up information is provided on this After Visit Summary.  MyChart is used to connect with patients for Virtual Visits (Telemedicine).  Patients are able to view lab/test results, encounter notes, upcoming appointments, etc.  Non-urgent messages can be sent to your provider as well.   To learn more about what you can do with MyChart, go to [email or web address].    Your next appointment:   5 month(s)  The format for your next appointment:   In Person  Provider:   Christen Bame, NP         Other Instructions  [place] refers to food and lifestyle choices that are based on the traditions of countries located on the [place]. It focuses on eating more fruits, vegetables, whole grains, beans,  nuts, seeds, and heart-healthy fats, and eating less dairy, meat, eggs, and processed foods with added sugar, salt, and fat. This way of eating has been shown to help prevent certain conditions and improve outcomes for people who have chronic diseases, like kidney disease and heart disease. What are tips for following this plan? Reading food labels Check the serving size of packaged foods. For foods such as rice and pasta, the serving size refers to the amount of cooked product, not dry. Check the total fat in packaged foods. Avoid foods that have saturated fat or trans fats. Check the ingredient list for added sugars, such as corn syrup. Shopping  Buy a variety of foods that offer a balanced diet, including: Fresh fruits and vegetables (produce). Grains, beans, nuts, and seeds. Some of these may be available in unpackaged forms or large amounts (in bulk). Fresh seafood. Poultry and eggs. Low-fat dairy products. Buy whole ingredients instead of prepackaged foods. Buy fresh fruits and vegetables in-season from local farmers markets. Buy plain frozen fruits and vegetables. If you do not have access to quality fresh seafood, buy precooked frozen shrimp or canned fish, such as tuna, salmon, or sardines. Stock your pantry so you always have certain foods on hand, such as olive oil, canned tuna, canned tomatoes, rice, pasta, and beans. Cooking Cook foods with extra-virgin olive oil instead of using butter or other vegetable oils. Have meat as a side dish, and have vegetables or grains as your main dish. This means having meat in small portions or  adding small amounts of meat to foods like pasta or stew. Use beans or vegetables instead of meat in common dishes like chili or lasagna. Experiment with different cooking methods. Try roasting, broiling, steaming, and sauting vegetables. Add frozen vegetables to soups, stews, pasta, or rice. Add nuts or seeds for added healthy fats and plant protein at  each meal. You can add these to yogurt, salads, or vegetable dishes. Marinate fish or vegetables using olive oil, lemon juice, garlic, and fresh herbs. Meal planning Plan to eat one vegetarian meal one day each week. Try to work up to two vegetarian meals, if possible. Eat seafood two or more times a week. Have healthy snacks readily available, such as: Vegetable sticks with hummus. Greek yogurt. Fruit and nut trail mix. Eat balanced meals throughout the week. This includes: Fruit: 2-3 servings a day. Vegetables: 4-5 servings a day. Low-fat dairy: 2 servings a day. Fish, poultry, or lean meat: 1 serving a day. Beans and legumes: 2 or more servings a week. Nuts and seeds: 1-2 servings a day. Whole grains: 6-8 servings a day. Extra-virgin olive oil: 3-4 servings a day. Limit red meat and sweets to only a few servings a month. Lifestyle  Cook and eat meals together with your family, when possible. Drink enough fluid to keep your urine pale yellow. Be physically active every day. This includes: Aerobic exercise like running or swimming. Leisure activities like gardening, walking, or housework. Get 7-8 hours of sleep each night. If recommended by your health care provider, drink red wine in moderation. This means 1 glass a day for nonpregnant women and 2 glasses a day for men. A glass of wine equals 5 oz (150 mL). What foods should I eat? Fruits Apples. Apricots. Avocado. Berries. Bananas. Cherries. Dates. Figs. Grapes. Lemons. Melon. Oranges. Peaches. Plums. Pomegranate. Vegetables Artichokes. Beets. Broccoli. Cabbage. Carrots. Eggplant. Green beans. Chard. Kale. Spinach. Onions. Leeks. Peas. Squash. Tomatoes. Peppers. Radishes. Grains Whole-grain pasta. Brown rice. Bulgur wheat. Polenta. Couscous. Whole-wheat bread. [place]. Meats and other proteins Beans. Almonds. Sunflower seeds. Pine nuts. Peanuts. [place]. Salmon. Scallops. Shrimp. [place]. Tilapia. Clams. Oysters. Eggs. Poultry  without skin. Dairy Low-fat milk. Cheese. Greek yogurt. Fats and oils Extra-virgin olive oil. Avocado oil. Grapeseed oil. Beverages Water. Red wine. Herbal tea. Sweets and desserts Greek yogurt with honey. Baked apples. Poached pears. Trail mix. Seasonings and condiments Basil. Cilantro. Coriander. Cumin. Mint. Parsley. Sage. Rosemary. Tarragon. Garlic. Oregano. Thyme. Pepper. Balsamic vinegar. Tahini. Hummus. Tomato sauce. Olives. Mushrooms. The items listed above may not be a complete list of foods and beverages you can eat. Contact a dietitian for more information. What foods should I limit? This is a list of foods that should be eaten rarely or only on special occasions. Fruits Fruit canned in syrup. Vegetables Deep-fried potatoes (french fries). Grains Prepackaged pasta or rice dishes. Prepackaged cereal with added sugar. Prepackaged snacks with added sugar. Meats and other proteins Beef. Pork. Lamb. Poultry with skin. Hot dogs. [place] Salines. Dairy Ice cream. Sour cream. Whole milk. Fats and oils Butter. Canola oil. Vegetable oil. Beef fat (tallow). Lard. Beverages Juice. Sugar-sweetened soft drinks. Beer. Liquor and spirits. Sweets and desserts Cookies. Cakes. Pies. Candy. Seasonings and condiments Mayonnaise. Pre-made sauces and marinades. The items listed above may not be a complete list of foods and beverages you should limit. Contact a dietitian for more information. Summary The Mediterranean diet includes both food and lifestyle choices. Eat a variety of fresh fruits and vegetables, beans, nuts, seeds, and whole grains.  Limit the amount of red meat and sweets that you eat. If recommended by your health care provider, drink red wine in moderation. This means 1 glass a day for nonpregnant women and 2 glasses a day for men. A glass of wine equals 5 oz (150 mL). This information is not intended to replace advice given to you by your health care provider. Make sure you discuss  any questions you have with your health care provider. Document Revised: 11/11/2019 Document Reviewed: 09/08/2019 Elsevier Patient Education  [place]. Adopting a Healthy Lifestyle.   Weight: Know what a healthy weight is for you (roughly BMI <25) and aim to maintain this. You can calculate your body mass index on your smart phone  Diet: Aim for 7+ servings of fruits and vegetables daily Limit animal fats in diet for cholesterol and heart health - choose grass fed whenever available Avoid highly processed foods (fast food burgers, tacos, fried chicken, pizza, hot dogs, french fries)  Saturated fat comes in the form of butter, lard, coconut oil, margarine, partially hydrogenated oils, and fat in meat. These increase your risk of cardiovascular disease.  Use healthy plant oils, such as olive, canola, soy, corn, sunflower and peanut.  Whole foods such as fruits, vegetables and whole grains have fiber  Men need > 38 grams of fiber per day Women need > 25 grams of fiber per day  Load up on vegetables and fruits - one-half of your plate: Aim for color and variety, and remember that potatoes dont count. Go for whole grains - one-quarter of your plate: Whole wheat, barley, wheat berries, quinoa, oats, brown rice, and foods made with them. If you want pasta, go with whole wheat pasta. Protein power - one-quarter of your plate: Fish, chicken, beans, and nuts are all healthy, versatile protein sources. Limit red meat. You need carbohydrates for energy! The type of carbohydrate is more important than the amount. Choose carbohydrates such as vegetables, fruits, whole grains, beans, and nuts in the place of white rice, white pasta, potatoes (baked or fried), macaroni and cheese, cakes, cookies, and donuts.  If youre thirsty, drink water. Coffee and tea are good in moderation, but skip sugary drinks and limit milk and dairy products to one or two daily servings. Keep sugar intake at 6 teaspoons or  24 grams or LESS       Exercise: Aim for 150 min of moderate intensity exercise weekly for heart health, and weights twice weekly for bone health Stay active - any steps are better than no steps! Aim for 7-9 hours of sleep daily      HOW TO TAKE YOUR BLOOD PRESSURE  Rest 5 minutes before taking your blood pressure. Don't  smoke or drink caffeinated beverages for at least 30 minutes before. Take your blood pressure before (not after) you eat. Sit comfortably with your back supported and both feet on the floor ( don't cross your legs). Elevate your arm to heart level on a table or a desk. Use the proper sized cuff.  It should fit smoothly and snugly around your bare upper arm.  There should be  Enough room to slip a fingertip under the cuff.  The bottom edge of the cuff should be 1 inch above the crease Of the elbow. Please monitor your blood pressure once daily 2 hours after your am medication. If you blood pressure Consistently remains above 093 (systolic) top number or over 80 ( diastolic) bottom number X 3 days  Consecutively.  Please call our office at [phone] or send Mychart message.     Important Information About Sugar

## 2022-09-15 DIAGNOSIS — H2512 Age-related nuclear cataract, left eye: Secondary | ICD-10-CM | POA: Diagnosis not present

## 2022-09-18 DIAGNOSIS — H401121 Primary open-angle glaucoma, left eye, mild stage: Secondary | ICD-10-CM | POA: Diagnosis not present

## 2022-09-18 DIAGNOSIS — H2512 Age-related nuclear cataract, left eye: Secondary | ICD-10-CM | POA: Diagnosis not present

## 2022-09-23 ENCOUNTER — Ambulatory Visit
Admission: RE | Admit: 2022-09-23 | Discharge: 2022-09-23 | Disposition: A | Discharge status: Home / Self Care | Payer: Medicare PPO | Source: Ambulatory Visit | Attending: Internal Medicine | Admitting: Internal Medicine

## 2022-09-23 DIAGNOSIS — R92333 Mammographic heterogeneous density, bilateral breasts: Secondary | ICD-10-CM | POA: Diagnosis not present

## 2022-09-23 DIAGNOSIS — Z853 Personal history of malignant neoplasm of breast: Secondary | ICD-10-CM | POA: Diagnosis not present

## 2022-09-25 ENCOUNTER — Ambulatory Visit: Payer: Medicare PPO | Admitting: Hematology and Oncology

## 2022-09-25 ENCOUNTER — Other Ambulatory Visit: Payer: Medicare PPO

## 2022-09-30 ENCOUNTER — Other Ambulatory Visit: Payer: Medicare PPO

## 2022-09-30 ENCOUNTER — Ambulatory Visit: Payer: Medicare PPO | Admitting: Hematology and Oncology

## 2022-10-01 ENCOUNTER — Other Ambulatory Visit: Payer: Medicare PPO

## 2022-10-01 ENCOUNTER — Ambulatory Visit: Payer: Medicare PPO | Admitting: Hematology and Oncology

## 2022-10-06 ENCOUNTER — Ambulatory Visit: Payer: Medicare PPO | Admitting: Hematology and Oncology

## 2022-10-06 ENCOUNTER — Other Ambulatory Visit: Payer: Medicare PPO

## 2022-10-23 ENCOUNTER — Telehealth: Payer: Self-pay

## 2022-10-23 NOTE — Patient Instructions (Signed)
Visit Information  Thank you for taking time to visit with me today. Please don't hesitate to contact me if I can be of assistance to you.   Following are the goals we discussed today:   Goals Addressed             This Visit's Progress    Care Coordination Activities-self management of HTN       Care Coordination Interventions: Evaluation of current treatment plan related to hypertension self management and patient's adherence to plan as established by provider Counseled on the importance of exercise goals with target of 150 minutes per week Discussed plans with patient for ongoing care management follow up and provided patient with direct contact information for care management team Advised patient, providing education and rationale, to monitor blood pressure daily and record, calling PCP for findings outside established parameters Provided education on prescribed diet low sodium Assessed social determinant of health barriers Reviewed importance of trying to not salt food before tasting Reviewed getting Shingrix shots           Our next appointment is by telephone on 12/29/22 at 11 AM  Please call the care guide team at [phone] if you need to cancel or reschedule your appointment.   If you are experiencing a Mental Health or [place] or need someone to talk to, please call the Suicide and Crisis Lifeline: 988 call the [place] National Suicide Prevention Lifeline: 604-684-8097 or TTY: (320) 570-0285 TTY (734)521-2697) to talk to a trained counselor call 1-800-273-TALK (toll free, 24 hour hotline) go to [facility] [address], [place] [phone]) call 911   Patient verbalizes understanding of instructions and care plan provided today and agrees to view in [place]. Active MyChart status and patient understanding of how to access instructions and care plan via MyChart confirmed with patient.     Telephone follow up  appointment with care management team member scheduled for: 12/29/22  Peter Garter RN, Jackquline [place], [place] [facility] [phone]

## 2022-10-23 NOTE — Patient Outreach (Signed)
  Care Coordination   Initial Visit Note   10/23/2022 Name: ELTON CATALANO MRN: [id number] DOB: Dec 20, 1945  Clementeen Hoof Forcier is a 77 y.o. year old female who sees Josetta Huddle, MD for primary care. I spoke with  Clementeen Hoof Hakimi by phone today.  What matters to the patients health and wellness today?  I am doing good and I am trying to not to go back on B/P medications. States she is not regular with checking her B/P in the last week or so but her readings have been below 140/80 most of the time.  States she walks sometimes for exercise when the weather allows.  States she does like to salt her food before tasting.    Goals Addressed             This Visit's Progress    Care Coordination Activities-self management of HTN       Care Coordination Interventions: Evaluation of current treatment plan related to hypertension self management and patient's adherence to plan as established by provider Counseled on the importance of exercise goals with target of 150 minutes per week Discussed plans with patient for ongoing care management follow up and provided patient with direct contact information for care management team Advised patient, providing education and rationale, to monitor blood pressure daily and record, calling PCP for findings outside established parameters Provided education on prescribed diet low sodium Assessed social determinant of health barriers Reviewed importance of trying to not salt food before tasting Reviewed getting Shingrix shots           SDOH assessments and interventions completed:  Yes  SDOH Interventions Today    Flowsheet Row Most Recent Value  SDOH Interventions   Food Insecurity Interventions Intervention Not Indicated  Housing Interventions Intervention Not Indicated  Transportation Interventions Intervention Not Indicated  Utilities Interventions Intervention Not Indicated        Care Coordination Interventions:  Yes, provided   Follow up  plan: Follow up call scheduled for 12/29/22    Encounter Outcome:  Pt. Visit Completed  Peter Garter RN, [facility], [place] [facility] [phone]

## 2022-10-27 ENCOUNTER — Other Ambulatory Visit: Payer: Self-pay | Admitting: *Deleted

## 2022-10-27 DIAGNOSIS — Z170 Estrogen receptor positive status [ER+]: Secondary | ICD-10-CM

## 2022-10-28 ENCOUNTER — Other Ambulatory Visit: Payer: Medicare PPO

## 2022-10-28 ENCOUNTER — Ambulatory Visit: Payer: Medicare PPO | Admitting: Hematology and Oncology

## 2022-11-05 ENCOUNTER — Other Ambulatory Visit: Payer: Self-pay

## 2022-11-05 ENCOUNTER — Inpatient Hospital Stay: Payer: Medicare PPO | Attending: Hematology and Oncology

## 2022-11-05 ENCOUNTER — Inpatient Hospital Stay: Payer: Medicare PPO | Admitting: Hematology and Oncology

## 2022-11-05 VITALS — BP 144/87 | HR 62 | Temp 98.6°F | Resp 16 | Ht 64.0 in | Wt 146.8 lb

## 2022-11-05 DIAGNOSIS — Z79899 Other long term (current) drug therapy: Secondary | ICD-10-CM | POA: Diagnosis not present

## 2022-11-05 DIAGNOSIS — Z170 Estrogen receptor positive status [ER+]: Secondary | ICD-10-CM | POA: Diagnosis not present

## 2022-11-05 DIAGNOSIS — Z923 Personal history of irradiation: Secondary | ICD-10-CM | POA: Diagnosis not present

## 2022-11-05 DIAGNOSIS — C50412 Malignant neoplasm of upper-outer quadrant of left female breast: Secondary | ICD-10-CM

## 2022-11-05 DIAGNOSIS — M8580 Other specified disorders of bone density and structure, unspecified site: Secondary | ICD-10-CM | POA: Insufficient documentation

## 2022-11-05 DIAGNOSIS — Z79811 Long term (current) use of aromatase inhibitors: Secondary | ICD-10-CM | POA: Insufficient documentation

## 2022-11-05 LAB — CBC WITH DIFFERENTIAL (CANCER CENTER ONLY)
Abs Immature Granulocytes: 0.01 10*3/uL (ref 0.00–0.07)
Basophils Absolute: 0 10*3/uL (ref 0.0–0.1)
Basophils Relative: 1 %
Eosinophils Absolute: 0.1 10*3/uL (ref 0.0–0.5)
Eosinophils Relative: 1 %
HCT: 37 % (ref 36.0–46.0)
Hemoglobin: 12.6 g/dL (ref 12.0–15.0)
Immature Granulocytes: 0 %
Lymphocytes Relative: 34 %
Lymphs Abs: 1.7 10*3/uL (ref 0.7–4.0)
MCH: 30.6 pg (ref 26.0–34.0)
MCHC: 34.1 g/dL (ref 30.0–36.0)
MCV: 89.8 fL (ref 80.0–100.0)
Monocytes Absolute: 0.3 10*3/uL (ref 0.1–1.0)
Monocytes Relative: 6 %
Neutro Abs: 2.9 10*3/uL (ref 1.7–7.7)
Neutrophils Relative %: 58 %
Platelet Count: 168 10*3/uL (ref 150–400)
RBC: 4.12 MIL/uL (ref 3.87–5.11)
RDW: 11.9 % (ref 11.5–15.5)
WBC Count: 5 10*3/uL (ref 4.0–10.5)
nRBC: 0 % (ref 0.0–0.2)

## 2022-11-05 LAB — CMP (CANCER CENTER ONLY)
ALT: 25 U/L (ref 0–44)
AST: 22 U/L (ref 15–41)
Albumin: 4.2 g/dL (ref 3.5–5.0)
Alkaline Phosphatase: 64 U/L (ref 38–126)
Anion gap: 3 — ABNORMAL LOW (ref 5–15)
BUN: 18 mg/dL (ref 8–23)
CO2: 30 mmol/L (ref 22–32)
Calcium: 10.3 mg/dL (ref 8.9–10.3)
Chloride: 103 mmol/L (ref 98–111)
Creatinine: 0.67 mg/dL (ref 0.44–1.00)
GFR, Estimated: >60 mL/min (ref >60)
Glucose, Bld: 85 mg/dL (ref 70–99)
Potassium: 3.8 mmol/L (ref 3.5–5.1)
Sodium: 136 mmol/L (ref 135–145)
Total Bilirubin: 0.4 mg/dL (ref 0.3–1.2)
Total Protein: 6.5 g/dL (ref 6.5–8.1)

## 2022-11-05 NOTE — Progress Notes (Signed)
[place]  [phone] [phone]     ID: Susan Randolph DOB: 03/25/1946  MR#: [id number]  JZP#:[id number]  Patient Care Team: Josetta Huddle, MD as PCP - General (Internal Medicine) Jerline Pain, MD as PCP - Cardiology (Cardiology) Mauro Kaufmann, RN as Oncology Nurse Navigator Rockwell Germany, RN as Oncology Nurse Navigator Alphonsa Overall, MD as Consulting Physician (General Surgery) Magrinat, Virgie Dad, MD (Inactive) as Consulting Physician (Oncology) Eppie Gibson, MD as Attending Physician (Radiation Oncology) Gaynelle Arabian, MD as Consulting Physician (Orthopedic Surgery) Earnie Larsson, MD as Consulting Physician (Neurosurgery) Christophe [place], MD as Consulting Physician (Obstetrics and Gynecology) Clarene Essex, MD as Consulting Physician (Gastroenterology) Josetta Huddle, MD (Internal Medicine) Josetta Huddle, MD (Internal Medicine) Dimitri Ped, RN as [place] [facility] Benay Pike, MD  CHIEF COMPLAINT: estrogen receptor positive breast cancer  CURRENT TREATMENT: anastrozole  INTERVAL HISTORY:  Susan Randolph returns today for follow up of her estrogen receptor positive breast cancer.  She is here for follow-up since Dr. Jana Hakim retired.  She is compliant with anastrozole.  She has noticed some nighttime hot flashes for which she tried gabapentin in the past but did not like the side effects of it.  Besides the hot flashes, she denies any major complaints. Her most recent bone density screening on 08/25/2019 showed a T-score of -1.8, which is considered osteopenic. She had her last mammogram in December 2023 which was unremarkable except for dense breasts.  Rest of the pertinent 10 point ROS reviewed and negative  REVIEW OF SYSTEMS:  COVID 19 VACCINATION STATUS: [place] x2; infection 02/2021   HISTORY OF CURRENT ILLNESS: From the original intake note:  Susan Randolph had routine screening mammography on 08/02/2019  showing a possible abnormality in the left breast. She underwent left diagnostic mammography with tomography and left breast ultrasonography at [facility] on 08/08/2019 showing: breast density category B; 6 mm mass in the left breast at 12 o'clock; no enlarged or abnormal left axillary lymph nodes.  Accordingly on 08/09/2019 she proceeded to biopsy of the left breast area in question. The pathology from this procedure ([id number]) showed: invasive mammary carcinoma, grade 2, e-cadherin positive. Prognostic indicators significant for: estrogen receptor, 100% positive and progesterone receptor, 95% positive, both with strong staining intensity. Proliferation marker Ki67 at 2%. HER2 equivocal by immunohistochemistry (2+), but negative by fluorescent in situ hybridization with a signals ratio 1.19 and number per cell 1.85.  The patient's subsequent history is as detailed below.   PAST MEDICAL HISTORY: Past Medical History:  Diagnosis Date   Anxiety    Arthritis    Breast cancer ([place]) 06/2019   left IMC   Cancer ([place])    basal cell carcinoma   GERD (gastroesophageal reflux disease)    History of radiation therapy 10/24/19- 11/18/19   Left Breast 15 fractions of 2.67 Gy to total 40.05 Gy. Left breast boost 5 fractions of 2 Gy to total 10 Gy   Personal history of radiation therapy 10/2019   Rectal bleed 09/2016    PAST SURGICAL HISTORY: Past Surgical History:  Procedure Laterality Date   BREAST BIOPSY Left 08/2019   BREAST LUMPECTOMY Left 2020   BREAST LUMPECTOMY WITH RADIOACTIVE SEED AND SENTINEL LYMPH NODE BIOPSY Left 08/31/2019   Procedure: LEFT BREAST LUMPECTOMY WITH RADIOACTIVE SEED AND LEFT AXILLARY SENTINEL LYMPH NODE BIOPSY;  Surgeon: Alphonsa Overall, MD;  Location: [facility];  Service: General;  Laterality: Left;   EYE SURGERY  Corrected drooping eye lids bilateral   MENISCUS REPAIR Right 03/29/2019   PARTIAL KNEE ARTHROPLASTY Right 10/31/2020   Procedure:  Right knee medial unicompartmental arthroplasty;  Surgeon: Gaynelle Arabian, MD;  Location: [facility];  Service: Orthopedics;  Laterality: Right;  21mn   POSTERIOR CERVICAL FUSION/FORAMINOTOMY N/A 01/11/2018   Procedure: Posterior Cervical Fusion with lateral mass fixation - Cervical one-Cervical two with Iliac Crest bone graft;  Surgeon: PEarnie Larsson MD;  [place] [facility]  Service: Neurosurgery;  Laterality: N/A;   TUBAL LIGATION      FAMILY HISTORY: Family History  Problem Relation Age of Onset   Cancer Mother        breast cancer/endometrial cancer   Hypertension Mother    Breast cancer Mother    Hypertension Father    Cancer Father        Glioblastoma   Arthritis Father    Rheum arthritis Maternal Uncle    Colon cancer Maternal Uncle   Patient's father was 773years old when he died from [place] Patient's mother is 927years old as of 07/2019.  She was diagnosed with breast cancer at age 1215and with endometrial cancer at age 76 The patient denies a family hx of ovarian cancer. She has no siblings. She also notes a maternal uncle with colon cancer in his 734s   GYNECOLOGIC HISTORY:  No LMP recorded. Patient is postmenopausal. Menarche: 77years old Age at first live birth: 77years old [place] 3 LMP age [age]Contraceptive yes, unsure how long, no issues HRT yes, used for 5 years  Hysterectomy? no BSO? 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  The patient has 3 grandchildren.  She attends [employer].    ADVANCED DIRECTIVES: In the absence of any documentation to the contrary, the patient's spouse is her H42   HEALTH  MAINTENANCE: Social History   Tobacco Use   Smoking status: Never   Smokeless tobacco: Never  Vaping Use   Vaping Use: Never used  Substance Use Topics   Alcohol use: Yes    Comment: social   Drug use: No     Colonoscopy: 2014  PAP: 03/2017, negative  Bone density: scheduled 08/2019   Allergies  Allergen Reactions   Amitriptyline Other (See Comments)    tremors   Sulfa Antibiotics Other (See Comments)    Doesn't remember, happened in childhood    Current Outpatient Medications  Medication Sig Dispense Refill   acetaminophen (TYLENOL) 500 MG tablet Take 1,000 mg by mouth 2 (two) times daily as needed for mild pain.     albuterol (VENTOLIN HFA) 108 (90 Base) MCG/ACT inhaler INHALE 2 PUFFS BY MOUTH EVERY 6 HOURS AS NEEDED FOR WHEEZE OR SHORTNESS OF BREATH 18 each 6   ALPRAZolam (XANAX) 0.25 MG tablet Take 0.25 mg by mouth daily as needed for anxiety.     anastrozole (ARIMIDEX) 1 MG tablet Take 1 tablet (1 mg total) by mouth daily. 90 tablet 3   Calcium Polycarbophil (FIBER-CAPS PO) Take 2 capsules by mouth daily.     diclofenac (VOLTAREN) 50 MG EC tablet Take 50 mg by  mouth daily at 6 (six) AM.     Diclofenac 35 MG CAPS 1 capsule as needed     latanoprost (XALATAN) 0.005 % ophthalmic solution Place 1 drop into both eyes at bedtime.     loperamide (IMODIUM A-D) 2 MG tablet Take 2-4 mg by mouth 4 (four) times daily as needed for diarrhea or loose stools.     loratadine (CLARITIN) 10 MG tablet Take 10 mg by mouth daily.     Magnesium 250 MG TABS 3 (three) times a week.     nitroGLYCERIN (NITROSTAT) 0.4 MG SL tablet Place 1 tablet (0.4 mg total) under the tongue every 5 (five) minutes as needed for chest pain. Place 1 tablet (0.4 mg total) under the tongue every 5 (five) minutes up to 3 times as needed for chest pain. 25 tablet 4   pantoprazole (PROTONIX) 40 MG tablet Take by mouth.     Prednisol Ace-Moxiflox-Bromfen 1-0.5-0.075 % SUSP 4 (four) times daily.     rosuvastatin  (CRESTOR) 10 MG tablet TAKE 1 TABLET BY MOUTH EVERY DAY 90 tablet 3   SYSTANE OVERNIGHT THERAPY 0.3 % GEL ophthalmic ointment as needed for dry eyes.     SYSTANE ULTRA 0.4-0.3 % SOLN as needed (dry eyes).     traZODone (DESYREL) 50 MG tablet Take 25 mg by mouth at bedtime.     No current facility-administered medications for this visit.    OBJECTIVE: white woman who appears younger than stated age  19:   11/05/22 1302  BP: (!) 144/87  Pulse: 62  Resp: 16  Temp: 98.6 F (37 C)  SpO2: 100%       Body mass index is 25.2 kg/m.   Wt Readings from Last 3 Encounters:  11/05/22 146 lb 12.8 oz (66.6 kg)  09/02/22 146 lb (66.2 kg)  03/10/22 143 lb (64.9 kg)      ECOG FS:1 - Symptomatic but completely ambulatory  Sclerae unicteric, EOMs intact No palpable cervical adenopathy. Breast: Bilateral breasts inspected and palpated.  Scattered density noted.  No definitive palpable masses.  No regional adenopathy. No lower extremity edema  LAB RESULTS:  CMP     Component Value Date/Time   NA 136 11/05/2022 1235   NA 139 11/15/2021 0000   K 3.8 11/05/2022 1235   CL 103 11/05/2022 1235   CO2 30 11/05/2022 1235   GLUCOSE 85 11/05/2022 1235   BUN 18 11/05/2022 1235   BUN 24 11/15/2021 0000   CREATININE 0.67 11/05/2022 1235   CALCIUM 10.3 11/05/2022 1235   PROT 6.5 11/05/2022 1235   ALBUMIN 4.2 11/05/2022 1235   AST 22 11/05/2022 1235   ALT 25 11/05/2022 1235   ALKPHOS 64 11/05/2022 1235   BILITOT 0.4 11/05/2022 1235   GFRNONAA >60 11/05/2022 1235   GFRAA >60 04/09/2020 1255   GFRAA >60 08/17/2019 0846    No results found for: "TOTALPROTELP", "ALBUMINELP", "A1GS", "A2GS", "BETS", "BETA2SER", "GAMS", "MSPIKE", "SPEI"  No results found for: "KPAFRELGTCHN", "LAMBDASER", "KAPLAMBRATIO"  Lab Results  Component Value Date   WBC 5.0 11/05/2022   NEUTROABS 2.9 11/05/2022   HGB 12.6 11/05/2022   HCT 37.0 11/05/2022   MCV 89.8 11/05/2022   PLT 168 11/05/2022   No results  found for: "LABCA2"  No components found for: "KDXIPJ825"  No results for input(s): "INR" in the last 168 hours.  No results found for: "LABCA2"  No results found for: "KNL976"  No results found for: "CAN125"  No results found for: "BHA193"  No  results found for: "CA2729"  No components found for: "HGQUANT"  No results found for: "CEA1", "CEA" / No results found for: "CEA1", "CEA"   No results found for: "AFPTUMOR"  No results found for: "CHROMOGRNA"  No results found for: "HGBA", "HGBA2QUANT", "HGBFQUANT", "HGBSQUAN" (Hemoglobinopathy evaluation)   No results found for: "LDH"  No results found for: "IRON", "TIBC", "IRONPCTSAT" (Iron and TIBC)  No results found for: "FERRITIN"  Urinalysis No results found for: "COLORURINE", "APPEARANCEUR", "LABSPEC", "PHURINE", "GLUCOSEU", "HGBUR", "BILIRUBINUR", "KETONESUR", "PROTEINUR", "UROBILINOGEN", "NITRITE", "LEUKOCYTESUR"   STUDIES: No results found.   ELIGIBLE FOR AVAILABLE RESEARCH PROTOCOL: no  ASSESSMENT: 77 y.o.  woman status post left breast upper outer quadrant biopsy 08/09/2019 for a clinical T1b N0, stage IA invasive ductal carcinoma, grade 2, estrogen and progesterone receptor positive, HER-2 not amplified, with an MIB-1 of 2%.  (1) status post left lumpectomy and sentinel lymph node sampling 08/31/2019 for a pT1a pN0, stage IA invasive ductal carcinoma, with negative margins  (2) adjuvant radiation 10/24/2019-11/18/2019 Site Technique Total Dose (Gy) Dose per Fx (Gy) Completed Fx Beam Energies  Breast, Left: Breast_Lt 3D 40.05/40.05 2.67 15/15 6X  Breast, Left: Breast_Lt_Bst 3D 10/10 2 5/5 6X   (3) started anastrozole 01/08/2020  (a) bone density 08/25/2019 shows a T score of -1.8 ([facility])   PLAN:  She is tolerating anastrozole well and the plan is to continue that a total of 5 years. She will complete anastrozole in March 2026.  She is tolerating it well except for some nocturnal hot  flashes.  We have discussed options for treatment of hot flashes today however patient is reluctant to start a new medication.  She would like to continue observation for now and she will keep [place] posted if she decides to try any medication. Physical examination today unremarkable.  Next mammogram due in December 2024. Bone density due, this has been ordered. She will return to see [place] in a year.  She knows to call for any other issues that may develop before then.  Total encounter time 20 minutes.Benay Pike, MD   11/05/2022 1:33 PM Medical Oncology and Hematology [facility] [place],  [address] Tel. [phone]    Fax. [phone]  *Total Encounter Time as defined by the Centers for Medicare and Medicaid Services includes, in addition to the face-to-face time of a patient visit (documented in the note above) non-face-to-face time: obtaining and reviewing outside history, ordering and reviewing medications, tests or procedures, care coordination (communications with other health care professionals or caregivers) and documentation in the medical record.

## 2022-11-13 ENCOUNTER — Other Ambulatory Visit: Payer: Self-pay | Admitting: *Deleted

## 2022-11-13 DIAGNOSIS — Z170 Estrogen receptor positive status [ER+]: Secondary | ICD-10-CM

## 2022-11-13 NOTE — Progress Notes (Signed)
Pt had concerns of anion gap results. Per Dr.Iruku advised that it's of no concern being that all other labs were in normal range. Scheduled pt for repeat lab draw for 1/29 at 11am. Pt verbalized understanding

## 2022-11-17 ENCOUNTER — Other Ambulatory Visit: Payer: Self-pay

## 2022-11-17 ENCOUNTER — Inpatient Hospital Stay: Payer: Medicare PPO

## 2022-11-17 DIAGNOSIS — Z79899 Other long term (current) drug therapy: Secondary | ICD-10-CM | POA: Diagnosis not present

## 2022-11-17 DIAGNOSIS — C50412 Malignant neoplasm of upper-outer quadrant of left female breast: Secondary | ICD-10-CM

## 2022-11-17 DIAGNOSIS — M8580 Other specified disorders of bone density and structure, unspecified site: Secondary | ICD-10-CM | POA: Diagnosis not present

## 2022-11-17 DIAGNOSIS — Z170 Estrogen receptor positive status [ER+]: Secondary | ICD-10-CM | POA: Diagnosis not present

## 2022-11-17 DIAGNOSIS — Z79811 Long term (current) use of aromatase inhibitors: Secondary | ICD-10-CM | POA: Diagnosis not present

## 2022-11-17 DIAGNOSIS — Z923 Personal history of irradiation: Secondary | ICD-10-CM | POA: Diagnosis not present

## 2022-11-17 LAB — CMP (CANCER CENTER ONLY)
ALT: 27 U/L (ref 0–44)
AST: 23 U/L (ref 15–41)
Albumin: 4.3 g/dL (ref 3.5–5.0)
Alkaline Phosphatase: 73 U/L (ref 38–126)
Anion gap: 4 — ABNORMAL LOW (ref 5–15)
BUN: 20 mg/dL (ref 8–23)
CO2: 29 mmol/L (ref 22–32)
Calcium: 10.2 mg/dL (ref 8.9–10.3)
Chloride: 105 mmol/L (ref 98–111)
Creatinine: 0.79 mg/dL (ref 0.44–1.00)
GFR, Estimated: >60 mL/min (ref >60)
Glucose, Bld: 93 mg/dL (ref 70–99)
Potassium: 4.3 mmol/L (ref 3.5–5.1)
Sodium: 138 mmol/L (ref 135–145)
Total Bilirubin: 0.4 mg/dL (ref 0.3–1.2)
Total Protein: 6.9 g/dL (ref 6.5–8.1)

## 2022-12-12 DIAGNOSIS — Z853 Personal history of malignant neoplasm of breast: Secondary | ICD-10-CM | POA: Diagnosis not present

## 2022-12-12 DIAGNOSIS — Z01419 Encounter for gynecological examination (general) (routine) without abnormal findings: Secondary | ICD-10-CM | POA: Diagnosis not present

## 2022-12-19 ENCOUNTER — Other Ambulatory Visit: Payer: Self-pay | Admitting: Hematology and Oncology

## 2022-12-30 ENCOUNTER — Ambulatory Visit: Payer: Self-pay

## 2022-12-30 NOTE — Patient Outreach (Signed)
  Care Coordination   Follow Up Visit Note   12/30/2022 Name: Susan Randolph MRN: [id number] DOB: 05-Jan-1946  Susan Randolph is a 77 y.o. year old female who sees Susan Huddle, MD for primary care. I spoke with  Susan Randolph by phone today.  What matters to the patients health and wellness today?  I have been doing good and my B/P has been good.    Goals Addressed             This Visit's Progress    Care Coordination Activities-self management of HTN       Care Coordination Interventions: Evaluation of current treatment plan related to hypertension self management and patient's adherence to plan as established by provider Provided education to patient re: stroke prevention, s/s of heart attack and stroke Counseled on the importance of exercise goals with target of 150 minutes per week Advised patient, providing education and rationale, to monitor blood pressure daily and record, calling PCP for findings outside established parameters Provided education on prescribed diet low sodium  Interventions Today    Flowsheet Row Most Recent Value  Chronic Disease   Chronic disease during today's visit Hypertension (HTN)  General Interventions   General Interventions Discussed/Reviewed General Interventions Reviewed, Doctor Visits  Doctor Visits Discussed/Reviewed Doctor Visits Discussed, Annual Wellness Visits, PCP  PCP/Specialist Visits Compliance with follow-up visit  Exercise Interventions   Exercise Discussed/Reviewed Exercise Reviewed  Education Interventions   Education Provided Provided Education  Provided Verbal Education On Nutrition, Other  [Reviewed to check B/P daily and notify provider as needed]  Nutrition Interventions   Nutrition Discussed/Reviewed Nutrition Reviewed, Decreasing salt              SDOH assessments and interventions completed:  No     Care Coordination Interventions:  Yes, provided   Follow up plan: No further intervention required.    Encounter Outcome:  Pt. Visit Completed  Peter Garter RN, BSN,CCM, CDE Care Management Coordinator [facility] [phone]

## 2022-12-30 NOTE — Patient Instructions (Signed)
Visit Information  Thank you for taking time to visit with me today. Please don't hesitate to contact me if I can be of assistance to you.   Following are the goals we discussed today:   Goals Addressed             This Visit's Progress    Care Coordination Activities-self management of HTN       Care Coordination Interventions: Evaluation of current treatment plan related to hypertension self management and patient's adherence to plan as established by provider Provided education to patient re: stroke prevention, s/s of heart attack and stroke Counseled on the importance of exercise goals with target of 150 minutes per week Advised patient, providing education and rationale, to monitor blood pressure daily and record, calling PCP for findings outside established parameters Provided education on prescribed diet low sodium  Interventions Today    Flowsheet Row Most Recent Value  Chronic Disease   Chronic disease during today's visit Hypertension (HTN)  General Interventions   General Interventions Discussed/Reviewed General Interventions Reviewed, Doctor Visits  Doctor Visits Discussed/Reviewed Doctor Visits Discussed, Annual Wellness Visits, PCP  PCP/Specialist Visits Compliance with follow-up visit  Exercise Interventions   Exercise Discussed/Reviewed Exercise Reviewed  Education Interventions   Education Provided Provided Education  Provided Verbal Education On Nutrition, Other  [Reviewed to check B/P daily and notify provider as needed]  Nutrition Interventions   Nutrition Discussed/Reviewed Nutrition Reviewed, Decreasing salt              Our next appointment is by telephone on 02/18/23 at 10AM  Please call the care guide team at [phone] if you need to cancel or reschedule your appointment.   If you are experiencing a Mental Health or [place] or need someone to talk to, please call the Suicide and Crisis Lifeline: 988 call the [place] National  Suicide Prevention Lifeline: 630-707-5408 or TTY: (407) 224-9151 TTY (580)527-6523) to talk to a trained counselor call 1-800-273-TALK (toll free, 24 hour hotline) go to [facility] [address] [place] [phone]) call 911   Patient verbalizes understanding of instructions and care plan provided today and agrees to view in [place]. Active MyChart status and patient understanding of how to access instructions and care plan via MyChart confirmed with patient.     Telephone follow up appointment with care management team member scheduled for: 02/18/23  SIGNATURE Peter Garter RN, Jackquline Denmark, [place] [facility] [phone]

## 2023-01-15 ENCOUNTER — Telehealth: Payer: Self-pay | Admitting: Nurse Practitioner

## 2023-01-15 NOTE — Telephone Encounter (Signed)
Returned call to patient, informed her Dr. Marlou Porch and Christen Bame, NP are out of the office until 01/19/23. Her BP readings will be forwarded to both to review and advise on starting BP medications when they return.  Patient verbalized understanding and expressed appreciation for call.

## 2023-01-15 NOTE — Telephone Encounter (Signed)
Pt c/o BP issue: STAT if pt c/o blurred vision, one-sided weakness or slurred speech  1. What are your last 5 BP readings?  3/21 133/73 3/22 163/80 3/25 111/67 3/26 163/98 3/27 147/93         158/75 3/28 131/69  2. Are you having any other symptoms (ex. Dizziness, headache, blurred vision, passed out)? No symptoms.   3. What is your BP issue? Patient states last time she was in it was discussed the possibility of going on BP medications if patient saw her BP was elevated.  Patient states she has noticed some days her BP is a little high she would like to start BP medications.

## 2023-01-21 MED ORDER — LISINOPRIL 10 MG PO TABS: 10.0000 mg | ORAL_TABLET | Freq: Every day | ORAL | 3 refills | Status: DC

## 2023-01-21 NOTE — Telephone Encounter (Signed)
Would recommend that she resume lisinopril 10 mg since she has tolerated it in the past. Continue to monitor BP at least 2 hours after taking medication and bring to office visit with me.

## 2023-01-21 NOTE — Telephone Encounter (Signed)
S/w pt is agreeable to treatment plan. Sending in # 90 day supply of Lisinopril one (1) tablet by mouth ( 10 mg) daily to requested pharmacy. Pt will bring BP readings to f/u appt.

## 2023-02-03 DIAGNOSIS — L298 Other pruritus: Secondary | ICD-10-CM | POA: Diagnosis not present

## 2023-02-03 DIAGNOSIS — L710 Perioral dermatitis: Secondary | ICD-10-CM | POA: Diagnosis not present

## 2023-02-04 DIAGNOSIS — H35371 Puckering of macula, right eye: Secondary | ICD-10-CM | POA: Diagnosis not present

## 2023-02-04 DIAGNOSIS — Z961 Presence of intraocular lens: Secondary | ICD-10-CM | POA: Diagnosis not present

## 2023-02-04 DIAGNOSIS — H401131 Primary open-angle glaucoma, bilateral, mild stage: Secondary | ICD-10-CM | POA: Diagnosis not present

## 2023-02-13 ENCOUNTER — Other Ambulatory Visit: Payer: Self-pay | Admitting: Cardiology

## 2023-02-16 ENCOUNTER — Ambulatory Visit: Payer: Medicare PPO | Admitting: Nurse Practitioner

## 2023-02-18 ENCOUNTER — Ambulatory Visit: Payer: Self-pay

## 2023-02-18 NOTE — Patient Outreach (Signed)
  Care Coordination   Follow Up Visit Note   02/18/2023 Name: Susan Randolph MRN: [id number] DOB: 1946-06-15  Susan Randolph is a 77 y.o. year old female who sees Emilio Aspen, MD for primary care. I spoke with  Susan Randolph by phone today.  What matters to the patients health and wellness today?  My B/P is doing good now that I am back on lisinopril.  My B/P was 121/70 this morning    Goals Addressed             This Visit's Progress    COMPLETED: Care Coordination Activities-self management of HTN        Interventions Today    Flowsheet Row Most Recent Value  Chronic Disease   Chronic disease during today's visit Hypertension (HTN)  General Interventions   General Interventions Discussed/Reviewed General Interventions Reviewed, Doctor Visits, Durable Medical Equipment (DME)  Doctor Visits Discussed/Reviewed Doctor Visits Reviewed, PCP, Specialist  Durable Medical Equipment (DME) BP Cuff  [Reviewed to continue to check B/P daily and notify MD of abnormal readings]  PCP/Specialist Visits Compliance with follow-up visit  [Reviewed to keep appt with PCP on 03/25/23 and cardiology 04/20/23]  Exercise Interventions   Exercise Discussed/Reviewed Exercise Reviewed, Physical Activity  Physical Activity Discussed/Reviewed Physical Activity Reviewed, Home Exercise Program (HEP)  [Discussed plan to walk 2-3 times a week for 30-40 minutes and to increase number of days as tolerated]  Education Interventions   Education Provided Provided Education  Provided Verbal Education On Nutrition, Exercise, When to see the doctor  Nutrition Interventions   Nutrition Discussed/Reviewed Nutrition Reviewed, Decreasing salt  Pharmacy Interventions   Pharmacy Dicussed/Reviewed Pharmacy Topics Reviewed      Goals completed and case closed         SDOH assessments and interventions completed:  Yes  SDOH Interventions Today    Flowsheet Row Most Recent Value  SDOH Interventions   Physical  Activity Interventions Other (Comments)  [Reviewed benefits of regular exercise]        Care Coordination Interventions:  Yes, provided   Follow up plan: No further intervention required. Goals completed and case closed  Encounter Outcome:  Pt. Visit Completed  Dudley Major RN, BSN,CCM, CDE Care Management Coordinator [facility] Care Management [phone]

## 2023-02-18 NOTE — Patient Instructions (Signed)
Visit Information  Thank you for taking time to visit with me today. Please don't hesitate to contact me if I can be of assistance to you.   Following are the goals we discussed today:   Goals Addressed             This Visit's Progress    COMPLETED: Care Coordination Activities-self management of HTN        Interventions Today    Flowsheet Row Most Recent Value  Chronic Disease   Chronic disease during today's visit Hypertension (HTN)  General Interventions   General Interventions Discussed/Reviewed General Interventions Reviewed, Doctor Visits, Durable Medical Equipment (DME)  Doctor Visits Discussed/Reviewed Doctor Visits Reviewed, PCP, Specialist  Durable Medical Equipment (DME) BP Cuff  [Reviewed to continue to check B/P daily and notify MD of abnormal readings]  PCP/Specialist Visits Compliance with follow-up visit  [Reviewed to keep appt with PCP on 03/25/23 and cardiology 04/20/23]  Exercise Interventions   Exercise Discussed/Reviewed Exercise Reviewed, Physical Activity  Physical Activity Discussed/Reviewed Physical Activity Reviewed, Home Exercise Program (HEP)  [Discussed plan to walk 2-3 times a week for 30-40 minutes and to increase number of days as tolerated]  Education Interventions   Education Provided Provided Education  Provided Verbal Education On Nutrition, Exercise, When to see the doctor  Nutrition Interventions   Nutrition Discussed/Reviewed Nutrition Reviewed, Decreasing salt  Pharmacy Interventions   Pharmacy Dicussed/Reviewed Pharmacy Topics Reviewed      Goals completed and case closed         If you are experiencing a Mental Health or Behavioral Health Crisis or need someone to talk to, please call the Suicide and Crisis Lifeline: 988 call the [place] National Suicide Prevention Lifeline: 986-598-8328 or TTY: (606)710-2357 TTY 7208045843) to talk to a trained counselor call 1-800-273-TALK (toll free, 24 hour hotline) go to [facility] [address], [place] [phone]) call 911   Patient verbalizes understanding of instructions and care plan provided today and agrees to view in MyChart. Active MyChart status and patient understanding of how to access instructions and care plan via MyChart confirmed with patient.     No further follow up required:    SIGNATURE Dudley Major RN, Maximiano Coss, CDE Care Management Coordinator [facility] Care Management [phone]

## 2023-02-20 ENCOUNTER — Ambulatory Visit: Payer: Medicare PPO | Admitting: Nurse Practitioner

## 2023-02-26 DIAGNOSIS — R0609 Other forms of dyspnea: Secondary | ICD-10-CM | POA: Diagnosis not present

## 2023-02-26 DIAGNOSIS — Z79899 Other long term (current) drug therapy: Secondary | ICD-10-CM | POA: Diagnosis not present

## 2023-02-26 DIAGNOSIS — F419 Anxiety disorder, unspecified: Secondary | ICD-10-CM | POA: Diagnosis not present

## 2023-02-26 DIAGNOSIS — R002 Palpitations: Secondary | ICD-10-CM | POA: Diagnosis not present

## 2023-02-26 DIAGNOSIS — I110 Hypertensive heart disease with heart failure: Secondary | ICD-10-CM | POA: Diagnosis not present

## 2023-02-26 DIAGNOSIS — R5383 Other fatigue: Secondary | ICD-10-CM | POA: Diagnosis not present

## 2023-02-26 DIAGNOSIS — I700 Atherosclerosis of aorta: Secondary | ICD-10-CM | POA: Diagnosis not present

## 2023-02-26 DIAGNOSIS — E559 Vitamin D deficiency, unspecified: Secondary | ICD-10-CM | POA: Diagnosis not present

## 2023-02-27 ENCOUNTER — Telehealth: Payer: Self-pay | Admitting: Cardiology

## 2023-02-27 NOTE — Telephone Encounter (Signed)
Patient stated she saw her PCP yesterday and was put on a heart monitor and is now recommending patient have a stress test.  Patient stated her PCP was sending over information regarding the monitor results.  Patient is calling to follow-up on getting orders for the stress test.

## 2023-03-02 NOTE — Telephone Encounter (Signed)
I received a message from her PCP about concerns for worsening dyspnea. I recommend that she be scheduled to see one of [place] as soon as she is able to come in. Unfortunately, I am in preop all week but she can see Dr. Anne Fu or any of our APPs.   Thank you, Marcelino Duster

## 2023-03-02 NOTE — Telephone Encounter (Signed)
Spoke with the patient and scheduled her for an appointment with Dr. Anne Fu on Wednesday 5/15.

## 2023-03-04 ENCOUNTER — Encounter: Payer: Self-pay | Admitting: Cardiology

## 2023-03-04 ENCOUNTER — Ambulatory Visit: Payer: Medicare PPO | Attending: Cardiology | Admitting: Cardiology

## 2023-03-04 VITALS — BP 154/96 | HR 75 | Ht 67.0 in | Wt 149.0 lb

## 2023-03-04 DIAGNOSIS — I10 Essential (primary) hypertension: Secondary | ICD-10-CM

## 2023-03-04 DIAGNOSIS — E785 Hyperlipidemia, unspecified: Secondary | ICD-10-CM

## 2023-03-04 DIAGNOSIS — I2510 Atherosclerotic heart disease of native coronary artery without angina pectoris: Secondary | ICD-10-CM

## 2023-03-04 MED ORDER — OLMESARTAN MEDOXOMIL 20 MG PO TABS: 20.0000 mg | ORAL_TABLET | Freq: Every day | ORAL | 3 refills | Status: DC

## 2023-03-04 MED ORDER — EMPAGLIFLOZIN 10 MG PO TABS: 10.0000 mg | ORAL_TABLET | Freq: Every day | ORAL | 6 refills | Status: DC

## 2023-03-04 NOTE — Progress Notes (Signed)
Cardiology Office Note:    Date:  03/04/2023   ID:  Susan Randolph, DOB 1946-09-05, MRN [id number]  PCP:  Emilio Aspen, MD  [facility] Cardiologist:  Donato Schultz, MD  [facility] Electrophysiologist:  None   Referring MD: Emilio Aspen, *     History of Present Illness:    Susan Randolph is a 77 y.o. female with history of estrogen receptor positive breast cancer followed by Dr. Darnelle Catalan here for the evaluation of hypertension, review of cardiac prevention.    Here with SOB, light headed. Lisinopril just started but BP too high. Now with Dr. Orson Aloe. Blood work done. Calcium was elevated. SOB. Stress. Mother 100 in 11/23. Bill husband. Lexapro now started.   Has family history: Mother AFIB and TIA. Grandfathers had heart issues. Wants prevention. Has Pollina watch. Her father had a seizure and passed away from a aneurism.     Past Medical History:  Diagnosis Date   Anxiety    Arthritis    Breast cancer (HCC) 06/2019   left IMC   Cancer (HCC)    basal cell carcinoma   GERD (gastroesophageal reflux disease)    History of radiation therapy 10/24/19- 11/18/19   Left Breast 15 fractions of 2.67 Gy to total 40.05 Gy. Left breast boost 5 fractions of 2 Gy to total 10 Gy   Personal history of radiation therapy 10/2019   Rectal bleed 09/2016    Past Surgical History:  Procedure Laterality Date   BREAST BIOPSY Left 08/2019   BREAST LUMPECTOMY Left 2020   BREAST LUMPECTOMY WITH RADIOACTIVE SEED AND SENTINEL LYMPH NODE BIOPSY Left 08/31/2019   Procedure: LEFT BREAST LUMPECTOMY WITH RADIOACTIVE SEED AND LEFT AXILLARY SENTINEL LYMPH NODE BIOPSY;  Surgeon: Ovidio Kin, MD;  Location: [facility];  Service: General;  Laterality: Left;   EYE SURGERY     Corrected drooping eye lids bilateral   MENISCUS REPAIR Right 03/29/2019   PARTIAL KNEE ARTHROPLASTY Right 10/31/2020   Procedure: Right knee medial unicompartmental arthroplasty;  Surgeon: Ollen Gross, MD;  Location: [facility];  Service: Orthopedics;  Laterality: Right;    POSTERIOR CERVICAL FUSION/FORAMINOTOMY N/A 01/11/2018   Procedure: Posterior Cervical Fusion with lateral mass fixation - Cervical one-Cervical two with Iliac Crest bone graft;  Surgeon: Julio Sicks, MD;  Location: [facility] OR;  Service: Neurosurgery;  Laterality: N/A;   TUBAL LIGATION      Current Medications: Current Meds  Medication Sig   acetaminophen (TYLENOL) 500 MG tablet Take 1,000 mg by mouth 2 (two) times daily as needed for mild pain.   ALPRAZolam (XANAX) 0.25 MG tablet Take 0.25 mg by mouth daily as needed for anxiety.   anastrozole (ARIMIDEX) 1 MG tablet TAKE 1 TABLET BY MOUTH EVERY DAY   Calcium Polycarbophil (FIBER-CAPS PO) Take 2 capsules by mouth daily.   diclofenac (VOLTAREN) 50 MG EC tablet Take 50 mg by mouth daily at 6 (six) AM.   empagliflozin (JARDIANCE) 10 MG TABS tablet Take 1 tablet (10 mg total) by mouth daily before breakfast.   escitalopram (LEXAPRO) 10 MG tablet Take 10 mg by mouth daily.   loperamide (IMODIUM A-D) 2 MG tablet Take 2-4 mg by mouth 4 (four) times daily as needed for diarrhea or loose stools.   loratadine (CLARITIN) 10 MG tablet Take 10 mg by mouth daily.   Magnesium 250 MG TABS 3 (three) times a week.   olmesartan (BENICAR) 20 MG tablet Take 1 tablet (20 mg total) by  mouth daily.   pantoprazole (PROTONIX) 40 MG tablet Take by mouth.   rosuvastatin (CRESTOR) 10 MG tablet TAKE 1 TABLET BY MOUTH EVERY DAY   SYSTANE OVERNIGHT THERAPY 0.3 % GEL ophthalmic ointment as needed for dry eyes.   SYSTANE ULTRA 0.4-0.3 % SOLN as needed (dry eyes).   traZODone (DESYREL) 50 MG tablet Take 25 mg by mouth at bedtime.   [DISCONTINUED] lisinopril (ZESTRIL) 10 MG tablet Take 1 tablet (10 mg total) by mouth daily.     Allergies:   Amitriptyline and Sulfa antibiotics   Social History   Socioeconomic History   Marital status: Married    Spouse name: Not on file   Number of children:  Not on file   Years of education: Not on file   Highest education level: Not on file  Occupational History   Not on file  Tobacco Use   Smoking status: Never   Smokeless tobacco: Never  Vaping Use   Vaping Use: Never used  Substance and Sexual Activity   Alcohol use: Yes    Comment: social   Drug use: No   Sexual activity: Not on file  Other Topics Concern   Not on file  Social History Narrative   Not on file   Social Determinants of Health   Financial Resource Strain: Not on file  Food Insecurity: No Food Insecurity (10/23/2022)   Hunger Vital Sign    Worried About Running Out of Food in the Last Year: Never true    Ran Out of Food in the Last Year: Never true  Transportation Needs: No Transportation Needs (10/23/2022)   PRAPARE - Administrator, Civil Service (Medical): No    Lack of Transportation (Non-Medical): No  Physical Activity: Insufficiently Active (02/18/2023)   Exercise Vital Sign    Days of Exercise per Week: 1 day    Minutes of Exercise per Session: 30 min  Stress: Not on file  Social Connections: Not on file     Family History: The patient's family history includes Arthritis in her father; Breast cancer in her mother; Cancer in her father and mother; Colon cancer in her maternal uncle; Hypertension in her father and mother; Rheum arthritis in her maternal uncle.  ROS:   Please see the history of present illness.    (+) Chest Discomfort (+) Shortness of Breath (+) Fatigue All other systems reviewed and are negative.  EKGs/Labs/Other Studies Reviewed:    The following studies were reviewed today:  Cardiac Studies & Procedures       ECHOCARDIOGRAM  ECHOCARDIOGRAM COMPLETE 11/22/2021  Narrative ECHOCARDIOGRAM REPORT    Patient Name:   Susan Randolph Date of Exam: 11/22/2021 Medical Rec #:  [id number]      Height:       64.0 in Accession #:    [id number]     Weight:       143.0 lb Date of Birth:  September 19, 1946      BSA:          1.696  m Patient Age:    75 years       BP:           128/80 mmHg Patient Gender: F              HR:           71 bpm. Exam Location:  Outpatient  Procedure: 2D Echo, Cardiac Doppler, Color Doppler and Strain Analysis  Indications:    R07.9* Chest pain, unspecified;  R06.9 DOE  History:        Patient has no prior history of Echocardiogram examinations. Signs/Symptoms:Chest Pain and Dyspnea; Risk Factors:Hypertension, Dyslipidemia and Non-Smoker. Patient complains of chest tightness with DOE. She denies leg edema. Patient contracted CoVid-19 x 3 times, most recently in October 2022. Patient had left breast cancer with radiation only,.  Sonographer:    Carlos American RVT, RDCS (AE), RDMS Referring Phys: [id number] [address] George SWINYER  IMPRESSIONS   1. Left ventricular ejection fraction, by estimation, is 65 to 70%. The left ventricle has normal function. The left ventricle has no regional wall motion abnormalities. Left ventricular diastolic parameters are consistent with Grade II diastolic dysfunction (pseudonormalization). 2. Right ventricular systolic function is normal. The right ventricular size is normal. There is normal pulmonary artery systolic pressure. 3. Left atrial size was moderately dilated. 4. Right atrial size was mildly dilated. 5. A small pericardial effusion is present. There is no evidence of cardiac tamponade. 6. The mitral valve is normal in structure. Trivial mitral valve regurgitation. No evidence of mitral stenosis. 7. The aortic valve is tricuspid. Aortic valve regurgitation is not visualized. No aortic stenosis is present. 8. The inferior vena cava is normal in size with greater than 50% respiratory variability, suggesting right atrial pressure of 3 mmHg.  Comparison(s): No prior Echocardiogram.  Conclusion(s)/Recommendation(s): Otherwise normal echocardiogram, with minor abnormalities described in the report. Small pericardial effusion without tamponade, otherwise normal  echo.  FINDINGS Left Ventricle: Left ventricular ejection fraction, by estimation, is 65 to 70%. The left ventricle has normal function. The left ventricle has no regional wall motion abnormalities. The left ventricular internal cavity size was normal in size. There is no left ventricular hypertrophy. Left ventricular diastolic parameters are consistent with Grade II diastolic dysfunction (pseudonormalization).  Right Ventricle: The right ventricular size is normal. No increase in right ventricular wall thickness. Right ventricular systolic function is normal. There is normal pulmonary artery systolic pressure. The tricuspid regurgitant velocity is 2.60 m/s, and with an assumed right atrial pressure of 3 mmHg, the estimated right ventricular systolic pressure is 30.0 mmHg.  Left Atrium: Left atrial size was moderately dilated.  Right Atrium: Right atrial size was mildly dilated.  Pericardium: A small pericardial effusion is present. There is no evidence of cardiac tamponade.  Mitral Valve: The mitral valve is normal in structure. Trivial mitral valve regurgitation. No evidence of mitral valve stenosis.  Tricuspid Valve: The tricuspid valve is normal in structure. Tricuspid valve regurgitation is trivial. No evidence of tricuspid stenosis.  Aortic Valve: The aortic valve is tricuspid. Aortic valve regurgitation is not visualized. No aortic stenosis is present. Aortic valve mean gradient measures 6.0 mmHg. Aortic valve peak gradient measures 11.4 mmHg. Aortic valve area, by VTI measures 1.26 cm.  Pulmonic Valve: The pulmonic valve was not well visualized. Pulmonic valve regurgitation is mild. No evidence of pulmonic stenosis.  Aorta: The aortic root, ascending aorta, aortic arch and descending aorta are all structurally normal, with no evidence of dilitation or obstruction.  Venous: The inferior vena cava is normal in size with greater than 50% respiratory variability, suggesting right  atrial pressure of 3 mmHg.  IAS/Shunts: The atrial septum is grossly normal.   LEFT VENTRICLE PLAX 2D LVIDd:         5.26 cm     Diastology LVIDs:         2.96 cm     LV e' medial:    6.31 cm/s LV PW:  0.67 cm     LV E/e' medial:  15.5 LV IVS:        0.69 cm     LV e' lateral:   6.53 cm/s LVOT diam:     1.80 cm     LV E/e' lateral: 15.0 LV SV:         47 LV SV Index:   28 LVOT Area:     2.54 cm  3D Volume EF: LV Volumes (MOD)           3D EF:        72 % LV vol d, MOD A2C: 53.6 ml LV EDV:       105 ml LV vol d, MOD A4C: 48.6 ml LV ESV:       30 ml LV vol s, MOD A4C: 17.4 ml LV SV:        76 ml LV SV MOD A4C:     48.6 ml  RIGHT VENTRICLE RV S prime:     20.60 cm/s TAPSE (M-mode): 2.3 cm  LEFT ATRIUM           Index        RIGHT ATRIUM           Index LA diam:      3.30 cm 1.95 cm/m   RA Area:     14.10 cm LA Vol (A4C): 58.2 ml 34.31 ml/m  RA Volume:   34.40 ml  20.28 ml/m AORTIC VALVE                     PULMONIC VALVE AV Area (Vmax):    1.47 cm      PV Vmax:          1.12 m/s AV Area (Vmean):   1.08 cm      PV Peak grad:     5.0 mmHg AV Area (VTI):     1.26 cm      PR End Diast Vel: 6.97 msec AV Vmax:           169.00 cm/s AV Vmean:          120.000 cm/s AV VTI:            0.373 m AV Peak Grad:      11.4 mmHg AV Mean Grad:      6.0 mmHg LVOT Vmax:         97.70 cm/s LVOT Vmean:        50.900 cm/s LVOT VTI:          0.184 m LVOT/AV VTI ratio: 0.49  AORTA Ao Root diam: 3.00 cm Ao Asc diam:  3.60 cm Ao Arch diam: 3.2 cm  MITRAL VALVE                TRICUSPID VALVE MV Area (PHT): 3.61 cm     TR Peak grad:   27.0 mmHg MV Decel Time: 210 msec     TR Vmax:        260.00 cm/s MV E velocity: 97.90 cm/s MV A velocity: 110.00 cm/s  SHUNTS MV E/A ratio:  0.89         Systemic VTI:  0.18 m Systemic Diam: 1.80 cm  Jodelle Red MD Electronically signed by Jodelle Red MD Signature Date/Time: 11/22/2021/6:05:50 PM    Final     CT  SCANS  CT CORONARY MORPH W/CTA COR W/SCORE 11/28/2021  Addendum 11/28/2021  5:06 PM ADDENDUM REPORT: 11/28/2021 17:03  EXAM: OVER-READ INTERPRETATION  CT CHEST  The following report is an over-read performed by radiologist Dr. Jarrett Ables [facility] on 11/28/2021. This over-read does not include interpretation of cardiac or coronary anatomy or pathology. The coronary CTA interpretation by the cardiologist is attached.  COMPARISON:  Chest radiograph 12/07/2019  FINDINGS: Asymmetric density in the left breast immediately anterior to the pectoralis muscle with adjacent clips, likely representing site of prior biopsy and lumpectomy in 2020. Mild dependent atelectasis in both lower lobes.  IMPRESSION: 1. Prior lumpectomy site in the left breast noted. 2. Mild dependent atelectasis in both lower lobes.   Electronically Signed By: Gaylyn Rong M.D. On: 11/28/2021 17:03  Narrative HISTORY: 77 yo female with chest pain, nonspecific  EXAM: Cardiac/Coronary CTA  TECHNIQUE: The patient was scanned on a [employer].  PROTOCOL: A 100 kV prospective scan was triggered in the descending thoracic aorta at 111 HU's. Axial non-contrast 3 mm slices were carried out through the heart. The data set was analyzed on a dedicated work station and scored using the Agatson method. Gantry rotation speed was 250 msecs and collimation was .6 mm. Beta blockade and 0.8 mg of sl NTG was given. The 3D data set was reconstructed in 5% intervals of the 35-75 % of the R-R cycle. Diastolic phases were analyzed on a dedicated work station using MPR, MIP and VRT modes. The patient received 95mL OMNIPAQUE IOHEXOL 350 MG/ML SOLN of contrast.  FINDINGS: Quality: Excellent, HR 57  Coronary calcium score: The patient's coronary artery calcium score is 41.9, which places the patient in the 50th percentile.  Coronary arteries: Normal coronary origins.  Right  dominance.  Right Coronary Artery: Dominant. Minimal mixed proximal stenosis (1-24%, CADRADS1). Normal R-PDA and R-PLB branches.  Left Main Coronary Artery: Normal. Bifurcates into the LAD and LCx arteries.  Left Anterior Descending Coronary Artery: Anterior vessel which reachest the apex. Minimal mixed proximal stenosis (1-24%, CADRADS1). Large D1 branch without disease.  Left Circumflex Artery: Smaller lateral branch without disease.  Aorta: Normal size, 31 mm at the mid ascending aorta (level of the PA bifurcation) measured double oblique. No calcifications. No dissection.  Aortic Valve: Trileaflet. No calcifications.  Other findings:  Normal pulmonary vein drainage into the left atrium.  Normal left atrial appendage without a thrombus.  Normal size of the pulmonary artery.  IMPRESSION: 1. Minimal mixed non-obstructive CAD, CADRADS = 1.  2. Coronary calcium score of 41.9. This was 50th percentile for age and sex matched control.  3. Normal coronary origin with right dominance.  4. Consider non-coronary causes of chest pain.  Electronically Signed: By: Chrystie Nose M.D. On: 11/28/2021 16:02           EKG:  EKG is personally reviewed. 03/04/2023: Normal sinus rhythm 75 no other abnormalities.  Inferior infarct. 09/26/2020: sinus bradycardia 55 with no other abnormalities.  Recent Labs: 11/05/2022: Hemoglobin 12.6; Platelet Count 168 11/17/2022: ALT 27; BUN 20; Creatinine 0.79; Potassium 4.3; Sodium 138  Recent Lipid Panel    Component Value Date/Time   CHOL 135 12/26/2020 0842   TRIG 125 12/26/2020 0842   HDL 50 12/26/2020 0842   CHOLHDL 2.7 12/26/2020 0842   LDLCALC 63 12/26/2020 0842     Risk Assessment/Calculations:       Physical Exam:    VS:  BP (!) 154/96   Pulse 75   Ht 5\' 7"  (1.702 m)   Wt 149 lb (67.6 kg)   SpO2 98%   BMI 23.34 kg/m  Wt Readings from Last 3 Encounters:  03/04/23 149 lb (67.6 kg)  11/05/22 146 lb 12.8 oz (66.6  kg)  09/02/22 146 lb (66.2 kg)     GEN: Well nourished, well developed, in no acute distress HEENT: normal Neck: no JVD, carotid bruits, or masses Cardiac: RRR; no murmurs, rubs, or gallops,no edema  Respiratory:  clear to auscultation bilaterally, normal work of breathing GI: soft, nontender, nondistended, + BS MS: no deformity or atrophy Skin: warm and dry, no rash Neuro:  Alert and Oriented x 3, Strength and sensation are intact Psych: euthymic mood, full affect   ASSESSMENT:    1. Coronary artery disease involving native coronary artery of native heart without angina pectoris   2. Hyperlipidemia LDL goal <70   3. Essential hypertension      PLAN:    In order of problems listed above:   Coronary artery disease involving native coronary artery of native heart without angina pectoris Mild nonobstructive coronary artery disease.  50th percentile coronary calcium. Continue with Crestor for plaque stabilization.  Last LDL 49.  Excellent.  No myalgias.  She does not need a stress test.  Essential hypertension Tried lisinopril 10 mg but still hypertensive.  I will stop the lisinopril and start olmesartan 20 mg a day.  She has had hypotension in the past.  Be careful.  Diastolic dysfunction Grade 2 diastolic dysfunction seen on echocardiogram.  Personally reviewed images as well.  Shortness of breath still continues.  We will go ahead and start Jardiance 10 mg a day.  SGLT2 inhibitor is in her guidelines for diastolic dysfunction.  Watch for any signals of urinary tract infection.  Vasovagal syncope Had an episode at [facility] hot, flushed, faint.  Was observed overnight.  Extensive work-up, unremarkable.  Continue with adequate hydration.  Shortness of breath Enjoyed seeing pulmonary.  PFTs performed.  1 chest x-ray was personally reviewed, hyperinflation of lungs/emphysematous changes potentially noted.  Explained to them how this was fairly nonspecific.  She did have  aspiration during the vasovagal episode.  Hyperlipidemia Crestor. 61.  Excellent.   Shared Decision Making/Informed Consent      Follow-up 2-3 months APP  Medication Adjustments/Labs and Tests Ordered: Current medicines are reviewed at length with the patient today.  Concerns regarding medicines are outlined above.  Orders Placed This Encounter  Procedures   EKG 12-Lead   Meds ordered this encounter  Medications   olmesartan (BENICAR) 20 MG tablet    Sig: Take 1 tablet (20 mg total) by mouth daily.    Dispense:  90 tablet    Refill:  3   empagliflozin (JARDIANCE) 10 MG TABS tablet    Sig: Take 1 tablet (10 mg total) by mouth daily before breakfast.    Dispense:  30 tablet    Refill:  6    Patient Instructions  Medication Instructions:  Please discontinue your Lisinopril. Start Olmesartan 20 mg a day. And Jardiance 10 mg a day.  *If you need a refill on your cardiac medications before your next appointment, please call your pharmacy*   Follow-Up: At [facility], you and your health needs are our priority.  As part of our continuing mission to provide you with exceptional heart care, we have created designated Provider Care Teams.  These Care Teams include your primary Cardiologist (physician) and Advanced Practice Providers (APPs -  Physician Assistants and Nurse Practitioners) who all work together to provide you with the care you need, when you need it.  We recommend signing up for the patient portal called "MyChart".  Sign up information is provided on this After Visit Summary.  MyChart is used to connect with patients for Virtual Visits (Telemedicine).  Patients are able to view lab/test results, encounter notes, upcoming appointments, etc.  Non-urgent messages can be sent to your provider as well.   To learn more about what you can do with MyChart, go to [email or web address].    Your next appointment:   As scheduled with Marcelino Duster  .   Signed, Donato Schultz, MD  03/04/2023 3:55 PM    [facility]

## 2023-03-04 NOTE — Patient Instructions (Signed)
Medication Instructions:  Please discontinue your Lisinopril. Start Olmesartan 20 mg a day. And Jardiance 10 mg a day.  *If you need a refill on your cardiac medications before your next appointment, please call your pharmacy*   Follow-Up: At [facility], you and your health needs are our priority.  As part of our continuing mission to provide you with exceptional heart care, we have created designated Provider Care Teams.  These Care Teams include your primary Cardiologist (physician) and Advanced Practice Providers (APPs -  Physician Assistants and Nurse Practitioners) who all work together to provide you with the care you need, when you need it.  We recommend signing up for the patient portal called "MyChart".  Sign up information is provided on this After Visit Summary.  MyChart is used to connect with patients for Virtual Visits (Telemedicine).  Patients are able to view lab/test results, encounter notes, upcoming appointments, etc.  Non-urgent messages can be sent to your provider as well.   To learn more about what you can do with MyChart, go to [email or web address].    Your next appointment:   As scheduled with Marcelino Duster

## 2023-03-10 DIAGNOSIS — L710 Perioral dermatitis: Secondary | ICD-10-CM | POA: Diagnosis not present

## 2023-03-10 DIAGNOSIS — L738 Other specified follicular disorders: Secondary | ICD-10-CM | POA: Diagnosis not present

## 2023-03-10 DIAGNOSIS — L821 Other seborrheic keratosis: Secondary | ICD-10-CM | POA: Diagnosis not present

## 2023-03-10 DIAGNOSIS — R002 Palpitations: Secondary | ICD-10-CM | POA: Diagnosis not present

## 2023-03-11 DIAGNOSIS — R002 Palpitations: Secondary | ICD-10-CM | POA: Diagnosis not present

## 2023-03-25 DIAGNOSIS — I2510 Atherosclerotic heart disease of native coronary artery without angina pectoris: Secondary | ICD-10-CM | POA: Diagnosis not present

## 2023-03-25 DIAGNOSIS — Z23 Encounter for immunization: Secondary | ICD-10-CM | POA: Diagnosis not present

## 2023-03-25 DIAGNOSIS — I110 Hypertensive heart disease with heart failure: Secondary | ICD-10-CM | POA: Diagnosis not present

## 2023-03-25 DIAGNOSIS — K219 Gastro-esophageal reflux disease without esophagitis: Secondary | ICD-10-CM | POA: Diagnosis not present

## 2023-03-25 DIAGNOSIS — I5032 Chronic diastolic (congestive) heart failure: Secondary | ICD-10-CM | POA: Diagnosis not present

## 2023-03-25 DIAGNOSIS — C50412 Malignant neoplasm of upper-outer quadrant of left female breast: Secondary | ICD-10-CM | POA: Diagnosis not present

## 2023-03-25 DIAGNOSIS — R309 Painful micturition, unspecified: Secondary | ICD-10-CM | POA: Diagnosis not present

## 2023-03-25 DIAGNOSIS — Z0000 Encounter for general adult medical examination without abnormal findings: Secondary | ICD-10-CM | POA: Diagnosis not present

## 2023-03-25 DIAGNOSIS — I700 Atherosclerosis of aorta: Secondary | ICD-10-CM | POA: Diagnosis not present

## 2023-04-01 ENCOUNTER — Telehealth: Payer: Self-pay | Admitting: *Deleted

## 2023-04-01 NOTE — Telephone Encounter (Signed)
   Primary Cardiologist: Donato Schultz, MD  Chart reviewed as part of pre-operative protocol coverage. Given past medical history and time since last visit, based on ACC/AHA guidelines, Susan Randolph would be at acceptable risk for the planned procedure without further cardiovascular testing.   There were no specific cardiac concerns at her most recent office visit and her EKG was without concerning features.   Patient was advised that if she develops new symptoms prior to surgery to contact our office to arrange a follow-up appointment.  He verbalized understanding.  Current recommendation is to hold Jardiance for 72 hours prior to procedure.   I will route this recommendation to the requesting party via Epic fax function and remove from pre-op pool.  Please call with questions.  Levi Aland, NP-C  04/01/2023, 10:35 AM [address] Office [phone] Fax [phone]

## 2023-04-01 NOTE — Telephone Encounter (Signed)
   Pre-operative Risk Assessment    Patient Name: Susan Randolph  DOB: 12/22/1945 MRN: [id number]      Request for Surgical Clearance    Procedure:   EXCISIONAL BIOPSY OF LESION ON THE JUNCTION OF SOFT / HARD PALATE  Date of Surgery:  Clearance TBD                                 Surgeon:   Surgeon's Group or Practice Name:  [facility] Phone number:  [phone] Fax number:  [phone]   Type of Clearance Requested:   - Medical    BUT HAVE A QUESTION ON THE CLEARANCE IF PT SHOULD HOLD JARDIANCE BEFORE SURGERY AND IF SO, HOW LONG?    Type of Anesthesia:   CONSCIOUS SEDATION   Additional requests/questions:   They are asking for most current EKG, Last progress note,  Signed, Elliot Cousin   04/01/2023, 9:11 AM

## 2023-04-13 DIAGNOSIS — L710 Perioral dermatitis: Secondary | ICD-10-CM | POA: Diagnosis not present

## 2023-04-18 NOTE — Progress Notes (Unsigned)
Cardiology Office Note:    Date:  04/20/2023   ID:  [name], DOB 06-09-1946, MRN [id number]  PCP:  Susan Aspen, MD   [facility] Providers Cardiologist:  Susan Schultz, MD     Referring MD: Susan Noble, MD   No chief complaint on file. Chest pain  History of Present Illness:    Susan Randolph is a 77 y.o. female with a hx of HTN, aortic atherosclerosis by CT, estrogen receptor positive breast cancer, nonobstructive CAD, and hyperlipidemia.   She was referred to cardiology by PCP for hypertension, review of cardiac prevention and was evaluated by Dr. Anne Randolph on 09/26/2020.  A CT scan of her abdomen and pelvis in October 2018 was personally reviewed by Dr. Anne Randolph and reported to demonstrate evidence of descending aortic atherosclerosis. Due to this finding and an elevated LDL cholesterol, she was started on Crestor 10 mg.  She reported taking antihypertensives in the past which were eventually stopped for blood pressure in the 90s.  Her EKG revealed on his bradycardia at 55 bpm with no other abnormalities noted.  She was encouraged to follow-up in 1 year.  I saw her on 11/15/21 for evaluation of  recent chest discomfort, described as "aching." First occurred December 2022. One episode occurred while sitting in church, resolved before church ended, lasted approximately 20 minutes. Another episode occurred while preparing for Christmas lunch while busy working in the kitchen. This episode also resolved on its own. States there are other times that she just feels aching in her chest. Reported increased dyspnea with exertion on occasion. Able to participate in water aerobics 3 x per week without difficulty. She denied lower extremity edema, fatigue, palpitations, melena, hematuria, hemoptysis, diaphoresis, weakness, presyncope, syncope, orthopnea, and PND. Had Covid infection x 3, with the most recent occurring 08/20/21, took molnupiravir and did not experience significant illness.  Completed radiation for breast cancer in January 2021. 2D echo on 11/22/21 revealed normal LVEF 65 to 70%, grade 2 diastolic dysfunction, normal RV, moderately dilated LA, small pericardial effusion, no significant valve disease. Coronary CTA 11/28/21 revealed coronary calcium score of 41.9, 50th percentile for age/sex, mild stenosis in RCA and LAD, normal coronary origin with right dominance.   Seen by Dr. Anne Randolph 01/23/22 for follow-up. Had recent palpitations and syncope and was admitted to the hospital, she also aspirated. At first thought it was food poisoning.  Had extensive work-up at [facility] which was unremarkable. Addition of HCTZ was discussed for diastolic dysfunction, however she decided to hold off.  43-month follow-up was recommended.  Seen in clinic by me on 09/02/22. Reassured by pulmonology visit 03/10/22.  PFTs were normal with no evidence of obstructive or restrictive respiratory defect. Shortness of breath felt to be 2/2 deconditioning and regular aerobic activity was encouraged. Continuing to feel fatigued. Sleeps approximately 10 hours nightly and also naps most days. Has not been monitoring BP at home. No chest pain, shortness of breath, lower extremity edema, palpitations, presyncope, syncope, orthopnea, and PND. Husband walks 4 miles 6 days per week. I have encouraged her to join him. BP was elevated and she advised to monitor home BP and call back to report.   She called our office 01/15/2023 to report BP consistently above goal of less than 130/80. Was advised to resume lisinopril 10 mg daily.   Seen in clinic by Dr. Anne Randolph on 03/04/23 for shortness of breath, and lightheadedness.  She was advised to DC lisinopril and start olmesartan 20 mg daily as  BP remained elevated.  Advised to start Jardiance 10 mg daily for diastolic dysfunction. Advised to return in 2-3 months for follow-up.  Today, she is here alone for follow-up. Reports she is feeling better, feels like Susan Randolph has been a "miracle  drug."  Is having less shortness of breath. Remains active with helping to care for her 58 year old mother who lives next door, water aerobics a few times a week, and walks on occasion for exercise. Admits she has not been as active lately due [place]. Was cleared for biopsy of lesion on soft/hard palate but has not had procedure. Is considering out of pocket cost. We discussed elevated triglycerides.  Admits she could improve diet somewhat. Suffers from chronic constipation. No chest pain, palpitations, presyncope, syncope, orthopnea, PND, or edema.    Past Medical History:  Diagnosis Date   Anxiety    Arthritis    Breast cancer (HCC) 06/2019   left IMC   Cancer (HCC)    basal cell carcinoma   GERD (gastroesophageal reflux disease)    History of radiation therapy 10/24/19- 11/18/19   Left Breast 15 fractions of 2.67 Gy to total 40.05 Gy. Left breast boost 5 fractions of 2 Gy to total 10 Gy   Personal history of radiation therapy 10/2019   Rectal bleed 09/2016    Past Surgical History:  Procedure Laterality Date   BREAST BIOPSY Left 08/2019   BREAST LUMPECTOMY Left 2020   BREAST LUMPECTOMY WITH RADIOACTIVE SEED AND SENTINEL LYMPH NODE BIOPSY Left 08/31/2019   Procedure: LEFT BREAST LUMPECTOMY WITH RADIOACTIVE SEED AND LEFT AXILLARY SENTINEL LYMPH NODE BIOPSY;  Surgeon: Susan Kin, MD;  Location: [facility];  Service: General;  Laterality: Left;   EYE SURGERY     Corrected drooping eye lids bilateral   MENISCUS REPAIR Right 03/29/2019   PARTIAL KNEE ARTHROPLASTY Right 10/31/2020   Procedure: Right knee medial unicompartmental arthroplasty;  Surgeon: Ollen Gross, MD;  Location: [facility];  Service: Orthopedics;  Laterality: Right;    POSTERIOR CERVICAL FUSION/FORAMINOTOMY N/A 01/11/2018   Procedure: Posterior Cervical Fusion with lateral mass fixation - Cervical one-Cervical two with Iliac Crest bone graft;  Surgeon: Susan Sicks, MD;   Location: [facility] OR;  Service: Neurosurgery;  Laterality: N/A;   TUBAL LIGATION      Current Medications: Current Meds  Medication Sig   acetaminophen (TYLENOL) 500 MG tablet Take 1,000 mg by mouth 2 (two) times daily as needed for mild pain.   ALPRAZolam (XANAX) 0.25 MG tablet Take 0.25 mg by mouth daily as needed for anxiety.   anastrozole (ARIMIDEX) 1 MG tablet TAKE 1 TABLET BY MOUTH EVERY DAY   empagliflozin (JARDIANCE) 10 MG TABS tablet Take 1 tablet (10 mg total) by mouth daily before breakfast.   escitalopram (LEXAPRO) 10 MG tablet Take 10 mg by mouth daily.   loratadine (CLARITIN) 10 MG tablet Take 10 mg by mouth daily.   olmesartan (BENICAR) 20 MG tablet Take 1 tablet (20 mg total) by mouth daily.   pantoprazole (PROTONIX) 40 MG tablet Take by mouth.   rosuvastatin (CRESTOR) 10 MG tablet TAKE 1 TABLET BY MOUTH EVERY DAY   traZODone (DESYREL) 50 MG tablet Take 25 mg by mouth at bedtime.     Allergies:   Amitriptyline and Sulfa antibiotics   Social History   Socioeconomic History   Marital status: Married    Spouse name: Not on file   Number of children: Not on file  Years of education: Not on file   Highest education level: Not on file  Occupational History   Not on file  Tobacco Use   Smoking status: Never   Smokeless tobacco: Never  Vaping Use   Vaping Use: Never used  Substance and Sexual Activity   Alcohol use: Yes    Comment: social   Drug use: No   Sexual activity: Not on file  Other Topics Concern   Not on file  Social History Narrative   Not on file   Social Determinants of Health   Financial Resource Strain: Not on file  Food Insecurity: No Food Insecurity (10/23/2022)   Hunger Vital Sign    Worried About Running Out of Food in the Last Year: Never true    Ran Out of Food in the Last Year: Never true  Transportation Needs: No Transportation Needs (10/23/2022)   PRAPARE - Administrator, Civil Service (Medical): No    Lack of Transportation  (Non-Medical): No  Physical Activity: Insufficiently Active (02/18/2023)   Exercise Vital Sign    Days of Exercise per Week: 1 day    Minutes of Exercise per Session: 30 min  Stress: Not on file  Social Connections: Not on file     Family History: The patient's family history includes Arthritis in her father; Breast cancer in her mother; Cancer in her father and mother; Colon cancer in her maternal uncle; Hypertension in her father and mother; Rheum arthritis in her maternal uncle.  ROS:   Please see the history of present illness. All other systems reviewed and are negative.   Labs/Other Studies Reviewed:    The following studies were reviewed today:  CCTA 11/28/21  IMPRESSION: 1. Minimal mixed non-obstructive CAD, CADRADS = 1.   2. Coronary calcium score of 41.9. This was 50th percentile for age and sex matched control.   3. Normal coronary origin with right dominance.   4. Consider non-coronary causes of chest pain.   Echo 11/22/21  1. Left ventricular ejection fraction, by estimation, is 65 to 70%. The  left ventricle has normal function. The left ventricle has no regional  wall motion abnormalities. Left ventricular diastolic parameters are  consistent with Grade II diastolic  dysfunction (pseudonormalization).   2. Right ventricular systolic function is normal. The right ventricular  size is normal. There is normal pulmonary artery systolic pressure.   3. Left atrial size was moderately dilated.   4. Right atrial size was mildly dilated.   5. A small pericardial effusion is present. There is no evidence of  cardiac tamponade.   6. The mitral valve is normal in structure. Trivial mitral valve  regurgitation. No evidence of mitral stenosis.   7. The aortic valve is tricuspid. Aortic valve regurgitation is not  visualized. No aortic stenosis is present.   8. The inferior vena cava is normal in size with greater than 50%  respiratory variability, suggesting right atrial  pressure of 3 mmHg.   Comparison(s): No prior Echocardiogram.    CT abdomen 10/18   IMPRESSION: 1.  No acute process or explanation for hematuria. 2.  Aortic Atherosclerosis (ICD10-I70.0). 3. Prominent gonadal veins, as can be seen with pelvic congestion syndrome.    Recent Labs: 11/05/2022: Hemoglobin 12.6; Platelet Count 168 11/17/2022: ALT 27; BUN 20; Creatinine 0.79; Potassium 4.3; Sodium 138  Recent Lipid Panel    Component Value Date/Time   CHOL 135 12/26/2020 0842   TRIG 125 12/26/2020 0842   HDL 50 12/26/2020  0842   CHOLHDL 2.7 12/26/2020 0842   LDLCALC 63 12/26/2020 0842     Risk Assessment/Calculations:      Physical Exam:    VS:  BP 130/70   Pulse 65   Ht 5\' 4"  (1.626 m)   Wt 145 lb 12.8 oz (66.1 kg)   SpO2 95%   BMI 25.03 kg/m     Wt Readings from Last 3 Encounters:  04/20/23 145 lb 12.8 oz (66.1 kg)  03/04/23 149 lb (67.6 kg)  11/05/22 146 lb 12.8 oz (66.6 kg)     GEN:  Well nourished, well developed in no acute distress HEENT: Normal NECK: No JVD; No carotid bruits CARDIAC: RRR, no murmurs, rubs, gallops RESPIRATORY:  Clear to auscultation without rales, wheezing or rhonchi  ABDOMEN: Soft, non-tender, non-distended MUSCULOSKELETAL:  No edema; No deformity. 2+ pedal pulses, equal bilaterally SKIN: Warm and dry NEUROLOGIC:  Alert and oriented x 3 PSYCHIATRIC:  Normal affect   EKG:   EKG Interpretation Date/Time:  Monday April 20 2023 10:45:12 EDT Ventricular Rate:  63 PR Interval:  168 QRS Duration:  72 QT Interval:  390 QTC Calculation: 399 R Axis:   35  Text Interpretation: Normal sinus rhythm Normal ECG When compared with ECG of 25-Aug-2019 12:37, No significant change was found Confirmed by Eligha Bridegroom [phone]) on 04/20/2023 10:52:46 AM    Diagnoses:    1. Coronary artery disease involving native coronary artery of native heart without angina pectoris   2. Chronic heart failure with preserved ejection fraction (HFpEF) (HCC)    3. Mixed hyperlipidemia   4. Essential hypertension      Assessment and Plan:     CAD without angina:  Mild nonobstructive CAD by CCTA 11/2021. She is feeling well and denies chest pain, dyspnea, or other symptoms concerning for angina. No indication for further ischemic evaluation at this time. Encouraged 150 minutes of moderate intensity exercise each week and  heart healthy, mostly plant based diet, avoiding high sugar and processed foods.  Continue rosuvastatin, olmesartan, Jardiance.  Chronic HFpEF: LVEF 65 to 70%, grade 2 diastolic dysfunction on echo 11/2021. Feeling improvement in DOE since starting olmesartan and Jardiance. She denies edema, orthopnea, or PND. Appears euvolemic on exam. Continue to work on achieving 150 minutes moderate intensity exercise each week and low-sodium, mostly plant-based diet.  Mixed hyperlipidemia : LDL 61, trigs 330 on 02/26/23. Her mother has triglycerides > 500. Lengthy discussion about lowering triglycerides with medication as well as with diet and physical activity. Encouraged at least 10 minutes of walking after meals. She does not want to start another medication at this time.  Continue rosuvastatin.  Essential hypertension: BP is well controlled. She is pleased with how she feels on current medical therapy.   Disposition: 6 months with Dr. Anne Randolph

## 2023-04-20 ENCOUNTER — Ambulatory Visit: Payer: Medicare PPO | Attending: Nurse Practitioner | Admitting: Nurse Practitioner

## 2023-04-20 ENCOUNTER — Encounter: Payer: Self-pay | Admitting: Nurse Practitioner

## 2023-04-20 VITALS — BP 130/70 | HR 65 | Ht 64.0 in | Wt 145.8 lb

## 2023-04-20 DIAGNOSIS — I10 Essential (primary) hypertension: Secondary | ICD-10-CM

## 2023-04-20 DIAGNOSIS — E782 Mixed hyperlipidemia: Secondary | ICD-10-CM | POA: Diagnosis not present

## 2023-04-20 DIAGNOSIS — I2510 Atherosclerotic heart disease of native coronary artery without angina pectoris: Secondary | ICD-10-CM

## 2023-04-20 DIAGNOSIS — I5032 Chronic diastolic (congestive) heart failure: Secondary | ICD-10-CM | POA: Diagnosis not present

## 2023-04-20 NOTE — Patient Instructions (Signed)
Medication Instructions:   Your physician recommends that you continue on your current medications as directed. Please refer to the Current Medication list given to you today.   *If you need a refill on your cardiac medications before your next appointment, please call your pharmacy*   Lab Work:  None ordered.  If you have labs (blood work) drawn today and your tests are completely normal, you will receive your results only by: MyChart Message (if you have MyChart) OR A paper copy in the mail If you have any lab test that is abnormal or we need to change your treatment, we will call you to review the results.   Testing/Procedures:  None ordered.   Follow-Up: At [facility], you and your health needs are our priority.  As part of our continuing mission to provide you with exceptional heart care, we have created designated Provider Care Teams.  These Care Teams include your primary Cardiologist (physician) and Advanced Practice Providers (APPs -  Physician Assistants and Nurse Practitioners) who all work together to provide you with the care you need, when you need it.  We recommend signing up for the patient portal called "MyChart".  Sign up information is provided on this After Visit Summary.  MyChart is used to connect with patients for Virtual Visits (Telemedicine).  Patients are able to view lab/test results, encounter notes, upcoming appointments, etc.  Non-urgent messages can be sent to your provider as well.   To learn more about what you can do with MyChart, go to [email or web address].    Your next appointment:   6 month(s)  Provider:   Mark Skains, MD     

## 2023-05-04 ENCOUNTER — Ambulatory Visit
Admission: RE | Admit: 2023-05-04 | Discharge: 2023-05-04 | Disposition: A | Discharge status: Home / Self Care | Payer: Medicare PPO | Source: Ambulatory Visit | Attending: Hematology and Oncology | Admitting: Hematology and Oncology

## 2023-05-04 DIAGNOSIS — M1990 Unspecified osteoarthritis, unspecified site: Secondary | ICD-10-CM | POA: Diagnosis not present

## 2023-05-04 DIAGNOSIS — M8588 Other specified disorders of bone density and structure, other site: Secondary | ICD-10-CM | POA: Diagnosis not present

## 2023-05-04 DIAGNOSIS — C50412 Malignant neoplasm of upper-outer quadrant of left female breast: Secondary | ICD-10-CM

## 2023-05-04 DIAGNOSIS — E349 Endocrine disorder, unspecified: Secondary | ICD-10-CM | POA: Diagnosis not present

## 2023-05-04 DIAGNOSIS — N958 Other specified menopausal and perimenopausal disorders: Secondary | ICD-10-CM | POA: Diagnosis not present

## 2023-06-10 DIAGNOSIS — H35371 Puckering of macula, right eye: Secondary | ICD-10-CM | POA: Diagnosis not present

## 2023-06-10 DIAGNOSIS — H401131 Primary open-angle glaucoma, bilateral, mild stage: Secondary | ICD-10-CM | POA: Diagnosis not present

## 2023-06-10 DIAGNOSIS — Z961 Presence of intraocular lens: Secondary | ICD-10-CM | POA: Diagnosis not present

## 2023-07-02 DIAGNOSIS — J309 Allergic rhinitis, unspecified: Secondary | ICD-10-CM | POA: Diagnosis not present

## 2023-07-02 DIAGNOSIS — Z20822 Contact with and (suspected) exposure to covid-19: Secondary | ICD-10-CM | POA: Diagnosis not present

## 2023-07-02 DIAGNOSIS — J029 Acute pharyngitis, unspecified: Secondary | ICD-10-CM | POA: Diagnosis not present

## 2023-07-30 DIAGNOSIS — Z08 Encounter for follow-up examination after completed treatment for malignant neoplasm: Secondary | ICD-10-CM | POA: Diagnosis not present

## 2023-07-30 DIAGNOSIS — L538 Other specified erythematous conditions: Secondary | ICD-10-CM | POA: Diagnosis not present

## 2023-07-30 DIAGNOSIS — Z85828 Personal history of other malignant neoplasm of skin: Secondary | ICD-10-CM | POA: Diagnosis not present

## 2023-07-30 DIAGNOSIS — L814 Other melanin hyperpigmentation: Secondary | ICD-10-CM | POA: Diagnosis not present

## 2023-07-30 DIAGNOSIS — L2989 Other pruritus: Secondary | ICD-10-CM | POA: Diagnosis not present

## 2023-07-30 DIAGNOSIS — L720 Epidermal cyst: Secondary | ICD-10-CM | POA: Diagnosis not present

## 2023-07-30 DIAGNOSIS — D225 Melanocytic nevi of trunk: Secondary | ICD-10-CM | POA: Diagnosis not present

## 2023-07-30 DIAGNOSIS — L821 Other seborrheic keratosis: Secondary | ICD-10-CM | POA: Diagnosis not present

## 2023-07-30 DIAGNOSIS — L820 Inflamed seborrheic keratosis: Secondary | ICD-10-CM | POA: Diagnosis not present

## 2023-07-31 ENCOUNTER — Other Ambulatory Visit: Payer: Self-pay | Admitting: Oral Surgery

## 2023-07-31 DIAGNOSIS — D1039 Benign neoplasm of other parts of mouth: Secondary | ICD-10-CM | POA: Diagnosis not present

## 2023-08-03 LAB — SURGICAL PATHOLOGY

## 2023-09-21 DIAGNOSIS — M47896 Other spondylosis, lumbar region: Secondary | ICD-10-CM | POA: Diagnosis not present

## 2023-09-21 DIAGNOSIS — G8929 Other chronic pain: Secondary | ICD-10-CM | POA: Diagnosis not present

## 2023-09-21 DIAGNOSIS — M1711 Unilateral primary osteoarthritis, right knee: Secondary | ICD-10-CM | POA: Diagnosis not present

## 2023-09-21 DIAGNOSIS — Z96651 Presence of right artificial knee joint: Secondary | ICD-10-CM | POA: Diagnosis not present

## 2023-09-24 DIAGNOSIS — M545 Low back pain, unspecified: Secondary | ICD-10-CM | POA: Diagnosis not present

## 2023-09-24 DIAGNOSIS — G8929 Other chronic pain: Secondary | ICD-10-CM | POA: Diagnosis not present

## 2023-09-24 DIAGNOSIS — I10 Essential (primary) hypertension: Secondary | ICD-10-CM | POA: Diagnosis not present

## 2023-09-24 DIAGNOSIS — I5032 Chronic diastolic (congestive) heart failure: Secondary | ICD-10-CM | POA: Diagnosis not present

## 2023-09-24 DIAGNOSIS — M8589 Other specified disorders of bone density and structure, multiple sites: Secondary | ICD-10-CM | POA: Diagnosis not present

## 2023-09-24 DIAGNOSIS — I110 Hypertensive heart disease with heart failure: Secondary | ICD-10-CM | POA: Diagnosis not present

## 2023-09-24 DIAGNOSIS — Z79899 Other long term (current) drug therapy: Secondary | ICD-10-CM | POA: Diagnosis not present

## 2023-09-24 DIAGNOSIS — I2510 Atherosclerotic heart disease of native coronary artery without angina pectoris: Secondary | ICD-10-CM | POA: Diagnosis not present

## 2023-09-24 DIAGNOSIS — C50912 Malignant neoplasm of unspecified site of left female breast: Secondary | ICD-10-CM | POA: Diagnosis not present

## 2023-09-25 ENCOUNTER — Ambulatory Visit
Admission: RE | Admit: 2023-09-25 | Discharge: 2023-09-25 | Disposition: A | Discharge status: Home / Self Care | Payer: Medicare PPO | Source: Ambulatory Visit | Attending: Hematology and Oncology

## 2023-09-25 DIAGNOSIS — Z170 Estrogen receptor positive status [ER+]: Secondary | ICD-10-CM | POA: Diagnosis not present

## 2023-09-25 DIAGNOSIS — Z853 Personal history of malignant neoplasm of breast: Secondary | ICD-10-CM | POA: Diagnosis not present

## 2023-09-25 DIAGNOSIS — C50412 Malignant neoplasm of upper-outer quadrant of left female breast: Secondary | ICD-10-CM | POA: Diagnosis not present

## 2023-10-15 ENCOUNTER — Other Ambulatory Visit: Payer: Self-pay | Admitting: Cardiology

## 2023-10-16 ENCOUNTER — Encounter: Payer: Self-pay | Admitting: Cardiology

## 2023-10-16 ENCOUNTER — Ambulatory Visit: Payer: Medicare PPO | Attending: Cardiology | Admitting: Cardiology

## 2023-10-16 VITALS — BP 126/78 | HR 65 | Ht 64.0 in | Wt 151.6 lb

## 2023-10-16 DIAGNOSIS — I2510 Atherosclerotic heart disease of native coronary artery without angina pectoris: Secondary | ICD-10-CM | POA: Diagnosis not present

## 2023-10-16 DIAGNOSIS — E782 Mixed hyperlipidemia: Secondary | ICD-10-CM

## 2023-10-16 NOTE — Patient Instructions (Signed)
Medication Instructions:  The current medical regimen is effective;  continue present plan and medications.  *If you need a refill on your cardiac medications before your next appointment, please call your pharmacy*  Follow-Up: At [facility], you and your health needs are our priority.  As part of our continuing mission to provide you with exceptional heart care, we have created designated Provider Care Teams.  These Care Teams include your primary Cardiologist (physician) and Advanced Practice Providers (APPs -  Physician Assistants and Nurse Practitioners) who all work together to provide you with the care you need, when you need it.  We recommend signing up for the patient portal called "MyChart".  Sign up information is provided on this After Visit Summary.  MyChart is used to connect with patients for Virtual Visits (Telemedicine).  Patients are able to view lab/test results, encounter notes, upcoming appointments, etc.  Non-urgent messages can be sent to your provider as well.   To learn more about what you can do with MyChart, go to [email or web address].    Your next appointment:   Follow up as needed with Dr Skains   

## 2023-10-16 NOTE — Progress Notes (Signed)
Cardiology Office Note:  .   Date:  10/16/2023  ID:  Susan Randolph, DOB 01/12/1946, MRN [id number] PCP: Susan Aspen, MD  Susan Randolph Providers Cardiologist:  Susan Schultz, MD     History of Present Illness: [name] is a 77 y.o. female Discussed with the use of AI scribe   History of Present Illness   The patient, a 77 year old with a history of diastolic dysfunction and coronary artery disease, presents for a follow-up visit. She was previously diagnosed with an ejection fraction of 65-70% and grade 2 diastolic dysfunction. A CT coronary performed showed a calcium score of 42, indicating minimal mixed nonobstructive coronary. The patient was subsequently started on Crestor 10mg  daily for plaque stabilization and also takes krill oil. Her LDL was 61 as of her last check.  The patient had a prior episode of vasovagal syncope at [facility], where she felt hot, flushed, and faint. She was observed overnight and an extensive workup was unremarkable. Adequate hydration was recommended. She has also experienced shortness of breath in the past and has seen a pulmonologist.  The patient is currently on Jardiance 10mg  for chronic diastolic dysfunction and olmesartan 20mg  daily. She reports feeling better within two to three days of starting Jardiance. She also reports that her blood pressure has been well-controlled on olmesartan.  The patient is also on rosuvastatin for plaque stabilization. She has been adhering to the prescribed regimen and reports feeling better. She has been under the care of Dr. Orson Randolph for primary care.          Studies Reviewed: .        Results   LABS LDL: 61 mg/dL (19/14/7829) Creatinine: 0.79 mg/dL (56/21/3086)  RADIOLOGY CT coronary: Calcium score 42, 50th percentile, minimal mixed nonobstructive coronary (11/28/2021)  DIAGNOSTIC Echocardiogram: Ejection fraction 65-70%, grade 2 diastolic dysfunction (11/22/2021)      Risk Assessment/Calculations:            Physical Exam:   VS:  BP 126/78   Pulse 65   Ht 5\' 4"  (1.626 m)   Wt 151 lb 9.6 oz (68.8 kg)   SpO2 97%   BMI 26.02 kg/m    Wt Readings from Last 3 Encounters:  10/16/23 151 lb 9.6 oz (68.8 kg)  04/20/23 145 lb 12.8 oz (66.1 kg)  03/04/23 149 lb (67.6 kg)    GEN: Well nourished, well developed in no acute distress NECK: No JVD; No carotid bruits CARDIAC: RRR, no murmurs, no rubs, no gallops RESPIRATORY:  Clear to auscultation without rales, wheezing or rhonchi  ABDOMEN: Soft, non-tender, non-distended EXTREMITIES:  No edema; No deformity   ASSESSMENT AND PLAN: .    Assessment and Plan    Diastolic Dysfunction Chronic diastolic dysfunction with echocardiogram on 11/22/2021 showing ejection fraction of 65-70% and grade 2 diastolic dysfunction. Symptoms of shortness of breath improved with Jardiance, likely due to its mild diuretic effect. Currently on Jardiance 10 mg daily and olmesartan 20 mg daily. NYHA class I. Discussed potential long-term use of Jardiance and future discontinuation to assess symptoms. - Continue Jardiance 10 mg daily - Continue olmesartan 20 mg daily - Encourage adherence to Mediterranean diet - Encourage 150 minutes of exercise per week   Coronary Artery Disease CT coronary on 11/28/2021 showed calcium score of 42 (50th percentile) and minimal mixed nonobstructive coronary plaque. On Crestor 10 mg daily for plaque stabilization. LDL was 61 on 02/26/2023. Discussed importance of maintaining LDL levels and  role of Crestor in plaque stabilization. - Continue Crestor 10 mg daily - Continue krill oil - Encourage adherence to Mediterranean diet - Encourage 150 minutes of exercise per week  Vasovagal Syncope Previous episode of vasovagal syncope at [facility] with symptoms of feeling hot, flushed, and faint. Extensive workup was unremarkable. Adequate hydration recommended. - Encourage adequate  hydration  Osteopenia/Osteoporosis Bone density test showed progression from osteopenia towards osteoporosis. Advised to perform weight-bearing exercises for at least 30 minutes a day. Discussed potential need for medication if exercise does not improve bone density. - Encourage weight-bearing exercises for at least 30 minutes a day - Follow up with cancer doctor in two weeks  General Health Maintenance Encouraged to maintain a healthy lifestyle through diet and exercise. - Encourage adherence to Mediterranean diet - Encourage 150 minutes of exercise per week - Encourage weight-bearing exercises for at least 30 minutes a day  Follow-up - PRN follow-up with cardiology - Dr. Orson Randolph to manage current medications and refills.      Will see husband in April.          Signed, Susan Schultz, MD

## 2023-10-19 DIAGNOSIS — H401131 Primary open-angle glaucoma, bilateral, mild stage: Secondary | ICD-10-CM | POA: Diagnosis not present

## 2023-10-19 DIAGNOSIS — Z961 Presence of intraocular lens: Secondary | ICD-10-CM | POA: Diagnosis not present

## 2023-10-19 DIAGNOSIS — H35371 Puckering of macula, right eye: Secondary | ICD-10-CM | POA: Diagnosis not present

## 2023-11-05 ENCOUNTER — Other Ambulatory Visit: Payer: Self-pay | Admitting: *Deleted

## 2023-11-05 DIAGNOSIS — L7211 Pilar cyst: Secondary | ICD-10-CM | POA: Diagnosis not present

## 2023-11-05 DIAGNOSIS — L820 Inflamed seborrheic keratosis: Secondary | ICD-10-CM | POA: Diagnosis not present

## 2023-11-05 DIAGNOSIS — C50412 Malignant neoplasm of upper-outer quadrant of left female breast: Secondary | ICD-10-CM

## 2023-11-05 DIAGNOSIS — L538 Other specified erythematous conditions: Secondary | ICD-10-CM | POA: Diagnosis not present

## 2023-11-05 DIAGNOSIS — L2989 Other pruritus: Secondary | ICD-10-CM | POA: Diagnosis not present

## 2023-11-05 DIAGNOSIS — L728 Other follicular cysts of the skin and subcutaneous tissue: Secondary | ICD-10-CM | POA: Diagnosis not present

## 2023-11-05 DIAGNOSIS — R208 Other disturbances of skin sensation: Secondary | ICD-10-CM | POA: Diagnosis not present

## 2023-11-05 DIAGNOSIS — Z789 Other specified health status: Secondary | ICD-10-CM | POA: Diagnosis not present

## 2023-11-06 ENCOUNTER — Inpatient Hospital Stay: Payer: Medicare PPO | Attending: Hematology and Oncology | Admitting: Hematology and Oncology

## 2023-11-06 ENCOUNTER — Inpatient Hospital Stay: Payer: Medicare PPO

## 2023-11-06 VITALS — BP 136/79 | HR 64 | Temp 97.2°F | Resp 17 | Wt 153.2 lb

## 2023-11-06 DIAGNOSIS — C50812 Malignant neoplasm of overlapping sites of left female breast: Secondary | ICD-10-CM | POA: Insufficient documentation

## 2023-11-06 DIAGNOSIS — C50412 Malignant neoplasm of upper-outer quadrant of left female breast: Secondary | ICD-10-CM

## 2023-11-06 DIAGNOSIS — Z170 Estrogen receptor positive status [ER+]: Secondary | ICD-10-CM | POA: Diagnosis not present

## 2023-11-06 DIAGNOSIS — Z79811 Long term (current) use of aromatase inhibitors: Secondary | ICD-10-CM | POA: Diagnosis not present

## 2023-11-06 LAB — CBC WITH DIFFERENTIAL (CANCER CENTER ONLY)
Abs Immature Granulocytes: 0.01 10*3/uL (ref 0.00–0.07)
Basophils Absolute: 0 10*3/uL (ref 0.0–0.1)
Basophils Relative: 1 %
Eosinophils Absolute: 0.1 10*3/uL (ref 0.0–0.5)
Eosinophils Relative: 3 %
HCT: 40.8 % (ref 36.0–46.0)
Hemoglobin: 13.9 g/dL (ref 12.0–15.0)
Immature Granulocytes: 0 %
Lymphocytes Relative: 29 %
Lymphs Abs: 1.4 10*3/uL (ref 0.7–4.0)
MCH: 30.5 pg (ref 26.0–34.0)
MCHC: 34.1 g/dL (ref 30.0–36.0)
MCV: 89.5 fL (ref 80.0–100.0)
Monocytes Absolute: 0.3 10*3/uL (ref 0.1–1.0)
Monocytes Relative: 7 %
Neutro Abs: 2.8 10*3/uL (ref 1.7–7.7)
Neutrophils Relative %: 60 %
Platelet Count: 172 10*3/uL (ref 150–400)
RBC: 4.56 MIL/uL (ref 3.87–5.11)
RDW: 12.2 % (ref 11.5–15.5)
WBC Count: 4.7 10*3/uL (ref 4.0–10.5)
nRBC: 0 % (ref 0.0–0.2)

## 2023-11-06 LAB — CMP (CANCER CENTER ONLY)
ALT: 39 U/L (ref 0–44)
AST: 29 U/L (ref 15–41)
Albumin: 4.4 g/dL (ref 3.5–5.0)
Alkaline Phosphatase: 67 U/L (ref 38–126)
Anion gap: 3 — ABNORMAL LOW (ref 5–15)
BUN: 24 mg/dL — ABNORMAL HIGH (ref 8–23)
CO2: 28 mmol/L (ref 22–32)
Calcium: 10.4 mg/dL — ABNORMAL HIGH (ref 8.9–10.3)
Chloride: 107 mmol/L (ref 98–111)
Creatinine: 0.79 mg/dL (ref 0.44–1.00)
GFR, Estimated: >60 mL/min (ref >60)
Glucose, Bld: 96 mg/dL (ref 70–99)
Potassium: 4 mmol/L (ref 3.5–5.1)
Sodium: 138 mmol/L (ref 135–145)
Total Bilirubin: 0.4 mg/dL (ref 0.0–1.2)
Total Protein: 6.9 g/dL (ref 6.5–8.1)

## 2023-11-06 NOTE — Progress Notes (Signed)
[facility]  [phone] [phone]     ID: Susan Randolph DOB: 1946-09-18  MR#: [id number]  KGM#:[id number]  Patient Care Team: Emilio Aspen, MD as PCP - General (Internal Medicine) Jake Bathe, MD as PCP - Cardiology (Cardiology) Pershing Proud, RN as Oncology Nurse Navigator Donnelly Angelica, RN as Oncology Nurse Navigator Ovidio Kin, MD as Consulting Physician (General Surgery) Magrinat, Valentino Hue, MD (Inactive) as Consulting Physician (Oncology) Lonie Peak, MD as Attending Physician (Radiation Oncology) Ollen Gross, MD as Consulting Physician (Orthopedic Surgery) Julio Sicks, MD as Consulting Physician (Neurosurgery) Gerald Leitz, MD as Consulting Physician (Obstetrics and Gynecology) Vida Rigger, MD as Consulting Physician (Gastroenterology) Marden Noble, MD (Inactive) (Internal Medicine) Marden Noble, MD (Inactive) (Internal Medicine) Rachel Moulds, MD  CHIEF COMPLAINT: estrogen receptor positive breast cancer  CURRENT TREATMENT: anastrozole  INTERVAL HISTORY: Discussed the use of AI scribe software for clinical note transcription with the patient, who gave verbal consent to proceed.  History of Present Illness    Susan Randolph returns today for follow up of her estrogen receptor positive breast cancer.   The patient, with a history of breast cancer and arthritis, presents with worsening arthralgias, a new skin rash, and concerns about osteopenia. She has been on anastrozole since March 2021 for breast cancer and has noticed an increase in joint pain, which she attributes to the medication. She has a history of arthritis, but the pain seems to be spreading to new areas. She manages the pain with diclofenac, taken once daily as needed.  The patient also reports a new skin rash on her back and abdomen, which is itchy and has been worsening. She has not made any changes to her diet, clothing, or detergents, and she has been  using Eucerin cream for relief. She denies any new medications.  The patient's osteopenia has worsened by approx 30% based on her last bone density test. She admits to not exercising regularly and is considering medication for bone density strengthening. She also reports a history of cervical fusion (C1, C2) in 2019, prior to her breast cancer diagnosis.  Rest of the pertinent 10 point ROS reviewed and negative  REVIEW OF SYSTEMS:  COVID 19 VACCINATION STATUS: Pfizer x2; infection 02/2021   HISTORY OF CURRENT ILLNESS: From the original intake note:  Susan Randolph had routine screening mammography on 08/02/2019 showing a possible abnormality in the left breast. She underwent left diagnostic mammography with tomography and left breast ultrasonography at [facility] on 08/08/2019 showing: breast density category B; 6 mm mass in the left breast at 12 o'clock; no enlarged or abnormal left axillary lymph nodes.  Accordingly on 08/09/2019 she proceeded to biopsy of the left breast area in question. The pathology from this procedure ([id number]) showed: invasive mammary carcinoma, grade 2, e-cadherin positive. Prognostic indicators significant for: estrogen receptor, 100% positive and progesterone receptor, 95% positive, both with strong staining intensity. Proliferation marker Ki67 at 2%. HER2 equivocal by immunohistochemistry (2+), but negative by fluorescent in situ hybridization with a signals ratio 1.19 and number per cell 1.85.  The patient's subsequent history is as detailed below.   PAST MEDICAL HISTORY: Past Medical History:  Diagnosis Date   Anxiety    Arthritis    Breast cancer (HCC) 06/2019   left IMC   Cancer (HCC)    basal cell carcinoma   GERD (gastroesophageal reflux disease)    History of radiation therapy 10/24/19- 11/18/19   Left Breast 15 fractions of 2.67  Gy to total 40.05 Gy. Left breast boost 5 fractions of 2 Gy to total 10 Gy   Personal history of radiation  therapy 10/2019   Rectal bleed 09/2016    PAST SURGICAL HISTORY: Past Surgical History:  Procedure Laterality Date   BREAST BIOPSY Left 08/2019   BREAST LUMPECTOMY Left 2020   BREAST LUMPECTOMY WITH RADIOACTIVE SEED AND SENTINEL LYMPH NODE BIOPSY Left 08/31/2019   Procedure: LEFT BREAST LUMPECTOMY WITH RADIOACTIVE SEED AND LEFT AXILLARY SENTINEL LYMPH NODE BIOPSY;  Surgeon: Ovidio Kin, MD;  Location:  SURGERY CENTER;  Service: General;  Laterality: Left;   EYE SURGERY     Corrected drooping eye lids bilateral   MENISCUS REPAIR Right 03/29/2019   PARTIAL KNEE ARTHROPLASTY Right 10/31/2020   Procedure: Right knee medial unicompartmental arthroplasty;  Surgeon: Ollen Gross, MD;  Location: [facility];  Service: Orthopedics;  Laterality: Right;    POSTERIOR CERVICAL FUSION/FORAMINOTOMY N/A 01/11/2018   Procedure: Posterior Cervical Fusion with lateral mass fixation - Cervical one-Cervical two with Iliac Crest bone graft;  Surgeon: Julio Sicks, MD;  Location: [facility] OR;  Service: Neurosurgery;  Laterality: N/A;   TUBAL LIGATION      FAMILY HISTORY: Family History  Problem Relation Age of Onset   Cancer Mother        breast cancer/endometrial cancer   Hypertension Mother    Breast cancer Mother    Hypertension Father    Cancer Father        Glioblastoma   Arthritis Father    Rheum arthritis Maternal Uncle    Colon cancer Maternal Uncle   Patient's father was 83 years old when he died from glioblastoma. Patient's mother is 14 years old as of 07/2019.  She was diagnosed with breast cancer at age 2 and with endometrial cancer at age 24. The patient denies a family hx of ovarian cancer. She has no siblings. She also notes a maternal uncle with colon cancer in his 27s.   GYNECOLOGIC HISTORY:  No LMP recorded. Patient is postmenopausal. Menarche: 78 years old Age at first live birth: 78 years old GX P 3 LMP age 19 Contraceptive yes, unsure how long, no issues HRT yes,  used for 5 years  Hysterectomy? no BSO? 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  Husband Susan Randolph") is a retired Personnel officer. She lives at home with Susan Randolph. Daughter Susan Randolph, age 35, is a homemaker in [place]. Daughter Susan Randolph, age 4, is a Engineer, civil (consulting) at the [employer] hospital in [place] [facility] [place] [facility] [place]. The patient lost her son Susan Randolph to a car accident in 2015.  The patient has 3 grandchildren.  She attends [employer].    ADVANCED DIRECTIVES: In the absence of any documentation to the contrary, the patient's spouse is her HCPOA.   HEALTH MAINTENANCE: Social History   Tobacco Use   Smoking status: Never   Smokeless tobacco: Never  Vaping Use   Vaping status: Never Used  Substance Use Topics   Alcohol use: Yes    Comment: social   Drug use: No     Colonoscopy: 2014  PAP: 03/2017, negative  Bone density: scheduled 08/2019   Allergies  Allergen Reactions   Amitriptyline Other (See Comments)    tremors   Sulfa Antibiotics Other (See Comments)    Doesn't remember, happened in childhood    Current Outpatient Medications  Medication Sig Dispense Refill   acetaminophen (TYLENOL) 500 MG  tablet Take 1,000 mg by mouth 2 (two) times daily as needed for mild pain.     ALPRAZolam (XANAX) 0.25 MG tablet Take 0.25 mg by mouth daily as needed for anxiety.     anastrozole (ARIMIDEX) 1 MG tablet TAKE 1 TABLET BY MOUTH EVERY DAY 90 tablet 3   Apoaequorin (PREVAGEN) 10 MG CAPS as directed Orally     Diclofenac 35 MG CAPS 1 capsule as needed Orally two times a day     empagliflozin (JARDIANCE) 10 MG TABS tablet TAKE 1 TABLET BY MOUTH DAILY BEFORE BREAKFAST. 30 tablet 0   escitalopram (LEXAPRO) 10 MG tablet Take 10 mg by mouth daily.     Krill Oil (OMEGA-3) 500 MG CAPS as directed Orally     loratadine (CLARITIN) 10 MG tablet Take 10 mg by mouth daily.     Multiple Vitamins-Minerals (MULTI VITAMIN/MINERALS) TABS as  directed Orally     olmesartan (BENICAR) 20 MG tablet Take 1 tablet (20 mg total) by mouth daily. 90 tablet 3   pantoprazole (PROTONIX) 40 MG tablet Take by mouth.     rosuvastatin (CRESTOR) 10 MG tablet TAKE 1 TABLET BY MOUTH EVERY DAY 90 tablet 3   traZODone (DESYREL) 50 MG tablet Take 25 mg by mouth at bedtime.     No current facility-administered medications for this visit.    OBJECTIVE: white woman who appears younger than stated age  Vitals:   11/06/23 1136  BP: 136/79  Pulse: 64  Resp: 17  Temp: (!) 97.2 F (36.2 C)  SpO2: 97%       Body mass index is 26.3 kg/m.   Wt Readings from Last 3 Encounters:  11/06/23 153 lb 3.2 oz (69.5 kg)  10/16/23 151 lb 9.6 oz (68.8 kg)  04/20/23 145 lb 12.8 oz (66.1 kg)      ECOG FS:1 - Symptomatic but completely ambulatory  Sclerae unicteric, EOMs intact No palpable cervical adenopathy. Breast: Bilateral breasts inspected and palpated.  Scattered density noted.  No definitive palpable masses.  No regional adenopathy. No lower extremity edema  LAB RESULTS:  CMP     Component Value Date/Time   NA 138 11/17/2022 1109   NA 139 11/15/2021 0000   K 4.3 11/17/2022 1109   CL 105 11/17/2022 1109   CO2 29 11/17/2022 1109   GLUCOSE 93 11/17/2022 1109   BUN 20 11/17/2022 1109   BUN 24 11/15/2021 0000   CREATININE 0.79 11/17/2022 1109   CALCIUM 10.2 11/17/2022 1109   PROT 6.9 11/17/2022 1109   ALBUMIN 4.3 11/17/2022 1109   AST 23 11/17/2022 1109   ALT 27 11/17/2022 1109   ALKPHOS 73 11/17/2022 1109   BILITOT 0.4 11/17/2022 1109   GFRNONAA >60 11/17/2022 1109   GFRAA >60 04/09/2020 1255   GFRAA >60 08/17/2019 0846    No results found for: "TOTALPROTELP", "ALBUMINELP", "A1GS", "A2GS", "BETS", "BETA2SER", "GAMS", "MSPIKE", "SPEI"  No results found for: "KPAFRELGTCHN", "LAMBDASER", "KAPLAMBRATIO"  Lab Results  Component Value Date   WBC 4.7 11/06/2023   NEUTROABS 2.8 11/06/2023   HGB 13.9 11/06/2023   HCT 40.8 11/06/2023    MCV 89.5 11/06/2023   PLT 172 11/06/2023   No results found for: "LABCA2"  No components found for: "VZDGLO756"  No results for input(s): "INR" in the last 168 hours.  No results found for: "LABCA2"  No results found for: "EPP295"  No results found for: "CAN125"  No results found for: "CAN153"  No results found for: "CA2729"  No components  found for: "HGQUANT"  No results found for: "CEA1", "CEA" / No results found for: "CEA1", "CEA"   No results found for: "AFPTUMOR"  No results found for: "CHROMOGRNA"  No results found for: "HGBA", "HGBA2QUANT", "HGBFQUANT", "HGBSQUAN" (Hemoglobinopathy evaluation)   No results found for: "LDH"  No results found for: "IRON", "TIBC", "IRONPCTSAT" (Iron and TIBC)  No results found for: "FERRITIN"  Urinalysis No results found for: "COLORURINE", "APPEARANCEUR", "LABSPEC", "PHURINE", "GLUCOSEU", "HGBUR", "BILIRUBINUR", "KETONESUR", "PROTEINUR", "UROBILINOGEN", "NITRITE", "LEUKOCYTESUR"   STUDIES: No results found.   ELIGIBLE FOR AVAILABLE RESEARCH PROTOCOL: no  ASSESSMENT: 78 y.o. [place] woman status post left breast upper outer quadrant biopsy 08/09/2019 for a clinical T1b N0, stage IA invasive ductal carcinoma, grade 2, estrogen and progesterone receptor positive, HER-2 not amplified, with an MIB-1 of 2%.  (1) status post left lumpectomy and sentinel lymph node sampling 08/31/2019 for a pT1a pN0, stage IA invasive ductal carcinoma, with negative margins  (2) adjuvant radiation 10/24/2019-11/18/2019 Site Technique Total Dose (Gy) Dose per Fx (Gy) Completed Fx Beam Energies  Breast, Left: Breast_Lt 3D 40.05/40.05 2.67 15/15 6X  Breast, Left: Breast_Lt_Bst 3D 10/10 2 5/5 6X   (3) started anastrozole 01/08/2020  (a) bone density 08/25/2019 shows a T score of -1.8 [facility])   PLAN:  Breast Cancer on Anastrozole Patient reports increased arthralgias, a known side effect of Anastrozole. No new side effects reported.  Anastrozole to be continued until March 2026. -Continue Anastrozole until March 2026.  Dermatological Concerns Patient reports new skin rash on back and abdomen. Not suggestive of malignancy or shingles. Possibly related to dryness or new allergen exposure. -Apply topical hydrocortisone 1% for symptomatic relief. -Consider potential allergen exposure and moisturize skin liberally.  Osteopenia progressing towards Osteoporosis Significant worsening of bone density noted on recent DEXA scan. Patient admits to lack of regular weight-bearing exercise. -Obtain dental clearance for initiation of bisphosphonate therapy (Fosamax or Boniva). -Encourage weight-bearing exercise.  General Health Maintenance -Complete blood count normal. Awaiting results of metabolic panel. -Plan for annual follow-up.  Total encounter time 30 minutes.Rachel Moulds, MD   11/06/2023 11:45 AM Medical Oncology and Hematology [facility] [address] [place], [place] [address] Tel. [phone]    Fax. [phone]  *Total Encounter Time as defined by the Centers for Medicare and Medicaid Services includes, in addition to the face-to-face time of a patient visit (documented in the note above) non-face-to-face time: obtaining and reviewing outside history, ordering and reviewing medications, tests or procedures, care coordination (communications with other health care professionals or caregivers) and documentation in the medical record.

## 2023-11-14 ENCOUNTER — Other Ambulatory Visit: Payer: Self-pay | Admitting: Cardiology

## 2023-11-19 DIAGNOSIS — M47816 Spondylosis without myelopathy or radiculopathy, lumbar region: Secondary | ICD-10-CM | POA: Diagnosis not present

## 2023-12-01 DIAGNOSIS — M533 Sacrococcygeal disorders, not elsewhere classified: Secondary | ICD-10-CM | POA: Diagnosis not present

## 2023-12-01 DIAGNOSIS — M47816 Spondylosis without myelopathy or radiculopathy, lumbar region: Secondary | ICD-10-CM | POA: Diagnosis not present

## 2023-12-02 DIAGNOSIS — M9902 Segmental and somatic dysfunction of thoracic region: Secondary | ICD-10-CM | POA: Diagnosis not present

## 2023-12-02 DIAGNOSIS — M9905 Segmental and somatic dysfunction of pelvic region: Secondary | ICD-10-CM | POA: Diagnosis not present

## 2023-12-02 DIAGNOSIS — M9901 Segmental and somatic dysfunction of cervical region: Secondary | ICD-10-CM | POA: Diagnosis not present

## 2023-12-02 DIAGNOSIS — M9903 Segmental and somatic dysfunction of lumbar region: Secondary | ICD-10-CM | POA: Diagnosis not present

## 2023-12-02 DIAGNOSIS — M47812 Spondylosis without myelopathy or radiculopathy, cervical region: Secondary | ICD-10-CM | POA: Diagnosis not present

## 2023-12-02 DIAGNOSIS — M5127 Other intervertebral disc displacement, lumbosacral region: Secondary | ICD-10-CM | POA: Diagnosis not present

## 2023-12-02 DIAGNOSIS — M5126 Other intervertebral disc displacement, lumbar region: Secondary | ICD-10-CM | POA: Diagnosis not present

## 2023-12-02 DIAGNOSIS — S29012A Strain of muscle and tendon of back wall of thorax, initial encounter: Secondary | ICD-10-CM | POA: Diagnosis not present

## 2023-12-07 DIAGNOSIS — S29012A Strain of muscle and tendon of back wall of thorax, initial encounter: Secondary | ICD-10-CM | POA: Diagnosis not present

## 2023-12-07 DIAGNOSIS — M9903 Segmental and somatic dysfunction of lumbar region: Secondary | ICD-10-CM | POA: Diagnosis not present

## 2023-12-07 DIAGNOSIS — M5127 Other intervertebral disc displacement, lumbosacral region: Secondary | ICD-10-CM | POA: Diagnosis not present

## 2023-12-07 DIAGNOSIS — M5126 Other intervertebral disc displacement, lumbar region: Secondary | ICD-10-CM | POA: Diagnosis not present

## 2023-12-07 DIAGNOSIS — M9905 Segmental and somatic dysfunction of pelvic region: Secondary | ICD-10-CM | POA: Diagnosis not present

## 2023-12-07 DIAGNOSIS — M9902 Segmental and somatic dysfunction of thoracic region: Secondary | ICD-10-CM | POA: Diagnosis not present

## 2023-12-07 DIAGNOSIS — M47812 Spondylosis without myelopathy or radiculopathy, cervical region: Secondary | ICD-10-CM | POA: Diagnosis not present

## 2023-12-07 DIAGNOSIS — M9901 Segmental and somatic dysfunction of cervical region: Secondary | ICD-10-CM | POA: Diagnosis not present

## 2023-12-10 DIAGNOSIS — M9902 Segmental and somatic dysfunction of thoracic region: Secondary | ICD-10-CM | POA: Diagnosis not present

## 2023-12-10 DIAGNOSIS — M9905 Segmental and somatic dysfunction of pelvic region: Secondary | ICD-10-CM | POA: Diagnosis not present

## 2023-12-10 DIAGNOSIS — S29012A Strain of muscle and tendon of back wall of thorax, initial encounter: Secondary | ICD-10-CM | POA: Diagnosis not present

## 2023-12-10 DIAGNOSIS — M5127 Other intervertebral disc displacement, lumbosacral region: Secondary | ICD-10-CM | POA: Diagnosis not present

## 2023-12-10 DIAGNOSIS — M9903 Segmental and somatic dysfunction of lumbar region: Secondary | ICD-10-CM | POA: Diagnosis not present

## 2023-12-10 DIAGNOSIS — M5126 Other intervertebral disc displacement, lumbar region: Secondary | ICD-10-CM | POA: Diagnosis not present

## 2023-12-10 DIAGNOSIS — M9901 Segmental and somatic dysfunction of cervical region: Secondary | ICD-10-CM | POA: Diagnosis not present

## 2023-12-10 DIAGNOSIS — M47812 Spondylosis without myelopathy or radiculopathy, cervical region: Secondary | ICD-10-CM | POA: Diagnosis not present

## 2023-12-14 DIAGNOSIS — M8580 Other specified disorders of bone density and structure, unspecified site: Secondary | ICD-10-CM | POA: Diagnosis not present

## 2023-12-14 DIAGNOSIS — Z01419 Encounter for gynecological examination (general) (routine) without abnormal findings: Secondary | ICD-10-CM | POA: Diagnosis not present

## 2023-12-16 DIAGNOSIS — M9903 Segmental and somatic dysfunction of lumbar region: Secondary | ICD-10-CM | POA: Diagnosis not present

## 2023-12-16 DIAGNOSIS — M9902 Segmental and somatic dysfunction of thoracic region: Secondary | ICD-10-CM | POA: Diagnosis not present

## 2023-12-16 DIAGNOSIS — M5127 Other intervertebral disc displacement, lumbosacral region: Secondary | ICD-10-CM | POA: Diagnosis not present

## 2023-12-16 DIAGNOSIS — S29012A Strain of muscle and tendon of back wall of thorax, initial encounter: Secondary | ICD-10-CM | POA: Diagnosis not present

## 2023-12-16 DIAGNOSIS — M9901 Segmental and somatic dysfunction of cervical region: Secondary | ICD-10-CM | POA: Diagnosis not present

## 2023-12-16 DIAGNOSIS — M9905 Segmental and somatic dysfunction of pelvic region: Secondary | ICD-10-CM | POA: Diagnosis not present

## 2023-12-16 DIAGNOSIS — M47812 Spondylosis without myelopathy or radiculopathy, cervical region: Secondary | ICD-10-CM | POA: Diagnosis not present

## 2023-12-16 DIAGNOSIS — M5126 Other intervertebral disc displacement, lumbar region: Secondary | ICD-10-CM | POA: Diagnosis not present

## 2023-12-24 DIAGNOSIS — M47812 Spondylosis without myelopathy or radiculopathy, cervical region: Secondary | ICD-10-CM | POA: Diagnosis not present

## 2023-12-24 DIAGNOSIS — M9902 Segmental and somatic dysfunction of thoracic region: Secondary | ICD-10-CM | POA: Diagnosis not present

## 2023-12-24 DIAGNOSIS — M9905 Segmental and somatic dysfunction of pelvic region: Secondary | ICD-10-CM | POA: Diagnosis not present

## 2023-12-24 DIAGNOSIS — M9901 Segmental and somatic dysfunction of cervical region: Secondary | ICD-10-CM | POA: Diagnosis not present

## 2023-12-24 DIAGNOSIS — S29012A Strain of muscle and tendon of back wall of thorax, initial encounter: Secondary | ICD-10-CM | POA: Diagnosis not present

## 2023-12-24 DIAGNOSIS — M9903 Segmental and somatic dysfunction of lumbar region: Secondary | ICD-10-CM | POA: Diagnosis not present

## 2023-12-24 DIAGNOSIS — M5126 Other intervertebral disc displacement, lumbar region: Secondary | ICD-10-CM | POA: Diagnosis not present

## 2023-12-24 DIAGNOSIS — M5127 Other intervertebral disc displacement, lumbosacral region: Secondary | ICD-10-CM | POA: Diagnosis not present

## 2023-12-28 DIAGNOSIS — M5127 Other intervertebral disc displacement, lumbosacral region: Secondary | ICD-10-CM | POA: Diagnosis not present

## 2023-12-28 DIAGNOSIS — M9902 Segmental and somatic dysfunction of thoracic region: Secondary | ICD-10-CM | POA: Diagnosis not present

## 2023-12-28 DIAGNOSIS — M9903 Segmental and somatic dysfunction of lumbar region: Secondary | ICD-10-CM | POA: Diagnosis not present

## 2023-12-28 DIAGNOSIS — M47812 Spondylosis without myelopathy or radiculopathy, cervical region: Secondary | ICD-10-CM | POA: Diagnosis not present

## 2023-12-28 DIAGNOSIS — S29012A Strain of muscle and tendon of back wall of thorax, initial encounter: Secondary | ICD-10-CM | POA: Diagnosis not present

## 2023-12-28 DIAGNOSIS — M9901 Segmental and somatic dysfunction of cervical region: Secondary | ICD-10-CM | POA: Diagnosis not present

## 2023-12-28 DIAGNOSIS — M9905 Segmental and somatic dysfunction of pelvic region: Secondary | ICD-10-CM | POA: Diagnosis not present

## 2023-12-28 DIAGNOSIS — M5126 Other intervertebral disc displacement, lumbar region: Secondary | ICD-10-CM | POA: Diagnosis not present

## 2023-12-30 DIAGNOSIS — M9903 Segmental and somatic dysfunction of lumbar region: Secondary | ICD-10-CM | POA: Diagnosis not present

## 2023-12-30 DIAGNOSIS — S29012A Strain of muscle and tendon of back wall of thorax, initial encounter: Secondary | ICD-10-CM | POA: Diagnosis not present

## 2023-12-30 DIAGNOSIS — M9905 Segmental and somatic dysfunction of pelvic region: Secondary | ICD-10-CM | POA: Diagnosis not present

## 2023-12-30 DIAGNOSIS — M9902 Segmental and somatic dysfunction of thoracic region: Secondary | ICD-10-CM | POA: Diagnosis not present

## 2023-12-30 DIAGNOSIS — M9901 Segmental and somatic dysfunction of cervical region: Secondary | ICD-10-CM | POA: Diagnosis not present

## 2023-12-30 DIAGNOSIS — M5127 Other intervertebral disc displacement, lumbosacral region: Secondary | ICD-10-CM | POA: Diagnosis not present

## 2023-12-30 DIAGNOSIS — M5126 Other intervertebral disc displacement, lumbar region: Secondary | ICD-10-CM | POA: Diagnosis not present

## 2023-12-30 DIAGNOSIS — M47812 Spondylosis without myelopathy or radiculopathy, cervical region: Secondary | ICD-10-CM | POA: Diagnosis not present

## 2024-01-05 DIAGNOSIS — M9902 Segmental and somatic dysfunction of thoracic region: Secondary | ICD-10-CM | POA: Diagnosis not present

## 2024-01-05 DIAGNOSIS — M47812 Spondylosis without myelopathy or radiculopathy, cervical region: Secondary | ICD-10-CM | POA: Diagnosis not present

## 2024-01-05 DIAGNOSIS — M9901 Segmental and somatic dysfunction of cervical region: Secondary | ICD-10-CM | POA: Diagnosis not present

## 2024-01-05 DIAGNOSIS — M5126 Other intervertebral disc displacement, lumbar region: Secondary | ICD-10-CM | POA: Diagnosis not present

## 2024-01-05 DIAGNOSIS — M5127 Other intervertebral disc displacement, lumbosacral region: Secondary | ICD-10-CM | POA: Diagnosis not present

## 2024-01-05 DIAGNOSIS — S29012A Strain of muscle and tendon of back wall of thorax, initial encounter: Secondary | ICD-10-CM | POA: Diagnosis not present

## 2024-01-05 DIAGNOSIS — M9905 Segmental and somatic dysfunction of pelvic region: Secondary | ICD-10-CM | POA: Diagnosis not present

## 2024-01-05 DIAGNOSIS — M9903 Segmental and somatic dysfunction of lumbar region: Secondary | ICD-10-CM | POA: Diagnosis not present

## 2024-01-12 DIAGNOSIS — M9901 Segmental and somatic dysfunction of cervical region: Secondary | ICD-10-CM | POA: Diagnosis not present

## 2024-01-12 DIAGNOSIS — M9903 Segmental and somatic dysfunction of lumbar region: Secondary | ICD-10-CM | POA: Diagnosis not present

## 2024-01-12 DIAGNOSIS — M9902 Segmental and somatic dysfunction of thoracic region: Secondary | ICD-10-CM | POA: Diagnosis not present

## 2024-01-12 DIAGNOSIS — M5127 Other intervertebral disc displacement, lumbosacral region: Secondary | ICD-10-CM | POA: Diagnosis not present

## 2024-01-12 DIAGNOSIS — M47812 Spondylosis without myelopathy or radiculopathy, cervical region: Secondary | ICD-10-CM | POA: Diagnosis not present

## 2024-01-12 DIAGNOSIS — M5126 Other intervertebral disc displacement, lumbar region: Secondary | ICD-10-CM | POA: Diagnosis not present

## 2024-01-12 DIAGNOSIS — S29012A Strain of muscle and tendon of back wall of thorax, initial encounter: Secondary | ICD-10-CM | POA: Diagnosis not present

## 2024-01-12 DIAGNOSIS — M9905 Segmental and somatic dysfunction of pelvic region: Secondary | ICD-10-CM | POA: Diagnosis not present

## 2024-01-19 DIAGNOSIS — M9901 Segmental and somatic dysfunction of cervical region: Secondary | ICD-10-CM | POA: Diagnosis not present

## 2024-01-19 DIAGNOSIS — M5127 Other intervertebral disc displacement, lumbosacral region: Secondary | ICD-10-CM | POA: Diagnosis not present

## 2024-01-19 DIAGNOSIS — M9902 Segmental and somatic dysfunction of thoracic region: Secondary | ICD-10-CM | POA: Diagnosis not present

## 2024-01-19 DIAGNOSIS — M5126 Other intervertebral disc displacement, lumbar region: Secondary | ICD-10-CM | POA: Diagnosis not present

## 2024-01-19 DIAGNOSIS — M9903 Segmental and somatic dysfunction of lumbar region: Secondary | ICD-10-CM | POA: Diagnosis not present

## 2024-01-19 DIAGNOSIS — M47812 Spondylosis without myelopathy or radiculopathy, cervical region: Secondary | ICD-10-CM | POA: Diagnosis not present

## 2024-01-19 DIAGNOSIS — M9905 Segmental and somatic dysfunction of pelvic region: Secondary | ICD-10-CM | POA: Diagnosis not present

## 2024-01-19 DIAGNOSIS — S29012A Strain of muscle and tendon of back wall of thorax, initial encounter: Secondary | ICD-10-CM | POA: Diagnosis not present

## 2024-01-26 DIAGNOSIS — M9903 Segmental and somatic dysfunction of lumbar region: Secondary | ICD-10-CM | POA: Diagnosis not present

## 2024-01-26 DIAGNOSIS — M9901 Segmental and somatic dysfunction of cervical region: Secondary | ICD-10-CM | POA: Diagnosis not present

## 2024-01-26 DIAGNOSIS — M5127 Other intervertebral disc displacement, lumbosacral region: Secondary | ICD-10-CM | POA: Diagnosis not present

## 2024-01-26 DIAGNOSIS — S29012A Strain of muscle and tendon of back wall of thorax, initial encounter: Secondary | ICD-10-CM | POA: Diagnosis not present

## 2024-01-26 DIAGNOSIS — M5126 Other intervertebral disc displacement, lumbar region: Secondary | ICD-10-CM | POA: Diagnosis not present

## 2024-01-26 DIAGNOSIS — M9902 Segmental and somatic dysfunction of thoracic region: Secondary | ICD-10-CM | POA: Diagnosis not present

## 2024-01-26 DIAGNOSIS — M47812 Spondylosis without myelopathy or radiculopathy, cervical region: Secondary | ICD-10-CM | POA: Diagnosis not present

## 2024-01-26 DIAGNOSIS — M9905 Segmental and somatic dysfunction of pelvic region: Secondary | ICD-10-CM | POA: Diagnosis not present

## 2024-02-08 DIAGNOSIS — M9902 Segmental and somatic dysfunction of thoracic region: Secondary | ICD-10-CM | POA: Diagnosis not present

## 2024-02-08 DIAGNOSIS — M9903 Segmental and somatic dysfunction of lumbar region: Secondary | ICD-10-CM | POA: Diagnosis not present

## 2024-02-08 DIAGNOSIS — M47812 Spondylosis without myelopathy or radiculopathy, cervical region: Secondary | ICD-10-CM | POA: Diagnosis not present

## 2024-02-08 DIAGNOSIS — M5127 Other intervertebral disc displacement, lumbosacral region: Secondary | ICD-10-CM | POA: Diagnosis not present

## 2024-02-08 DIAGNOSIS — M5126 Other intervertebral disc displacement, lumbar region: Secondary | ICD-10-CM | POA: Diagnosis not present

## 2024-02-08 DIAGNOSIS — M9905 Segmental and somatic dysfunction of pelvic region: Secondary | ICD-10-CM | POA: Diagnosis not present

## 2024-02-08 DIAGNOSIS — M9901 Segmental and somatic dysfunction of cervical region: Secondary | ICD-10-CM | POA: Diagnosis not present

## 2024-02-08 DIAGNOSIS — S29012A Strain of muscle and tendon of back wall of thorax, initial encounter: Secondary | ICD-10-CM | POA: Diagnosis not present

## 2024-02-13 ENCOUNTER — Other Ambulatory Visit: Payer: Self-pay | Admitting: Cardiology

## 2024-02-15 DIAGNOSIS — K219 Gastro-esophageal reflux disease without esophagitis: Secondary | ICD-10-CM | POA: Diagnosis not present

## 2024-02-15 DIAGNOSIS — K5901 Slow transit constipation: Secondary | ICD-10-CM | POA: Diagnosis not present

## 2024-02-19 ENCOUNTER — Other Ambulatory Visit: Payer: Self-pay | Admitting: Cardiology

## 2024-02-27 ENCOUNTER — Other Ambulatory Visit: Payer: Self-pay | Admitting: Hematology and Oncology

## 2024-03-01 DIAGNOSIS — M47812 Spondylosis without myelopathy or radiculopathy, cervical region: Secondary | ICD-10-CM | POA: Diagnosis not present

## 2024-03-01 DIAGNOSIS — M9903 Segmental and somatic dysfunction of lumbar region: Secondary | ICD-10-CM | POA: Diagnosis not present

## 2024-03-01 DIAGNOSIS — M9901 Segmental and somatic dysfunction of cervical region: Secondary | ICD-10-CM | POA: Diagnosis not present

## 2024-03-01 DIAGNOSIS — S29012A Strain of muscle and tendon of back wall of thorax, initial encounter: Secondary | ICD-10-CM | POA: Diagnosis not present

## 2024-03-01 DIAGNOSIS — M5127 Other intervertebral disc displacement, lumbosacral region: Secondary | ICD-10-CM | POA: Diagnosis not present

## 2024-03-01 DIAGNOSIS — H35371 Puckering of macula, right eye: Secondary | ICD-10-CM | POA: Diagnosis not present

## 2024-03-01 DIAGNOSIS — M9905 Segmental and somatic dysfunction of pelvic region: Secondary | ICD-10-CM | POA: Diagnosis not present

## 2024-03-01 DIAGNOSIS — H401131 Primary open-angle glaucoma, bilateral, mild stage: Secondary | ICD-10-CM | POA: Diagnosis not present

## 2024-03-01 DIAGNOSIS — Z961 Presence of intraocular lens: Secondary | ICD-10-CM | POA: Diagnosis not present

## 2024-03-01 DIAGNOSIS — M5126 Other intervertebral disc displacement, lumbar region: Secondary | ICD-10-CM | POA: Diagnosis not present

## 2024-03-01 DIAGNOSIS — M9902 Segmental and somatic dysfunction of thoracic region: Secondary | ICD-10-CM | POA: Diagnosis not present

## 2024-03-22 DIAGNOSIS — M5127 Other intervertebral disc displacement, lumbosacral region: Secondary | ICD-10-CM | POA: Diagnosis not present

## 2024-03-22 DIAGNOSIS — M9901 Segmental and somatic dysfunction of cervical region: Secondary | ICD-10-CM | POA: Diagnosis not present

## 2024-03-22 DIAGNOSIS — M9903 Segmental and somatic dysfunction of lumbar region: Secondary | ICD-10-CM | POA: Diagnosis not present

## 2024-03-22 DIAGNOSIS — M9905 Segmental and somatic dysfunction of pelvic region: Secondary | ICD-10-CM | POA: Diagnosis not present

## 2024-03-22 DIAGNOSIS — M9902 Segmental and somatic dysfunction of thoracic region: Secondary | ICD-10-CM | POA: Diagnosis not present

## 2024-03-22 DIAGNOSIS — M5126 Other intervertebral disc displacement, lumbar region: Secondary | ICD-10-CM | POA: Diagnosis not present

## 2024-03-22 DIAGNOSIS — M47812 Spondylosis without myelopathy or radiculopathy, cervical region: Secondary | ICD-10-CM | POA: Diagnosis not present

## 2024-03-22 DIAGNOSIS — S29012A Strain of muscle and tendon of back wall of thorax, initial encounter: Secondary | ICD-10-CM | POA: Diagnosis not present

## 2024-03-28 DIAGNOSIS — M25511 Pain in right shoulder: Secondary | ICD-10-CM | POA: Diagnosis not present

## 2024-03-28 DIAGNOSIS — Z0000 Encounter for general adult medical examination without abnormal findings: Secondary | ICD-10-CM | POA: Diagnosis not present

## 2024-03-28 DIAGNOSIS — C50412 Malignant neoplasm of upper-outer quadrant of left female breast: Secondary | ICD-10-CM | POA: Diagnosis not present

## 2024-03-28 DIAGNOSIS — Z79899 Other long term (current) drug therapy: Secondary | ICD-10-CM | POA: Diagnosis not present

## 2024-03-28 DIAGNOSIS — I5032 Chronic diastolic (congestive) heart failure: Secondary | ICD-10-CM | POA: Diagnosis not present

## 2024-03-28 DIAGNOSIS — E559 Vitamin D deficiency, unspecified: Secondary | ICD-10-CM | POA: Diagnosis not present

## 2024-03-28 DIAGNOSIS — R519 Headache, unspecified: Secondary | ICD-10-CM | POA: Diagnosis not present

## 2024-03-28 DIAGNOSIS — I2510 Atherosclerotic heart disease of native coronary artery without angina pectoris: Secondary | ICD-10-CM | POA: Diagnosis not present

## 2024-03-28 DIAGNOSIS — I10 Essential (primary) hypertension: Secondary | ICD-10-CM | POA: Diagnosis not present

## 2024-03-28 DIAGNOSIS — K219 Gastro-esophageal reflux disease without esophagitis: Secondary | ICD-10-CM | POA: Diagnosis not present

## 2024-03-29 DIAGNOSIS — M5127 Other intervertebral disc displacement, lumbosacral region: Secondary | ICD-10-CM | POA: Diagnosis not present

## 2024-03-29 DIAGNOSIS — M47812 Spondylosis without myelopathy or radiculopathy, cervical region: Secondary | ICD-10-CM | POA: Diagnosis not present

## 2024-03-29 DIAGNOSIS — M9903 Segmental and somatic dysfunction of lumbar region: Secondary | ICD-10-CM | POA: Diagnosis not present

## 2024-03-29 DIAGNOSIS — M9901 Segmental and somatic dysfunction of cervical region: Secondary | ICD-10-CM | POA: Diagnosis not present

## 2024-03-29 DIAGNOSIS — M5126 Other intervertebral disc displacement, lumbar region: Secondary | ICD-10-CM | POA: Diagnosis not present

## 2024-03-29 DIAGNOSIS — M9902 Segmental and somatic dysfunction of thoracic region: Secondary | ICD-10-CM | POA: Diagnosis not present

## 2024-03-29 DIAGNOSIS — M9905 Segmental and somatic dysfunction of pelvic region: Secondary | ICD-10-CM | POA: Diagnosis not present

## 2024-03-29 DIAGNOSIS — S29012A Strain of muscle and tendon of back wall of thorax, initial encounter: Secondary | ICD-10-CM | POA: Diagnosis not present

## 2024-04-04 DIAGNOSIS — S29012A Strain of muscle and tendon of back wall of thorax, initial encounter: Secondary | ICD-10-CM | POA: Diagnosis not present

## 2024-04-04 DIAGNOSIS — M5126 Other intervertebral disc displacement, lumbar region: Secondary | ICD-10-CM | POA: Diagnosis not present

## 2024-04-04 DIAGNOSIS — M9905 Segmental and somatic dysfunction of pelvic region: Secondary | ICD-10-CM | POA: Diagnosis not present

## 2024-04-04 DIAGNOSIS — M9903 Segmental and somatic dysfunction of lumbar region: Secondary | ICD-10-CM | POA: Diagnosis not present

## 2024-04-04 DIAGNOSIS — M5127 Other intervertebral disc displacement, lumbosacral region: Secondary | ICD-10-CM | POA: Diagnosis not present

## 2024-04-04 DIAGNOSIS — M47812 Spondylosis without myelopathy or radiculopathy, cervical region: Secondary | ICD-10-CM | POA: Diagnosis not present

## 2024-04-04 DIAGNOSIS — M9901 Segmental and somatic dysfunction of cervical region: Secondary | ICD-10-CM | POA: Diagnosis not present

## 2024-04-04 DIAGNOSIS — M9902 Segmental and somatic dysfunction of thoracic region: Secondary | ICD-10-CM | POA: Diagnosis not present

## 2024-04-05 DIAGNOSIS — M25511 Pain in right shoulder: Secondary | ICD-10-CM | POA: Diagnosis not present

## 2024-04-20 DIAGNOSIS — M9905 Segmental and somatic dysfunction of pelvic region: Secondary | ICD-10-CM | POA: Diagnosis not present

## 2024-04-20 DIAGNOSIS — M9902 Segmental and somatic dysfunction of thoracic region: Secondary | ICD-10-CM | POA: Diagnosis not present

## 2024-04-20 DIAGNOSIS — M5126 Other intervertebral disc displacement, lumbar region: Secondary | ICD-10-CM | POA: Diagnosis not present

## 2024-04-20 DIAGNOSIS — M5127 Other intervertebral disc displacement, lumbosacral region: Secondary | ICD-10-CM | POA: Diagnosis not present

## 2024-04-20 DIAGNOSIS — S29012A Strain of muscle and tendon of back wall of thorax, initial encounter: Secondary | ICD-10-CM | POA: Diagnosis not present

## 2024-04-20 DIAGNOSIS — L538 Other specified erythematous conditions: Secondary | ICD-10-CM | POA: Diagnosis not present

## 2024-04-20 DIAGNOSIS — Z789 Other specified health status: Secondary | ICD-10-CM | POA: Diagnosis not present

## 2024-04-20 DIAGNOSIS — M9901 Segmental and somatic dysfunction of cervical region: Secondary | ICD-10-CM | POA: Diagnosis not present

## 2024-04-20 DIAGNOSIS — L820 Inflamed seborrheic keratosis: Secondary | ICD-10-CM | POA: Diagnosis not present

## 2024-04-20 DIAGNOSIS — M47812 Spondylosis without myelopathy or radiculopathy, cervical region: Secondary | ICD-10-CM | POA: Diagnosis not present

## 2024-04-20 DIAGNOSIS — M9903 Segmental and somatic dysfunction of lumbar region: Secondary | ICD-10-CM | POA: Diagnosis not present

## 2024-04-28 DIAGNOSIS — M542 Cervicalgia: Secondary | ICD-10-CM | POA: Diagnosis not present

## 2024-04-28 DIAGNOSIS — M5412 Radiculopathy, cervical region: Secondary | ICD-10-CM | POA: Diagnosis not present

## 2024-04-28 DIAGNOSIS — Z6827 Body mass index (BMI) 27.0-27.9, adult: Secondary | ICD-10-CM | POA: Diagnosis not present

## 2024-04-28 DIAGNOSIS — R519 Headache, unspecified: Secondary | ICD-10-CM | POA: Diagnosis not present

## 2024-05-09 DIAGNOSIS — M5126 Other intervertebral disc displacement, lumbar region: Secondary | ICD-10-CM | POA: Diagnosis not present

## 2024-05-09 DIAGNOSIS — M9903 Segmental and somatic dysfunction of lumbar region: Secondary | ICD-10-CM | POA: Diagnosis not present

## 2024-05-09 DIAGNOSIS — M9902 Segmental and somatic dysfunction of thoracic region: Secondary | ICD-10-CM | POA: Diagnosis not present

## 2024-05-09 DIAGNOSIS — M9901 Segmental and somatic dysfunction of cervical region: Secondary | ICD-10-CM | POA: Diagnosis not present

## 2024-05-09 DIAGNOSIS — M5127 Other intervertebral disc displacement, lumbosacral region: Secondary | ICD-10-CM | POA: Diagnosis not present

## 2024-05-09 DIAGNOSIS — M9905 Segmental and somatic dysfunction of pelvic region: Secondary | ICD-10-CM | POA: Diagnosis not present

## 2024-05-09 DIAGNOSIS — S29012A Strain of muscle and tendon of back wall of thorax, initial encounter: Secondary | ICD-10-CM | POA: Diagnosis not present

## 2024-05-09 DIAGNOSIS — M47812 Spondylosis without myelopathy or radiculopathy, cervical region: Secondary | ICD-10-CM | POA: Diagnosis not present

## 2024-05-12 DIAGNOSIS — M47812 Spondylosis without myelopathy or radiculopathy, cervical region: Secondary | ICD-10-CM | POA: Diagnosis not present

## 2024-05-12 DIAGNOSIS — I6782 Cerebral ischemia: Secondary | ICD-10-CM | POA: Diagnosis not present

## 2024-05-12 DIAGNOSIS — R519 Headache, unspecified: Secondary | ICD-10-CM | POA: Diagnosis not present

## 2024-05-12 DIAGNOSIS — M5412 Radiculopathy, cervical region: Secondary | ICD-10-CM | POA: Diagnosis not present

## 2024-05-12 DIAGNOSIS — M50321 Other cervical disc degeneration at C4-C5 level: Secondary | ICD-10-CM | POA: Diagnosis not present

## 2024-05-12 DIAGNOSIS — M5031 Other cervical disc degeneration,  high cervical region: Secondary | ICD-10-CM | POA: Diagnosis not present

## 2024-05-12 DIAGNOSIS — M4802 Spinal stenosis, cervical region: Secondary | ICD-10-CM | POA: Diagnosis not present

## 2024-05-17 DIAGNOSIS — Z789 Other specified health status: Secondary | ICD-10-CM | POA: Diagnosis not present

## 2024-05-17 DIAGNOSIS — L820 Inflamed seborrheic keratosis: Secondary | ICD-10-CM | POA: Diagnosis not present

## 2024-05-17 DIAGNOSIS — L538 Other specified erythematous conditions: Secondary | ICD-10-CM | POA: Diagnosis not present

## 2024-05-20 DIAGNOSIS — M25511 Pain in right shoulder: Secondary | ICD-10-CM | POA: Diagnosis not present

## 2024-05-23 DIAGNOSIS — M5127 Other intervertebral disc displacement, lumbosacral region: Secondary | ICD-10-CM | POA: Diagnosis not present

## 2024-05-23 DIAGNOSIS — M47812 Spondylosis without myelopathy or radiculopathy, cervical region: Secondary | ICD-10-CM | POA: Diagnosis not present

## 2024-05-23 DIAGNOSIS — M9901 Segmental and somatic dysfunction of cervical region: Secondary | ICD-10-CM | POA: Diagnosis not present

## 2024-05-23 DIAGNOSIS — M9903 Segmental and somatic dysfunction of lumbar region: Secondary | ICD-10-CM | POA: Diagnosis not present

## 2024-05-23 DIAGNOSIS — S29012A Strain of muscle and tendon of back wall of thorax, initial encounter: Secondary | ICD-10-CM | POA: Diagnosis not present

## 2024-05-23 DIAGNOSIS — M9902 Segmental and somatic dysfunction of thoracic region: Secondary | ICD-10-CM | POA: Diagnosis not present

## 2024-05-23 DIAGNOSIS — M9905 Segmental and somatic dysfunction of pelvic region: Secondary | ICD-10-CM | POA: Diagnosis not present

## 2024-05-23 DIAGNOSIS — M5126 Other intervertebral disc displacement, lumbar region: Secondary | ICD-10-CM | POA: Diagnosis not present

## 2024-05-25 DIAGNOSIS — G5601 Carpal tunnel syndrome, right upper limb: Secondary | ICD-10-CM | POA: Diagnosis not present

## 2024-05-26 ENCOUNTER — Encounter: Payer: Self-pay | Admitting: Neurology

## 2024-05-26 ENCOUNTER — Other Ambulatory Visit: Payer: Self-pay

## 2024-05-26 DIAGNOSIS — R202 Paresthesia of skin: Secondary | ICD-10-CM

## 2024-05-31 DIAGNOSIS — M25511 Pain in right shoulder: Secondary | ICD-10-CM | POA: Diagnosis not present

## 2024-06-03 ENCOUNTER — Ambulatory Visit: Admitting: Neurology

## 2024-06-03 DIAGNOSIS — R202 Paresthesia of skin: Secondary | ICD-10-CM | POA: Diagnosis not present

## 2024-06-03 DIAGNOSIS — G5601 Carpal tunnel syndrome, right upper limb: Secondary | ICD-10-CM

## 2024-06-03 NOTE — Procedures (Signed)
 [facility]  [address] [place], [address]  [place], [place] [address] Tel: [phone] Fax: [phone] Test Date:  06/03/2024  Patient: Susan Randolph DOB: 23-Aug-1946 Physician: Tonita Blanch, DO  Sex: Female Height: 5' 4 Ref Phys: Victory Gunnels, MD  ID#: [id number]   Technician:    History: This is a 78 year old female referred for evaluation of bilateral hand paresthesias.  NCV & EMG Findings: Extensive electrodiagnostic testing of the right upper extremity and additional studies of the left shows:  Right median sensory response shows prolonged latency (5.3 ms).  Left median, left mixed palmar, and bilateral ulnar sensory responses are within normal limits. Right median motor response shows prolonged latency (4.5 ms).  Left median and bilateral ulnar motor responses are within normal limits.   There is no evidence of active or chronic motor axonal loss changes affecting any of the tested muscles.  Motor unit configuration and recruitment pattern is within normal limits.    Impression: Right median neuropathy at or distal to the wrist, consistent with a clinical diagnosis of carpal tunnel syndrome.  Overall, these findings are moderate in degree electrically. There is no evidence of left carpal tunnel syndrome or cervical radiculopathy affecting either upper extremity.   ___________________________ Tonita Blanch, DO    Nerve Conduction Studies   Stim Site NR Peak (ms) Norm Peak (ms) O-P Amp (V) Norm O-P Amp  Left Median Anti Sensory (2nd Digit)  32 C  Wrist    3.3 <3.8 33.1 >10  Right Median Anti Sensory (2nd Digit)  32 C  Wrist    *5.3 <3.8 13.7 >10  Left Ulnar Anti Sensory (5th Digit)  32 C  Wrist    3.2 <3.2 22.3 >5  Right Ulnar Anti Sensory (5th Digit)  32 C  Wrist    3.1 <3.2 21.4 >5     Stim Site NR Onset (ms) Norm Onset (ms) O-P Amp (mV) Norm O-P Amp Site1 Site2 Delta-0 (ms) Dist (cm) Vel (m/s) Norm Vel (m/s)  Left Median Motor (Abd Poll Brev)  32 C   Wrist    3.0 <4.0 8.2 >5 Elbow Wrist 4.9 28.0 57 >50  Elbow    7.9  7.5         Right Median Motor (Abd Poll Brev)  32 C  Wrist    *4.5 <4.0 5.8 >5 Elbow Wrist 4.6 28.0 61 >50  Elbow    9.1  5.2         Left Ulnar Motor (Abd Dig Minimi)  32 C  Wrist    2.3 <3.1 7.5 >7 B Elbow Wrist 3.6 21.0 58 >50  B Elbow    5.9  6.6  A Elbow B Elbow 1.5 10.0 67 >50  A Elbow    7.4  6.4         Right Ulnar Motor (Abd Dig Minimi)  32 C  Wrist    2.5 <3.1 8.1 >7 B Elbow Wrist 3.6 21.0 58 >50  B Elbow    6.1  7.8  A Elbow B Elbow 1.7 10.0 59 >50  A Elbow    7.8  7.8            Stim Site NR Peak (ms) Norm Peak (ms) P-T Amp (V) Site1 Site2 Delta-P (ms) Norm Delta (ms)  Left Median/Ulnar Palm Comparison (Wrist - 8cm)  32 C  Median Palm    1.6 <2.2 55.6 Median Palm Ulnar Palm 0.0   Ulnar Palm    1.6 <2.2  8.8       Electromyography   Side Muscle Ins.Act Fibs Fasc Recrt Amp Dur Poly Activation Comment  Right 1stDorInt Nml Nml Nml Nml Nml Nml Nml Nml N/A  Right Abd Poll Brev Nml Nml Nml Nml Nml Nml Nml Nml N/A  Right PronatorTeres Nml Nml Nml Nml Nml Nml Nml Nml N/A  Right Biceps Nml Nml Nml Nml Nml Nml Nml Nml N/A  Right Triceps Nml Nml Nml Nml Nml Nml Nml Nml N/A  Right Deltoid Nml Nml Nml Nml Nml Nml Nml Nml N/A  Left 1stDorInt Nml Nml Nml Nml Nml Nml Nml Nml N/A  Left PronatorTeres Nml Nml Nml Nml Nml Nml Nml Nml N/A  Left Biceps Nml Nml Nml Nml Nml Nml Nml Nml N/A  Left Triceps Nml Nml Nml Nml Nml Nml Nml Nml N/A  Left Deltoid Nml Nml Nml Nml Nml Nml Nml Nml N/A      Waveforms:

## 2024-06-08 DIAGNOSIS — G5601 Carpal tunnel syndrome, right upper limb: Secondary | ICD-10-CM | POA: Diagnosis not present

## 2024-06-08 DIAGNOSIS — Z6827 Body mass index (BMI) 27.0-27.9, adult: Secondary | ICD-10-CM | POA: Diagnosis not present

## 2024-07-05 DIAGNOSIS — M9902 Segmental and somatic dysfunction of thoracic region: Secondary | ICD-10-CM | POA: Diagnosis not present

## 2024-07-05 DIAGNOSIS — M9901 Segmental and somatic dysfunction of cervical region: Secondary | ICD-10-CM | POA: Diagnosis not present

## 2024-07-05 DIAGNOSIS — M5127 Other intervertebral disc displacement, lumbosacral region: Secondary | ICD-10-CM | POA: Diagnosis not present

## 2024-07-05 DIAGNOSIS — M9903 Segmental and somatic dysfunction of lumbar region: Secondary | ICD-10-CM | POA: Diagnosis not present

## 2024-07-05 DIAGNOSIS — M5032 Other cervical disc degeneration, mid-cervical region, unspecified level: Secondary | ICD-10-CM | POA: Diagnosis not present

## 2024-07-05 DIAGNOSIS — M5126 Other intervertebral disc displacement, lumbar region: Secondary | ICD-10-CM | POA: Diagnosis not present

## 2024-07-05 DIAGNOSIS — S29012A Strain of muscle and tendon of back wall of thorax, initial encounter: Secondary | ICD-10-CM | POA: Diagnosis not present

## 2024-07-05 DIAGNOSIS — M9905 Segmental and somatic dysfunction of pelvic region: Secondary | ICD-10-CM | POA: Diagnosis not present

## 2024-07-14 ENCOUNTER — Encounter: Admitting: Neurology

## 2024-08-09 DIAGNOSIS — M5127 Other intervertebral disc displacement, lumbosacral region: Secondary | ICD-10-CM | POA: Diagnosis not present

## 2024-08-09 DIAGNOSIS — M9901 Segmental and somatic dysfunction of cervical region: Secondary | ICD-10-CM | POA: Diagnosis not present

## 2024-08-09 DIAGNOSIS — M5126 Other intervertebral disc displacement, lumbar region: Secondary | ICD-10-CM | POA: Diagnosis not present

## 2024-08-09 DIAGNOSIS — M5032 Other cervical disc degeneration, mid-cervical region, unspecified level: Secondary | ICD-10-CM | POA: Diagnosis not present

## 2024-08-09 DIAGNOSIS — M9902 Segmental and somatic dysfunction of thoracic region: Secondary | ICD-10-CM | POA: Diagnosis not present

## 2024-08-09 DIAGNOSIS — M9905 Segmental and somatic dysfunction of pelvic region: Secondary | ICD-10-CM | POA: Diagnosis not present

## 2024-08-09 DIAGNOSIS — S29012A Strain of muscle and tendon of back wall of thorax, initial encounter: Secondary | ICD-10-CM | POA: Diagnosis not present

## 2024-08-09 DIAGNOSIS — M9903 Segmental and somatic dysfunction of lumbar region: Secondary | ICD-10-CM | POA: Diagnosis not present

## 2024-08-16 DIAGNOSIS — S29012A Strain of muscle and tendon of back wall of thorax, initial encounter: Secondary | ICD-10-CM | POA: Diagnosis not present

## 2024-08-16 DIAGNOSIS — M9905 Segmental and somatic dysfunction of pelvic region: Secondary | ICD-10-CM | POA: Diagnosis not present

## 2024-08-16 DIAGNOSIS — M9902 Segmental and somatic dysfunction of thoracic region: Secondary | ICD-10-CM | POA: Diagnosis not present

## 2024-08-16 DIAGNOSIS — M5127 Other intervertebral disc displacement, lumbosacral region: Secondary | ICD-10-CM | POA: Diagnosis not present

## 2024-08-16 DIAGNOSIS — M9901 Segmental and somatic dysfunction of cervical region: Secondary | ICD-10-CM | POA: Diagnosis not present

## 2024-08-16 DIAGNOSIS — M5126 Other intervertebral disc displacement, lumbar region: Secondary | ICD-10-CM | POA: Diagnosis not present

## 2024-08-16 DIAGNOSIS — M5032 Other cervical disc degeneration, mid-cervical region, unspecified level: Secondary | ICD-10-CM | POA: Diagnosis not present

## 2024-08-16 DIAGNOSIS — M9903 Segmental and somatic dysfunction of lumbar region: Secondary | ICD-10-CM | POA: Diagnosis not present

## 2024-08-30 DIAGNOSIS — M5032 Other cervical disc degeneration, mid-cervical region, unspecified level: Secondary | ICD-10-CM | POA: Diagnosis not present

## 2024-08-30 DIAGNOSIS — M9905 Segmental and somatic dysfunction of pelvic region: Secondary | ICD-10-CM | POA: Diagnosis not present

## 2024-08-30 DIAGNOSIS — M9902 Segmental and somatic dysfunction of thoracic region: Secondary | ICD-10-CM | POA: Diagnosis not present

## 2024-08-30 DIAGNOSIS — M5127 Other intervertebral disc displacement, lumbosacral region: Secondary | ICD-10-CM | POA: Diagnosis not present

## 2024-08-30 DIAGNOSIS — M5126 Other intervertebral disc displacement, lumbar region: Secondary | ICD-10-CM | POA: Diagnosis not present

## 2024-08-30 DIAGNOSIS — S29012A Strain of muscle and tendon of back wall of thorax, initial encounter: Secondary | ICD-10-CM | POA: Diagnosis not present

## 2024-08-30 DIAGNOSIS — M9903 Segmental and somatic dysfunction of lumbar region: Secondary | ICD-10-CM | POA: Diagnosis not present

## 2024-08-30 DIAGNOSIS — M9901 Segmental and somatic dysfunction of cervical region: Secondary | ICD-10-CM | POA: Diagnosis not present

## 2024-09-13 DIAGNOSIS — M9903 Segmental and somatic dysfunction of lumbar region: Secondary | ICD-10-CM | POA: Diagnosis not present

## 2024-09-13 DIAGNOSIS — S29012A Strain of muscle and tendon of back wall of thorax, initial encounter: Secondary | ICD-10-CM | POA: Diagnosis not present

## 2024-09-13 DIAGNOSIS — M9905 Segmental and somatic dysfunction of pelvic region: Secondary | ICD-10-CM | POA: Diagnosis not present

## 2024-09-13 DIAGNOSIS — M5126 Other intervertebral disc displacement, lumbar region: Secondary | ICD-10-CM | POA: Diagnosis not present

## 2024-09-13 DIAGNOSIS — M9902 Segmental and somatic dysfunction of thoracic region: Secondary | ICD-10-CM | POA: Diagnosis not present

## 2024-09-13 DIAGNOSIS — M5127 Other intervertebral disc displacement, lumbosacral region: Secondary | ICD-10-CM | POA: Diagnosis not present

## 2024-09-13 DIAGNOSIS — M5032 Other cervical disc degeneration, mid-cervical region, unspecified level: Secondary | ICD-10-CM | POA: Diagnosis not present

## 2024-09-13 DIAGNOSIS — M9901 Segmental and somatic dysfunction of cervical region: Secondary | ICD-10-CM | POA: Diagnosis not present

## 2024-09-28 DIAGNOSIS — M5126 Other intervertebral disc displacement, lumbar region: Secondary | ICD-10-CM | POA: Diagnosis not present

## 2024-09-28 DIAGNOSIS — M9902 Segmental and somatic dysfunction of thoracic region: Secondary | ICD-10-CM | POA: Diagnosis not present

## 2024-09-28 DIAGNOSIS — M5032 Other cervical disc degeneration, mid-cervical region, unspecified level: Secondary | ICD-10-CM | POA: Diagnosis not present

## 2024-09-28 DIAGNOSIS — M9905 Segmental and somatic dysfunction of pelvic region: Secondary | ICD-10-CM | POA: Diagnosis not present

## 2024-09-28 DIAGNOSIS — M5127 Other intervertebral disc displacement, lumbosacral region: Secondary | ICD-10-CM | POA: Diagnosis not present

## 2024-09-28 DIAGNOSIS — M9901 Segmental and somatic dysfunction of cervical region: Secondary | ICD-10-CM | POA: Diagnosis not present

## 2024-09-28 DIAGNOSIS — S29012A Strain of muscle and tendon of back wall of thorax, initial encounter: Secondary | ICD-10-CM | POA: Diagnosis not present

## 2024-09-28 DIAGNOSIS — M9903 Segmental and somatic dysfunction of lumbar region: Secondary | ICD-10-CM | POA: Diagnosis not present

## 2024-10-03 ENCOUNTER — Telehealth: Payer: Self-pay | Admitting: Hematology and Oncology

## 2024-10-03 NOTE — Telephone Encounter (Signed)
 I spoke with patient regarding 11/07/24 appt being rescheduled to 11/17/24. Patient is aware of new appt date and time.

## 2024-10-27 ENCOUNTER — Other Ambulatory Visit: Payer: Self-pay | Admitting: Internal Medicine

## 2024-10-27 DIAGNOSIS — Z1231 Encounter for screening mammogram for malignant neoplasm of breast: Secondary | ICD-10-CM

## 2024-11-01 ENCOUNTER — Inpatient Hospital Stay: Admission: RE | Admit: 2024-11-01 | Discharge: 2024-11-01 | Attending: Internal Medicine | Admitting: Internal Medicine

## 2024-11-01 DIAGNOSIS — Z1231 Encounter for screening mammogram for malignant neoplasm of breast: Secondary | ICD-10-CM

## 2024-11-07 ENCOUNTER — Ambulatory Visit: Payer: Medicare PPO | Admitting: Hematology and Oncology

## 2024-11-07 ENCOUNTER — Other Ambulatory Visit: Payer: Self-pay

## 2024-11-14 NOTE — Telephone Encounter (Signed)
 Called patient about refill. She said to send it to her PCP, that she does not see Dr. Jeffrie, that he told her at her last office visit to follow-up as needed.

## 2024-11-17 ENCOUNTER — Inpatient Hospital Stay: Admitting: Hematology and Oncology

## 2024-11-17 ENCOUNTER — Inpatient Hospital Stay: Attending: Hematology and Oncology | Admitting: Hematology and Oncology

## 2024-11-17 VITALS — BP 139/65 | HR 73 | Temp 97.6°F | Resp 18 | Wt 154.5 lb

## 2024-11-17 DIAGNOSIS — Z923 Personal history of irradiation: Secondary | ICD-10-CM | POA: Insufficient documentation

## 2024-11-17 DIAGNOSIS — M8580 Other specified disorders of bone density and structure, unspecified site: Secondary | ICD-10-CM | POA: Diagnosis not present

## 2024-11-17 DIAGNOSIS — Z1721 Progesterone receptor positive status: Secondary | ICD-10-CM | POA: Insufficient documentation

## 2024-11-17 DIAGNOSIS — C50412 Malignant neoplasm of upper-outer quadrant of left female breast: Secondary | ICD-10-CM | POA: Diagnosis present

## 2024-11-17 DIAGNOSIS — Z1732 Human epidermal growth factor receptor 2 negative status: Secondary | ICD-10-CM | POA: Diagnosis not present

## 2024-11-17 DIAGNOSIS — Z79811 Long term (current) use of aromatase inhibitors: Secondary | ICD-10-CM | POA: Diagnosis not present

## 2024-11-17 DIAGNOSIS — Z170 Estrogen receptor positive status [ER+]: Secondary | ICD-10-CM | POA: Diagnosis not present

## 2024-11-17 NOTE — Progress Notes (Signed)
 " [facility]  [phone] Fax:([phone]     ID: [name] DOB: 01-30-1946  MR#: [id number]  RDW#:[id number]  Patient Care Team: Charlott Dorn LABOR, MD as PCP - General (Internal Medicine) Jeffrie Oneil BROCKS, MD as PCP - Cardiology (Cardiology) Tyree Nanetta SAILOR, RN as Oncology Nurse Navigator Ethyl Lenis, MD as Consulting Physician (General Surgery) Izell Domino, MD as Attending Physician (Radiation Oncology) Melodi Lerner, MD as Consulting Physician (Orthopedic Surgery) Louis Shove, MD as Consulting Physician (Neurosurgery) Rosalva Sawyer, MD as Consulting Physician (Obstetrics and Gynecology) Rosalie Kitchens, MD as Consulting Physician (Gastroenterology) Delice Charleston, MD (Inactive) (Internal Medicine) Delice Charleston, MD (Inactive) (Internal Medicine) Amber Stalls, MD  CHIEF COMPLAINT: estrogen receptor positive breast cancer  CURRENT TREATMENT: anastrozole   INTERVAL HISTORY: Discussed the use of AI scribe software for clinical note transcription with the patient, who gave verbal consent to proceed.  History of Present Illness    Susan Randolph returns today for follow up of her estrogen receptor positive breast cancer.   Discussed the use of AI scribe software for clinical note transcription with the patient, who gave verbal consent to proceed.  History of Present Illness Susan Randolph is a 79 year old female with stage IA, ER/PR-positive, HER2-negative left breast invasive ductal carcinoma status post lumpectomy, adjuvant radiation, and adjuvant anastrozole  therapy who presents for oncology follow-up and transition to survivorship care.  She is nearing completion of five years of adjuvant anastrozole , initiated in March 2021 and expected to conclude in April 2026. She has tolerated anastrozole  well, with only manageable side effects. She denies new breast masses, nipple discharge, or systemic symptoms including weight loss, fatigue, fevers, or chills.  Her most recent screening mammogram in January 2026 was normal, and there is no evidence of disease recurrence.  She continues to experience soreness in the left breast and shoulder region, which she attributes to prior radiation therapy. She also has persistent right shoulder pain secondary to arthritis, previously managed with an injection.  She is not taking vitamin D  or calcium  supplements per endocrinology recommendation due to persistently mildly elevated serum calcium  levels (10.3-10.4 mg/dL over several years). Parathyroid hormone levels have remained normal, and she is scheduled for repeat laboratory evaluation. She remains physically active, engaging in weight-bearing and strength training exercises at the [facility] as recommended for bone health.  Rest of the pertinent 10 point ROS reviewed and negative   COVID 19 VACCINATION STATUS: Pfizer x2; infection 02/2021   HISTORY OF CURRENT ILLNESS: From the original intake note:  Susan Randolph had routine screening mammography on 08/02/2019 showing a possible abnormality in the left breast. She underwent left diagnostic mammography with tomography and left breast ultrasonography at [facility] on 08/08/2019 showing: breast density category B; 6 mm mass in the left breast at 12 o'clock; no enlarged or abnormal left axillary lymph nodes.  Accordingly on 08/09/2019 she proceeded to biopsy of the left breast area in question. The pathology from this procedure ([id number]) showed: invasive mammary carcinoma, grade 2, e-cadherin positive. Prognostic indicators significant for: estrogen receptor, 100% positive and progesterone receptor, 95% positive, both with strong staining intensity. Proliferation marker Ki67 at 2%. HER2 equivocal by immunohistochemistry (2+), but negative by fluorescent in situ hybridization with a signals ratio 1.19 and number per cell 1.85.  The patient's subsequent history is as detailed below.   PAST MEDICAL  HISTORY: Past Medical History:  Diagnosis Date   Anxiety    Arthritis    Breast cancer (HCC) 06/2019  left IMC   Cancer (HCC)    basal cell carcinoma   GERD (gastroesophageal reflux disease)    History of radiation therapy 10/24/19- 11/18/19   Left Breast 15 fractions of 2.67 Gy to total 40.05 Gy. Left breast boost 5 fractions of 2 Gy to total 10 Gy   Personal history of radiation therapy 10/2019   Rectal bleed 09/2016    PAST SURGICAL HISTORY: Past Surgical History:  Procedure Laterality Date   BREAST BIOPSY Left 08/2019   BREAST LUMPECTOMY Left 2020   BREAST LUMPECTOMY WITH RADIOACTIVE SEED AND SENTINEL LYMPH NODE BIOPSY Left 08/31/2019   Procedure: LEFT BREAST LUMPECTOMY WITH RADIOACTIVE SEED AND LEFT AXILLARY SENTINEL LYMPH NODE BIOPSY;  Surgeon: Ethyl Lenis, MD;  Location: [facility];  Service: General;  Laterality: Left;   EYE SURGERY     Corrected drooping eye lids bilateral   MENISCUS REPAIR Right 03/29/2019   PARTIAL KNEE ARTHROPLASTY Right 10/31/2020   Procedure: Right knee medial unicompartmental arthroplasty;  Surgeon: Melodi Lerner, MD;  Location: [facility];  Service: Orthopedics;  Laterality: Right;    POSTERIOR CERVICAL FUSION/FORAMINOTOMY N/A 01/11/2018   Procedure: Posterior Cervical Fusion with lateral mass fixation - Cervical one-Cervical two with Iliac Crest bone graft;  Surgeon: Louis Shove, MD;  Location: [facility] OR;  Service: Neurosurgery;  Laterality: N/A;   TUBAL LIGATION      FAMILY HISTORY: Family History  Problem Relation Age of Onset   Cancer Mother        breast cancer/endometrial cancer   Hypertension Mother    Breast cancer Mother    Hypertension Father    Cancer Father        Glioblastoma   Arthritis Father    Rheum arthritis Maternal Uncle    Colon cancer Maternal Uncle   Patient's father was 40 years old when he died from glioblastoma. Patient's mother is 55 years old as of 07/2019.  She was diagnosed with breast cancer at  age 20 and with endometrial cancer at age 30. The patient denies a family hx of ovarian cancer. She has no siblings. She also notes a maternal uncle with colon cancer in his 6s.   GYNECOLOGIC HISTORY:  No LMP recorded. Patient is postmenopausal. Menarche: 79 years old Age at first live birth: 79 years old GX P 3 LMP age 82 Contraceptive yes, unsure how long, no issues HRT yes, used for 5 years  Hysterectomy? no BSO? 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  Husband Susan Randolph) is a retired personnel officer. She lives at home with Susan Randolph. Daughter Susan Randolph, age 47, is a homemaker in [place]. Daughter Susan Randolph, age 88, is a engineer, civil (consulting) at the [employer] hospital in [place] [facility]. The patient lost her son Susan Randolph to a car accident in 2015.  The patient has 3 grandchildren.  She attends [employer].    ADVANCED DIRECTIVES: In the absence of any documentation to the contrary, the patient's spouse is her HCPOA.   HEALTH MAINTENANCE: Social History   Tobacco Use   Smoking status: Never   Smokeless tobacco: Never  Vaping Use   Vaping status: Never Used  Substance Use Topics   Alcohol use: Yes    Comment: social   Drug use: No     Colonoscopy: 2014  PAP: 03/2017, negative  Bone density: scheduled 08/2019   Allergies  Allergen Reactions   Amitriptyline Other (See Comments)  tremors   Sulfa Antibiotics Other (See Comments)    Doesn't remember, happened in childhood    Current Outpatient Medications  Medication Sig Dispense Refill   acetaminophen  (TYLENOL ) 500 MG tablet Take 1,000 mg by mouth 2 (two) times daily as needed for mild pain.     ALPRAZolam  (XANAX ) 0.25 MG tablet Take 0.25 mg by mouth daily as needed for anxiety.     anastrozole  (ARIMIDEX ) 1 MG tablet TAKE 1 TABLET BY MOUTH EVERY DAY 90 tablet 3   Apoaequorin (PREVAGEN) 10 MG CAPS as directed Orally     Diclofenac 35 MG CAPS 1 capsule as  needed Orally two times a day     empagliflozin  (JARDIANCE ) 10 MG TABS tablet TAKE 1 TABLET BY MOUTH EVERY DAY BEFORE BREAKFAST 90 tablet 3   escitalopram (LEXAPRO) 10 MG tablet Take 10 mg by mouth daily.     Krill Oil (OMEGA-3) 500 MG CAPS as directed Orally     loratadine  (CLARITIN ) 10 MG tablet Take 10 mg by mouth daily.     Multiple Vitamins-Minerals (MULTI VITAMIN/MINERALS) TABS as directed Orally     olmesartan  (BENICAR ) 20 MG tablet TAKE 1 TABLET BY MOUTH EVERY DAY 90 tablet 3   pantoprazole (PROTONIX) 40 MG tablet Take by mouth.     rosuvastatin  (CRESTOR ) 10 MG tablet TAKE 1 TABLET BY MOUTH EVERY DAY 90 tablet 2   traZODone (DESYREL) 50 MG tablet Take 25 mg by mouth at bedtime.     No current facility-administered medications for this visit.    OBJECTIVE: white woman who appears younger than stated age  There were no vitals filed for this visit.      There is no height or weight on file to calculate BMI.   Wt Readings from Last 3 Encounters:  11/06/23 153 lb 3.2 oz (69.5 kg)  10/16/23 151 lb 9.6 oz (68.8 kg)  04/20/23 145 lb 12.8 oz (66.1 kg)      ECOG FS:1 - Symptomatic but completely ambulatory  Sclerae unicteric, EOMs intact No palpable cervical adenopathy. Breast: Bilateral breasts inspected and palpated.  Scattered density noted.  No definitive palpable masses.  No regional adenopathy. No lower extremity edema  LAB RESULTS:  CMP     Component Value Date/Time   NA 138 11/06/2023 1122   NA 139 11/15/2021 0000   K 4.0 11/06/2023 1122   CL 107 11/06/2023 1122   CO2 28 11/06/2023 1122   GLUCOSE 96 11/06/2023 1122   BUN 24 (H) 11/06/2023 1122   BUN 24 11/15/2021 0000   CREATININE 0.79 11/06/2023 1122   CALCIUM  10.4 (H) 11/06/2023 1122   PROT 6.9 11/06/2023 1122   ALBUMIN 4.4 11/06/2023 1122   AST 29 11/06/2023 1122   ALT 39 11/06/2023 1122   ALKPHOS 67 11/06/2023 1122   BILITOT 0.4 11/06/2023 1122   GFRNONAA >60 11/06/2023 1122   GFRAA >60 04/09/2020 1255    GFRAA >60 08/17/2019 0846    No results found for: TOTALPROTELP, ALBUMINELP, A1GS, A2GS, BETS, BETA2SER, GAMS, MSPIKE, SPEI  No results found for: KPAFRELGTCHN, LAMBDASER, KAPLAMBRATIO  Lab Results  Component Value Date   WBC 4.7 11/06/2023   NEUTROABS 2.8 11/06/2023   HGB 13.9 11/06/2023   HCT 40.8 11/06/2023   MCV 89.5 11/06/2023   PLT 172 11/06/2023   No results found for: LABCA2  No components found for: [id number]  No results for input(s): INR in the last 168 hours.  No results found for: LABCA2  No results found for:  RJW800  No results found for: CAN125  No results found for: CAN153  No results found for: CA2729  No components found for: HGQUANT  No results found for: CEA1, CEA / No results found for: CEA1, CEA   No results found for: AFPTUMOR  No results found for: CHROMOGRNA  No results found for: HGBA, HGBA2QUANT, HGBFQUANT, HGBSQUAN (Hemoglobinopathy evaluation)   No results found for: LDH  No results found for: IRON, TIBC, IRONPCTSAT (Iron and TIBC)  No results found for: FERRITIN  Urinalysis No results found for: COLORURINE, APPEARANCEUR, LABSPEC, PHURINE, GLUCOSEU, HGBUR, BILIRUBINUR, KETONESUR, PROTEINUR, UROBILINOGEN, NITRITE, LEUKOCYTESUR   STUDIES: MM 3D SCREENING MAMMOGRAM BILATERAL BREAST Result Date: 11/03/2024 CLINICAL DATA:  Screening. EXAM: DIGITAL SCREENING BILATERAL MAMMOGRAM WITH TOMOSYNTHESIS AND CAD TECHNIQUE: Bilateral screening digital craniocaudal and mediolateral oblique mammograms were obtained. Bilateral screening digital breast tomosynthesis was performed. The images were evaluated with computer-aided detection. COMPARISON:  Previous exam(s). ACR Breast Density Category b: There are scattered areas of fibroglandular density. FINDINGS: There are no findings suspicious for malignancy. IMPRESSION: No mammographic evidence of  malignancy. A result letter of this screening mammogram will be mailed directly to the patient. RECOMMENDATION: Screening mammogram in one year. (Code:SM-B-01Y) BI-RADS CATEGORY  1: Negative. Electronically Signed   By: Dina  Arceo M.D.   On: 11/03/2024 14:38     ELIGIBLE FOR AVAILABLE RESEARCH PROTOCOL: no  ASSESSMENT: 79 y.o. [place] woman status post left breast upper outer quadrant biopsy 08/09/2019 for a clinical T1b N0, stage IA invasive ductal carcinoma, grade 2, estrogen and progesterone receptor positive, HER-2 not amplified, with an MIB-1 of 2%.  (1) status post left lumpectomy and sentinel lymph node sampling 08/31/2019 for a pT1a pN0, stage IA invasive ductal carcinoma, with negative margins  (2) adjuvant radiation 10/24/2019-11/18/2019 Site Technique Total Dose (Gy) Dose per Fx (Gy) Completed Fx Beam Energies  Breast, Left: Breast_Lt 3D 40.05/40.05 2.67 15/15 6X  Breast, Left: Breast_Lt_Bst 3D 10/10 2 5/5 6X   (3) started anastrozole  01/08/2020  (a) bone density 08/25/2019 shows a T score of -1.8 [facility])   PLAN:  Assessment and Plan Assessment & Plan Estrogen receptor positive breast cancer, status post five years of adjuvant therapy Remission after curative-intent therapy for stage IA, ER/PR positive, HER2 negative invasive ductal carcinoma. Transitioning to survivorship care. Manageable arthralgias and shoulder soreness from prior radiation.  - Complete current bottle of anastrozole , finish therapy in April 2026. - Transition to survivorship clinic for annual follow-up with nurse practitioner, then to primary care. - Recommend annual mammogram for surveillance. - Continue supportive care for arthralgias with diclofenac as needed. - Provided guidance on transition from oncology to primary care after survivorship follow-up.  Osteopenia Osteopenia progressing toward osteoporosis, likely due to anastrozole . Bone density worsened but remains osteopenic. Mild  hypercalcemia managed by endocrinology. Vitamin D  and calcium  supplementation discontinued. Bisphosphonate therapy deferred pending anastrozole  cessation. Emphasized weight-bearing exercise. Plan to monitor bone density post-anastrozole . - Continue weight-bearing and strength training exercises; use [employer] membership for instruction. - Follow up with endocrinology for hypercalcemia and bone health. - Plan repeat DEXA scan in 2027. - Survivorship clinic provider to monitor bone density and consider pharmacologic therapy post-anastrozole .   Total encounter time 30 minutes.DEWAINE Amber Stalls, MD   11/17/2024 9:56 AM Medical Oncology and Hematology [facility] [address] [place], [place] [address] Tel. [phone]    Fax. [phone]  *Total Encounter Time as defined by the Centers for Medicare and Medicaid Services includes, in addition to  the face-to-face time of a patient visit (documented in the note above) non-face-to-face time: obtaining and reviewing outside history, ordering and reviewing medications, tests or procedures, care coordination (communications with other health care professionals or caregivers) and documentation in the medical record. "

## 2025-11-17 ENCOUNTER — Inpatient Hospital Stay: Admitting: Adult Health

## 2025-11-17 ENCOUNTER — Inpatient Hospital Stay
